# Patient Record
Sex: Female | Born: 1935 | Race: White | Hispanic: No | Marital: Single | Smoking: Former smoker
Health system: Southern US, Community
[De-identification: ages and names within clinical notes are randomized; demographics above are authoritative.]

## PROBLEM LIST (undated history)

## (undated) DIAGNOSIS — D649 Anemia, unspecified: Secondary | ICD-10-CM

## (undated) DIAGNOSIS — IMO0001 Reserved for inherently not codable concepts without codable children: Secondary | ICD-10-CM

## (undated) DIAGNOSIS — E785 Hyperlipidemia, unspecified: Secondary | ICD-10-CM

## (undated) DIAGNOSIS — K922 Gastrointestinal hemorrhage, unspecified: Secondary | ICD-10-CM

## (undated) DIAGNOSIS — E7800 Pure hypercholesterolemia, unspecified: Secondary | ICD-10-CM

## (undated) DIAGNOSIS — I714 Abdominal aortic aneurysm, without rupture, unspecified: Secondary | ICD-10-CM

## (undated) DIAGNOSIS — H409 Unspecified glaucoma: Secondary | ICD-10-CM

## (undated) DIAGNOSIS — R238 Other skin changes: Secondary | ICD-10-CM

## (undated) DIAGNOSIS — F32A Depression, unspecified: Secondary | ICD-10-CM

## (undated) DIAGNOSIS — M1990 Unspecified osteoarthritis, unspecified site: Secondary | ICD-10-CM

## (undated) DIAGNOSIS — K2970 Gastritis, unspecified, without bleeding: Secondary | ICD-10-CM

## (undated) DIAGNOSIS — G629 Polyneuropathy, unspecified: Secondary | ICD-10-CM

## (undated) DIAGNOSIS — I10 Essential (primary) hypertension: Secondary | ICD-10-CM

## (undated) DIAGNOSIS — J84112 Idiopathic pulmonary fibrosis: Secondary | ICD-10-CM

## (undated) DIAGNOSIS — R4702 Dysphasia: Secondary | ICD-10-CM

## (undated) DIAGNOSIS — R51 Headache: Secondary | ICD-10-CM

## (undated) DIAGNOSIS — H269 Unspecified cataract: Secondary | ICD-10-CM

## (undated) DIAGNOSIS — K559 Vascular disorder of intestine, unspecified: Secondary | ICD-10-CM

## (undated) DIAGNOSIS — I639 Cerebral infarction, unspecified: Secondary | ICD-10-CM

## (undated) DIAGNOSIS — J449 Chronic obstructive pulmonary disease, unspecified: Secondary | ICD-10-CM

## (undated) DIAGNOSIS — N189 Chronic kidney disease, unspecified: Secondary | ICD-10-CM

## (undated) DIAGNOSIS — R233 Spontaneous ecchymoses: Secondary | ICD-10-CM

## (undated) DIAGNOSIS — K869 Disease of pancreas, unspecified: Secondary | ICD-10-CM

## (undated) DIAGNOSIS — F329 Major depressive disorder, single episode, unspecified: Secondary | ICD-10-CM

## (undated) DIAGNOSIS — I2510 Atherosclerotic heart disease of native coronary artery without angina pectoris: Secondary | ICD-10-CM

## (undated) DIAGNOSIS — K220 Achalasia of cardia: Secondary | ICD-10-CM

## (undated) DIAGNOSIS — M792 Neuralgia and neuritis, unspecified: Secondary | ICD-10-CM

## (undated) DIAGNOSIS — J189 Pneumonia, unspecified organism: Secondary | ICD-10-CM

## (undated) DIAGNOSIS — G43909 Migraine, unspecified, not intractable, without status migrainosus: Secondary | ICD-10-CM

## (undated) DIAGNOSIS — F419 Anxiety disorder, unspecified: Secondary | ICD-10-CM

## (undated) DIAGNOSIS — G459 Transient cerebral ischemic attack, unspecified: Secondary | ICD-10-CM

## (undated) DIAGNOSIS — H9190 Unspecified hearing loss, unspecified ear: Secondary | ICD-10-CM

## (undated) HISTORY — PX: TONSILLECTOMY: SUR1361

## (undated) HISTORY — DX: Hyperlipidemia, unspecified: E78.5

## (undated) HISTORY — DX: Transient cerebral ischemic attack, unspecified: G45.9

## (undated) HISTORY — DX: Essential (primary) hypertension: I10

## (undated) HISTORY — DX: Abdominal aortic aneurysm, without rupture: I71.4

## (undated) HISTORY — DX: Neuralgia and neuritis, unspecified: M79.2

## (undated) HISTORY — DX: Gastritis, unspecified, without bleeding: K29.70

## (undated) HISTORY — PX: DILATION AND CURETTAGE OF UTERUS: SHX78

## (undated) HISTORY — PX: CAROTID ENDARTERECTOMY: SUR193

## (undated) HISTORY — DX: Abdominal aortic aneurysm, without rupture, unspecified: I71.40

## (undated) HISTORY — DX: Anemia, unspecified: D64.9

## (undated) HISTORY — DX: Gastrointestinal hemorrhage, unspecified: K92.2

## (undated) HISTORY — DX: Vascular disorder of intestine, unspecified: K55.9

## (undated) HISTORY — DX: Unspecified osteoarthritis, unspecified site: M19.90

---
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
## 1973-06-16 HISTORY — PX: TUBAL LIGATION: SHX77

## 1982-06-16 HISTORY — PX: ABDOMINAL HYSTERECTOMY: SHX81

## 1998-03-06 ENCOUNTER — Other Ambulatory Visit: Admission: RE | Admit: 1998-03-06 | Discharge: 1998-03-06 | Payer: Self-pay | Admitting: Gynecology

## 1998-06-26 ENCOUNTER — Inpatient Hospital Stay (HOSPITAL_COMMUNITY): Admission: EM | Admit: 1998-06-26 | Discharge: 1998-07-02 | Payer: Self-pay | Admitting: Emergency Medicine

## 1998-08-27 ENCOUNTER — Encounter: Payer: Self-pay | Admitting: Vascular Surgery

## 1998-08-27 ENCOUNTER — Ambulatory Visit (HOSPITAL_COMMUNITY): Admission: RE | Admit: 1998-08-27 | Discharge: 1998-08-27 | Payer: Self-pay | Admitting: Vascular Surgery

## 1998-08-30 ENCOUNTER — Inpatient Hospital Stay: Admission: RE | Admit: 1998-08-30 | Discharge: 1998-08-31 | Payer: Self-pay | Admitting: Vascular Surgery

## 1999-04-15 ENCOUNTER — Encounter: Admission: RE | Admit: 1999-04-15 | Discharge: 1999-04-15 | Payer: Self-pay | Admitting: Neurosurgery

## 1999-04-16 ENCOUNTER — Encounter: Payer: Self-pay | Admitting: Vascular Surgery

## 1999-04-17 ENCOUNTER — Encounter (INDEPENDENT_AMBULATORY_CARE_PROVIDER_SITE_OTHER): Payer: Self-pay | Admitting: Specialist

## 1999-04-17 ENCOUNTER — Inpatient Hospital Stay: Admission: RE | Admit: 1999-04-17 | Discharge: 1999-04-19 | Payer: Self-pay | Admitting: Vascular Surgery

## 1999-09-06 ENCOUNTER — Other Ambulatory Visit: Admission: RE | Admit: 1999-09-06 | Discharge: 1999-09-06 | Payer: Self-pay | Admitting: Gynecology

## 1999-10-03 ENCOUNTER — Encounter: Payer: Self-pay | Admitting: Neurology

## 1999-10-03 ENCOUNTER — Ambulatory Visit (HOSPITAL_COMMUNITY): Admission: RE | Admit: 1999-10-03 | Discharge: 1999-10-03 | Payer: Self-pay | Admitting: Neurology

## 2000-06-16 HISTORY — PX: COLON SURGERY: SHX602

## 2000-07-05 ENCOUNTER — Encounter: Payer: Self-pay | Admitting: Family Medicine

## 2000-07-05 ENCOUNTER — Encounter (INDEPENDENT_AMBULATORY_CARE_PROVIDER_SITE_OTHER): Payer: Self-pay | Admitting: Specialist

## 2000-07-05 ENCOUNTER — Inpatient Hospital Stay (HOSPITAL_COMMUNITY): Admission: EM | Admit: 2000-07-05 | Discharge: 2000-07-23 | Payer: Self-pay | Admitting: Emergency Medicine

## 2000-07-15 ENCOUNTER — Encounter: Payer: Self-pay | Admitting: General Surgery

## 2000-07-18 ENCOUNTER — Encounter: Payer: Self-pay | Admitting: Internal Medicine

## 2000-07-23 ENCOUNTER — Inpatient Hospital Stay: Admission: RE | Admit: 2000-07-23 | Discharge: 2000-07-30 | Payer: Self-pay | Admitting: Internal Medicine

## 2000-07-27 ENCOUNTER — Ambulatory Visit (HOSPITAL_COMMUNITY): Admission: RE | Admit: 2000-07-27 | Discharge: 2000-07-27 | Payer: Self-pay | Admitting: Internal Medicine

## 2000-07-27 ENCOUNTER — Encounter: Payer: Self-pay | Admitting: Orthopedic Surgery

## 2000-11-17 ENCOUNTER — Other Ambulatory Visit: Admission: RE | Admit: 2000-11-17 | Discharge: 2000-11-17 | Payer: Self-pay | Admitting: Gynecology

## 2002-04-22 ENCOUNTER — Other Ambulatory Visit: Admission: RE | Admit: 2002-04-22 | Discharge: 2002-04-22 | Payer: Self-pay | Admitting: Internal Medicine

## 2003-10-03 ENCOUNTER — Other Ambulatory Visit: Admission: RE | Admit: 2003-10-03 | Discharge: 2003-10-03 | Payer: Self-pay | Admitting: Gynecology

## 2004-01-05 ENCOUNTER — Other Ambulatory Visit: Admission: RE | Admit: 2004-01-05 | Discharge: 2004-01-05 | Payer: Self-pay | Admitting: Gynecology

## 2004-10-03 ENCOUNTER — Encounter: Admission: RE | Admit: 2004-10-03 | Discharge: 2004-10-03 | Payer: Self-pay | Admitting: Family Medicine

## 2004-11-08 ENCOUNTER — Inpatient Hospital Stay (HOSPITAL_COMMUNITY): Admission: EM | Admit: 2004-11-08 | Discharge: 2004-11-22 | Payer: Self-pay | Admitting: Emergency Medicine

## 2005-03-04 ENCOUNTER — Other Ambulatory Visit: Admission: RE | Admit: 2005-03-04 | Discharge: 2005-03-04 | Payer: Self-pay | Admitting: Gynecology

## 2005-10-28 ENCOUNTER — Encounter: Admission: RE | Admit: 2005-10-28 | Discharge: 2005-10-28 | Payer: Self-pay | Admitting: Family Medicine

## 2006-05-26 ENCOUNTER — Other Ambulatory Visit: Admission: RE | Admit: 2006-05-26 | Discharge: 2006-05-26 | Payer: Self-pay | Admitting: Gynecology

## 2006-11-27 ENCOUNTER — Encounter: Admission: RE | Admit: 2006-11-27 | Discharge: 2006-11-27 | Payer: Self-pay | Admitting: Family Medicine

## 2007-12-16 ENCOUNTER — Inpatient Hospital Stay (HOSPITAL_COMMUNITY): Admission: EM | Admit: 2007-12-16 | Discharge: 2007-12-21 | Payer: Self-pay | Admitting: Emergency Medicine

## 2007-12-17 ENCOUNTER — Ambulatory Visit: Payer: Self-pay | Admitting: Physical Medicine & Rehabilitation

## 2008-02-15 HISTORY — PX: FRACTURE SURGERY: SHX138

## 2008-02-28 ENCOUNTER — Inpatient Hospital Stay (HOSPITAL_COMMUNITY): Admission: RE | Admit: 2008-02-28 | Discharge: 2008-03-02 | Payer: Self-pay | Admitting: Orthopedic Surgery

## 2008-03-16 HISTORY — PX: FRACTURE SURGERY: SHX138

## 2008-04-04 ENCOUNTER — Inpatient Hospital Stay (HOSPITAL_COMMUNITY): Admission: EM | Admit: 2008-04-04 | Discharge: 2008-04-12 | Payer: Self-pay | Admitting: Emergency Medicine

## 2008-04-11 ENCOUNTER — Ambulatory Visit: Payer: Self-pay | Admitting: Physical Medicine & Rehabilitation

## 2008-04-12 ENCOUNTER — Ambulatory Visit: Payer: Self-pay | Admitting: Physical Medicine & Rehabilitation

## 2008-04-12 ENCOUNTER — Inpatient Hospital Stay (HOSPITAL_COMMUNITY)
Admission: RE | Admit: 2008-04-12 | Discharge: 2008-04-25 | Payer: Self-pay | Admitting: Physical Medicine & Rehabilitation

## 2008-04-16 HISTORY — PX: FRACTURE SURGERY: SHX138

## 2008-08-18 ENCOUNTER — Inpatient Hospital Stay (HOSPITAL_COMMUNITY): Admission: EM | Admit: 2008-08-18 | Discharge: 2008-08-21 | Payer: Self-pay | Admitting: Emergency Medicine

## 2009-04-09 ENCOUNTER — Encounter: Admission: RE | Admit: 2009-04-09 | Discharge: 2009-04-09 | Payer: Self-pay | Admitting: Family Medicine

## 2010-04-19 ENCOUNTER — Encounter: Admission: RE | Admit: 2010-04-19 | Discharge: 2010-04-19 | Payer: Self-pay | Admitting: Family Medicine

## 2010-05-03 ENCOUNTER — Ambulatory Visit: Payer: Self-pay | Admitting: Vascular Surgery

## 2010-07-18 ENCOUNTER — Emergency Department (HOSPITAL_COMMUNITY): Payer: Medicare Other

## 2010-07-18 ENCOUNTER — Emergency Department (HOSPITAL_COMMUNITY)
Admission: EM | Admit: 2010-07-18 | Discharge: 2010-07-18 | Disposition: A | Discharge status: Home / Self Care | Payer: Medicare Other | Attending: Emergency Medicine | Admitting: Emergency Medicine

## 2010-07-18 ENCOUNTER — Encounter (HOSPITAL_COMMUNITY): Payer: Self-pay | Admitting: Radiology

## 2010-07-18 DIAGNOSIS — I714 Abdominal aortic aneurysm, without rupture, unspecified: Secondary | ICD-10-CM | POA: Insufficient documentation

## 2010-07-18 DIAGNOSIS — H538 Other visual disturbances: Secondary | ICD-10-CM | POA: Insufficient documentation

## 2010-07-18 DIAGNOSIS — I10 Essential (primary) hypertension: Secondary | ICD-10-CM | POA: Insufficient documentation

## 2010-07-18 DIAGNOSIS — Z8679 Personal history of other diseases of the circulatory system: Secondary | ICD-10-CM | POA: Insufficient documentation

## 2010-07-18 DIAGNOSIS — N289 Disorder of kidney and ureter, unspecified: Secondary | ICD-10-CM | POA: Insufficient documentation

## 2010-07-18 DIAGNOSIS — R109 Unspecified abdominal pain: Secondary | ICD-10-CM | POA: Insufficient documentation

## 2010-07-18 DIAGNOSIS — R51 Headache: Secondary | ICD-10-CM | POA: Insufficient documentation

## 2010-07-18 DIAGNOSIS — R1013 Epigastric pain: Secondary | ICD-10-CM | POA: Insufficient documentation

## 2010-07-18 LAB — POCT I-STAT, CHEM 8
Calcium, Ion: 0.96 mmol/L — ABNORMAL LOW (ref 1.12–1.32)
Creatinine, Ser: 1.1 mg/dL (ref 0.4–1.2)
Hemoglobin: 13.6 g/dL (ref 12.0–15.0)
Sodium: 132 mEq/L — ABNORMAL LOW (ref 135–145)
TCO2: 24 mmol/L (ref 0–100)

## 2010-07-18 LAB — DIFFERENTIAL
Basophils Absolute: 0.1 10*3/uL (ref 0.0–0.1)
Eosinophils Absolute: 0.1 10*3/uL (ref 0.0–0.7)
Lymphocytes Relative: 40 % (ref 12–46)
Lymphs Abs: 2.5 10*3/uL (ref 0.7–4.0)
Monocytes Absolute: 0.7 10*3/uL (ref 0.1–1.0)
Neutro Abs: 2.8 10*3/uL (ref 1.7–7.7)

## 2010-07-18 LAB — COMPREHENSIVE METABOLIC PANEL
ALT: 22 U/L (ref 0–35)
AST: 34 U/L (ref 0–37)
Alkaline Phosphatase: 86 U/L (ref 39–117)
CO2: 23 mEq/L (ref 19–32)
GFR calc Af Amer: 28 mL/min — ABNORMAL LOW (ref >60)
GFR calc non Af Amer: 23 mL/min — ABNORMAL LOW (ref >60)
Glucose, Bld: 92 mg/dL (ref 70–99)
Potassium: 4.9 mEq/L (ref 3.5–5.1)
Sodium: 135 mEq/L (ref 135–145)

## 2010-07-18 LAB — URINALYSIS, ROUTINE W REFLEX MICROSCOPIC
Bilirubin Urine: NEGATIVE
Ketones, ur: 15 mg/dL — AB
Nitrite: NEGATIVE
Protein, ur: 30 mg/dL — AB
Urine Glucose, Fasting: NEGATIVE mg/dL

## 2010-07-18 LAB — CBC
MCH: 29.4 pg (ref 26.0–34.0)
MCHC: 33.1 g/dL (ref 30.0–36.0)
Platelets: 150 10*3/uL (ref 150–400)
RDW: 15.7 % — ABNORMAL HIGH (ref 11.5–15.5)

## 2010-07-18 LAB — URINE MICROSCOPIC-ADD ON

## 2010-07-18 LAB — POCT CARDIAC MARKERS

## 2010-07-18 MED ORDER — IOHEXOL 300 MG/ML  SOLN: 100.0000 mL | Freq: Once | INTRAMUSCULAR | Status: DC | PRN

## 2010-08-15 ENCOUNTER — Emergency Department (HOSPITAL_COMMUNITY)
Admission: EM | Admit: 2010-08-15 | Discharge: 2010-08-15 | Disposition: A | Discharge status: Home / Self Care | Payer: Medicare Other | Attending: Emergency Medicine | Admitting: Emergency Medicine

## 2010-08-15 DIAGNOSIS — I10 Essential (primary) hypertension: Secondary | ICD-10-CM | POA: Insufficient documentation

## 2010-08-15 DIAGNOSIS — R197 Diarrhea, unspecified: Secondary | ICD-10-CM | POA: Insufficient documentation

## 2010-08-15 DIAGNOSIS — Z8673 Personal history of transient ischemic attack (TIA), and cerebral infarction without residual deficits: Secondary | ICD-10-CM | POA: Insufficient documentation

## 2010-08-15 DIAGNOSIS — R209 Unspecified disturbances of skin sensation: Secondary | ICD-10-CM | POA: Insufficient documentation

## 2010-08-15 DIAGNOSIS — R29898 Other symptoms and signs involving the musculoskeletal system: Secondary | ICD-10-CM | POA: Insufficient documentation

## 2010-08-15 LAB — DIFFERENTIAL
Basophils Relative: 0 % (ref 0–1)
Monocytes Relative: 8 % (ref 3–12)
Neutro Abs: 2.9 10*3/uL (ref 1.7–7.7)
Neutrophils Relative %: 38 % — ABNORMAL LOW (ref 43–77)

## 2010-08-15 LAB — COMPREHENSIVE METABOLIC PANEL
ALT: 28 U/L (ref 0–35)
AST: 28 U/L (ref 0–37)
Alkaline Phosphatase: 103 U/L (ref 39–117)
CO2: 28 mEq/L (ref 19–32)
Chloride: 104 mEq/L (ref 96–112)
Creatinine, Ser: 1.49 mg/dL — ABNORMAL HIGH (ref 0.4–1.2)
GFR calc Af Amer: 41 mL/min — ABNORMAL LOW (ref >60)
GFR calc non Af Amer: 34 mL/min — ABNORMAL LOW (ref >60)
Potassium: 4.4 mEq/L (ref 3.5–5.1)
Sodium: 140 mEq/L (ref 135–145)
Total Bilirubin: 0.5 mg/dL (ref 0.3–1.2)

## 2010-08-15 LAB — CBC
Hemoglobin: 15 g/dL (ref 12.0–15.0)
MCH: 29.7 pg (ref 26.0–34.0)
RBC: 5.05 MIL/uL (ref 3.87–5.11)
WBC: 7.6 10*3/uL (ref 4.0–10.5)

## 2010-09-03 ENCOUNTER — Emergency Department (HOSPITAL_COMMUNITY)
Admission: EM | Admit: 2010-09-03 | Discharge: 2010-09-03 | Disposition: A | Discharge status: Home / Self Care | Payer: Medicare Other | Attending: Emergency Medicine | Admitting: Emergency Medicine

## 2010-09-03 DIAGNOSIS — Z8673 Personal history of transient ischemic attack (TIA), and cerebral infarction without residual deficits: Secondary | ICD-10-CM | POA: Insufficient documentation

## 2010-09-03 DIAGNOSIS — Z79899 Other long term (current) drug therapy: Secondary | ICD-10-CM | POA: Insufficient documentation

## 2010-09-03 DIAGNOSIS — R55 Syncope and collapse: Secondary | ICD-10-CM | POA: Insufficient documentation

## 2010-09-03 DIAGNOSIS — I10 Essential (primary) hypertension: Secondary | ICD-10-CM | POA: Insufficient documentation

## 2010-09-03 DIAGNOSIS — R42 Dizziness and giddiness: Secondary | ICD-10-CM | POA: Insufficient documentation

## 2010-09-03 LAB — POCT I-STAT, CHEM 8
Calcium, Ion: 1.1 mmol/L — ABNORMAL LOW (ref 1.12–1.32)
Glucose, Bld: 110 mg/dL — ABNORMAL HIGH (ref 70–99)
HCT: 45 % (ref 36.0–46.0)
Hemoglobin: 15.3 g/dL — ABNORMAL HIGH (ref 12.0–15.0)
TCO2: 26 mmol/L (ref 0–100)

## 2010-09-03 LAB — POCT CARDIAC MARKERS

## 2010-09-17 ENCOUNTER — Other Ambulatory Visit: Payer: Self-pay | Admitting: Gastroenterology

## 2010-09-26 ENCOUNTER — Encounter: Payer: Self-pay | Admitting: Vascular Surgery

## 2010-09-26 LAB — PROTIME-INR
INR: 1 (ref 0.00–1.49)
Prothrombin Time: 12.9 seconds (ref 11.6–15.2)

## 2010-09-26 LAB — BASIC METABOLIC PANEL
BUN: 11 mg/dL (ref 6–23)
BUN: 14 mg/dL (ref 6–23)
BUN: 16 mg/dL (ref 6–23)
BUN: 18 mg/dL (ref 6–23)
CO2: 24 mEq/L (ref 19–32)
Calcium: 8.5 mg/dL (ref 8.4–10.5)
Chloride: 101 mEq/L (ref 96–112)
Creatinine, Ser: 1.09 mg/dL (ref 0.4–1.2)
Creatinine, Ser: 1.23 mg/dL — ABNORMAL HIGH (ref 0.4–1.2)
GFR calc Af Amer: 52 mL/min — ABNORMAL LOW (ref >60)
GFR calc non Af Amer: 43 mL/min — ABNORMAL LOW (ref >60)
GFR calc non Af Amer: 49 mL/min — ABNORMAL LOW (ref >60)
Glucose, Bld: 115 mg/dL — ABNORMAL HIGH (ref 70–99)
Glucose, Bld: 71 mg/dL (ref 70–99)
Glucose, Bld: 80 mg/dL (ref 70–99)
Potassium: 4.1 mEq/L (ref 3.5–5.1)
Potassium: 4.5 mEq/L (ref 3.5–5.1)
Potassium: 4.6 mEq/L (ref 3.5–5.1)
Sodium: 135 mEq/L (ref 135–145)

## 2010-09-26 LAB — URINALYSIS, ROUTINE W REFLEX MICROSCOPIC
Glucose, UA: NEGATIVE mg/dL
Hgb urine dipstick: NEGATIVE
Leukocytes, UA: NEGATIVE
Protein, ur: 30 mg/dL — AB
Specific Gravity, Urine: 1.018 (ref 1.005–1.030)
pH: 8 (ref 5.0–8.0)

## 2010-09-26 LAB — POCT I-STAT, CHEM 8
Calcium, Ion: 1.22 mmol/L (ref 1.12–1.32)
Creatinine, Ser: 1.2 mg/dL (ref 0.4–1.2)
Glucose, Bld: 123 mg/dL — ABNORMAL HIGH (ref 70–99)
HCT: 49 % — ABNORMAL HIGH (ref 36.0–46.0)
Hemoglobin: 16.7 g/dL — ABNORMAL HIGH (ref 12.0–15.0)
Potassium: 5.4 mEq/L — ABNORMAL HIGH (ref 3.5–5.1)
TCO2: 32 mmol/L (ref 0–100)

## 2010-09-26 LAB — URINE MICROSCOPIC-ADD ON

## 2010-09-26 LAB — DIFFERENTIAL
Basophils Absolute: 0 10*3/uL (ref 0.0–0.1)
Basophils Absolute: 0 10*3/uL (ref 0.0–0.1)
Basophils Relative: 0 % (ref 0–1)
Eosinophils Absolute: 0.1 10*3/uL (ref 0.0–0.7)
Eosinophils Relative: 1 % (ref 0–5)
Lymphocytes Relative: 16 % (ref 12–46)
Lymphocytes Relative: 17 % (ref 12–46)
Monocytes Absolute: 0.8 10*3/uL (ref 0.1–1.0)
Neutro Abs: 10.7 10*3/uL — ABNORMAL HIGH (ref 1.7–7.7)
Neutrophils Relative %: 76 % (ref 43–77)
Neutrophils Relative %: 77 % (ref 43–77)

## 2010-09-26 LAB — CBC
HCT: 43.8 % (ref 36.0–46.0)
HCT: 45.8 % (ref 36.0–46.0)
Hemoglobin: 15.4 g/dL — ABNORMAL HIGH (ref 12.0–15.0)
MCHC: 33.9 g/dL (ref 30.0–36.0)
MCV: 86.8 fL (ref 78.0–100.0)
Platelets: 296 10*3/uL (ref 150–400)
Platelets: 314 10*3/uL (ref 150–400)
RDW: 16.1 % — ABNORMAL HIGH (ref 11.5–15.5)
RDW: 16.3 % — ABNORMAL HIGH (ref 11.5–15.5)
WBC: 11.9 10*3/uL — ABNORMAL HIGH (ref 4.0–10.5)
WBC: 11.9 10*3/uL — ABNORMAL HIGH (ref 4.0–10.5)

## 2010-09-26 LAB — COMPREHENSIVE METABOLIC PANEL
Albumin: 3.3 g/dL — ABNORMAL LOW (ref 3.5–5.2)
BUN: 12 mg/dL (ref 6–23)
Chloride: 100 mEq/L (ref 96–112)
Creatinine, Ser: 1.18 mg/dL (ref 0.4–1.2)
Glucose, Bld: 133 mg/dL — ABNORMAL HIGH (ref 70–99)
Total Bilirubin: 0.6 mg/dL (ref 0.3–1.2)
Total Protein: 6.2 g/dL (ref 6.0–8.3)

## 2010-09-26 LAB — URINE CULTURE: Colony Count: NO GROWTH

## 2010-09-26 LAB — LACTIC ACID, PLASMA: Lactic Acid, Venous: 1.6 mmol/L (ref 0.5–2.2)

## 2010-09-26 LAB — APTT: aPTT: 28 seconds (ref 24–37)

## 2010-10-25 ENCOUNTER — Ambulatory Visit: Payer: Self-pay | Admitting: Vascular Surgery

## 2010-10-25 ENCOUNTER — Other Ambulatory Visit: Payer: Self-pay

## 2010-10-29 NOTE — Discharge Summary (Signed)
[name], [name]                 ACCOUNT NO.:  [id number]   MEDICAL RECORD NO.:  [id number]          [name] TYPE:  INP   LOCATION:  5003                         FACILITY:  [name]   PHYSICIAN:  [name] [name], M.D.   DATE OF BIRTH:  Dec 04, 1933   DATE OF ADMISSION:  02/28/2008  DATE OF DISCHARGE:  03/02/2008                               DISCHARGE SUMMARY   CHIEF COMPLAINT:  Left hip pain.   HISTORY OF PRESENT ILLNESS:  This is a 77 year old lady who fell in July  of this year and sustained a left hip fracture.  She underwent an open  reduction and internal fixation of [name] fracture, but has had nonunion  and complains of persistent left hip pain.  She desires a surgical  intervention at this time, all risks and benefits of surgery was  discussed with [name] [name].   PAST MEDICAL HISTORY:  Significant for hypertension and a subdural  hematoma in 2000.   PAST SURGICAL HISTORY:  Significant for no ORIF of [name] left hip fracture  in July 2009.  She had a partial colectomy in 2002 and a left  nephrectomy hysterectomy at age 39.   SOCIAL HISTORY:  She does not drink alcohol.  She smokes one cigarettes  a day.   FAMILY HISTORY:  Noncontributory.   ALLERGIES:  PENICILLIN, MACROLIDE, CODEINE.   CURRENT MEDICATIONS:  1. Aspirin 325 mg one p.o. daily.  2. Zyrtec 10 mg one p.o. daily Tri-Chlor 160 mg one p.o. daily Lyrica      75 mg one p.o. b.i.d. Lopressor 50 mg one p.o. b.i.d. Toprol,      Lipitor 20 mg daily, Robaxin 500 mg one p.o. q.6 h p.r.n. spasm.   PHYSICAL EXAMINATION:  Examination of [name] left hip demonstrates [name]  [name] to have tenderness with range of motion and positive foot tap.  She is neurovascularly intact.  X-rays demonstrate a migration of [name]  nail through [name] subcondylar bone and a nonunion of [name] hip fracture.   PREOPERATIVE LABORATORIES:  White blood cells 9.3, red blood cells 4.64,  hemoglobin 13.7, hematocrit 41.5, platelets 357.  PT 12.8, INR 1.0, PTT  26.   Sodium 139, potassium 4.4, chloride 103, glucose 56, BUN 23,  creatinine 1.81.  Urinalysis demonstrated moderate leukocytes, but is  otherwise within normal limits.   HOSPITAL COURSE:  [name] [name] was admitted February 28, 2008, to [name], and she underwent left total hip arthroplasty after removal of  her previously placed hardware.  Perioperative Foley catheter was placed  and she was transferred to [name].  She was maintained on  her home aspirin for DVT prophylaxis.  Later that day, her hemoglobin  was found to be at 8.3, so she was transfused with 2 units of packed red  blood cells.  On [name] first postoperative day, [name] [name] was awake and  alert.  Her hemoglobin had increased to 10.8 after [name] transfusion.  Her  potassium was 5.7.  Her IV fluids were continued, and her potassium was  rechecked 8 hours later and found to be 4.6.  [name] [name] was evaluated  by physical therapy.  On [name] second postoperative day, [name] [name] was  awake and alert and denied any nausea or vomiting.  She was complaining  of muscle spasms in her left side that were relieved with Robaxin.  Hemoglobin was 9.6, her potassium was 4.6.  [name] [name] worked on bed to  chair transfers with physical therapy.  On postoperative day #3, [name]  [name] was awake and alert and tolerating p.o. intake well.  She was  working with physical therapy.  Her hemoglobin was 9.9.  She was  discharged to [name] for skilled nursing care.   DISPOSITION:  [name] [name] will be discharged to [name]  on March 02, 2008.  Skilled nursing will manage her wound and her  physical therapy.  She will continue taking her home aspirin for DVT  prophylaxis.   DISCHARGE MEDICINES:  As per [name] __________with [name] addition of Percocet  5 mg tablets 1-2 tablets p.o. q.4 h p.r.n. pain.  She will be  weightbearing as tolerated with a walker, will return to [name] clinic to  see Dr. Turner [name] in one  week for reevaluation.   FINAL DIAGNOSIS:  A failed left hip open reduction and internal  fixation.   SECONDARY DIAGNOSES:  Acute blood loss anemia and hyperkalemia.      [name] Harris, PA      [name] [name], M.D.  Electronically Signed    JW/MEDQ  D:  03/02/2008  T:  03/02/2008  Job:  [id number]

## 2010-10-29 NOTE — Discharge Summary (Signed)
[name]                 ACCOUNT NO.:  [id number]   MEDICAL RECORD NO.:  [id number]          PATIENT TYPE:  INP   LOCATION:  1608                         FACILITY:  [facility]   PHYSICIAN:  Feliberto Gottron. Turner Daniels, M.D.   DATE OF BIRTH:  18-Jun-1933   DATE OF ADMISSION:  12/16/2007  DATE OF DISCHARGE:  12/21/2007                               DISCHARGE SUMMARY   CHIEF COMPLAINT:  Left hip pain.   HISTORY OF PRESENT ILLNESS:  This is a 77 year old lady who sustained a  left hip fracture after falling in her home on the afternoon of July 2.  The fall was likely secondary to chronic neuropathy in her lower  extremities.  She was evaluated at the [facility] emergency room by Dr.  Turner Daniels and determined to need surgery, so she was admitted for this.   PAST MEDICAL HISTORY:  1. Significant for hypertension.  2. Restless legs syndrome  3. She had a subdural hematoma in 2000.   FAMILY HISTORY:  Noncontributory.   SOCIAL HISTORY:  She does not drink alcohol.  She is a nonsmoker.  She  lives with a friend.   CURRENT MEDICATIONS:  1. Lipitor 20 mg 1 p.o. daily.  2. Imipramine 25 mg 1 p.o. nightly.  3. Lyrica 75 mg 1 p.o. b.i.d.  4. Metoprolol 50 mg 1 p.o. b.i.d.  5. Zyrtec 1 p.o. daily.  6. Aspirin 1 p.o. daily.   DRUG ALLERGIES:  PENICILLIN, MACROLIDES, CODEINE and MYCINS.   PHYSICAL EXAM:  Guarded examination of the left lower extremity  demonstrated this extremity to be shorter and externally rotated with  decreased sensation to light touch.   X-rays taken of the left hip demonstrate an angulated fracture that is  intertrochanteric, subtrochanteric.   LABORATORY DATA:  Sodium 142, potassium 4.8, chloride 103, carbon  dioxide 32, glucose 110, BUN 22, creatinine 1.29.  White blood cells  9.1, red blood cells 5.08, hemoglobin 15.6, hematocrit 47, platelets  223.   HOSPITAL COURSE:  [name] was admitted through the [facility] ER on the  evening of December 16, 2007, after being evaluated  by Dr. Gean Birchwood and  determined to be a candidate for surgery.  That evening she underwent an  open reduction and internal fixation of a left subtrochanteric and  intertrochanteric fracture using a DePuy trochanteric nail.  She  tolerated this procedure well and was admitted to the hospital.  Throughout her hospital stay [name] was managed with aspirin to prevent  blood clots due to her history of a subdural hematoma.  On the first  postoperative day the patient denied any chest pain or shortness of  breath.  She stated that she was taking p.o. fluids well and reported  moderate hip pain.  She moved from the bed to the recliner with physical  therapy.  On the second postoperative day the patient continued to  tolerate p.o. intake well and worked on transferring from the walker to  the bed and the bed to the chair with physical therapy.  On the third  postoperative day the patient continued  to work well with physical  therapy.  She was eating well and continued to deny any chest pain or  shortness of breath.  On the fourth postoperative day the patient  reported minimal hip pain at rest but stated that it became more  significant with ambulation.  She again denied shortness of breath or  chest pain.  No swelling was noted in the lower extremity.  Her Foley  catheter was removed.  On the fifth postoperative day the patient was  doing very well.  She was tolerating p.o. intake well and worked well  with physical therapy, so she was discharged to a skilled nursing  facility for rehab.   DISPOSITION:  The patient will be discharged to a skilled nursing  facility on December 21, 2007.  The facility will manage her wound and her  physical therapy.  She will be partial weightbearing approximately 30  pounds with a walker.  Her discharge medicines will be as per the HMR.  She will continue using her daily aspirin for blood clot prevention.  She will return to the clinic to see Dr. Turner Daniels in 1  week.   FINAL DIAGNOSIS:  Left hip fracture.      Shirl Harris, PA      Feliberto Gottron. Turner Daniels, M.D.  Electronically Signed    JW/MEDQ  D:  12/21/2007  T:  12/21/2007  Job:  [id number]

## 2010-10-29 NOTE — Consult Note (Signed)
[name], [name]                 ACCOUNT NO.:  [id number]   MEDICAL RECORD NO.:  [id number]          PATIENT TYPE:  INP   LOCATION:  5015                         FACILITY:  [facility]   PHYSICIAN:  Corinna L. Lendell Caprice, MDDATE OF BIRTH:  06-23-1933   DATE OF CONSULTATION:  04/06/2008  DATE OF DISCHARGE:                                 CONSULTATION   REQUESTING PHYSICIAN:  Harvie Junior, MD   REASON FOR CONSULTATION:  Cardiac and medical issues   IMPRESSION/RECOMMENDATIONS:  1. Left bundle-branch block, old:  No further workup needed at this      time.  The patient does not have a history of coronary artery      disease and has no chest pain currently.  2. Hypertension:  Resume metoprolol.  There is some confusion about      her dose and the patient's son will bring in her home medications      for clarification.  According to discharge summary on March 02, 2008, she was taking metoprolol 50 mg twice a day.  3. Hyperlipidemia:  Resume Lipitor.  4. Peripheral neuropathy:  Resume Lyrica.  5. Restless leg syndrome.  6. History of subdural hematoma and subarachnoid hemorrhage in 2000.  7. Left distal femur fracture status post open reduction and internal      fixation:  Agree with DVT prophylaxis.   HISTORY OF PRESENT ILLNESS:  [name] is a pleasant 77 year old white  female patient of Dr. Arvilla Market who fell and broke her leg.  She had  surgery on April 05, 2008, and we were consulted postoperatively to  assist with medical issues.  She was noted to have a left bundle-branch  block on her EKG.  Currently, she has no nausea, her pain is well  controlled, she has no shortness of breath or chest pain.   PAST MEDICAL HISTORY:  As above.   MEDICATIONS:  1. Tylenol 650 mg p.o. q.i.d.  2. Colace 100 mg p.o. b.i.d.  3. Iron b.i.d.  4. Warfarin per pharmacy protocol.  5. Metoprolol 25 mg a day.  6. Claritin 10 mg a day.  7. Vancomycin for 24 hours.  8. Morphine PCA.   ALLERGIES:  Reportedly CODEINE, PENICILLIN and MACROLIDES cause rash.   SOCIAL HISTORY:  The patient does not smoke.  No history of heavy  drinking.   Family history is noncontributory.   REVIEW OF SYSTEMS:  As above, otherwise, negative.   PHYSICAL EXAMINATION:  VITAL SIGNS:  Her temperature is 98.1, pulse 99,  respiratory rate 18, blood pressure 145/73, oxygen saturation 93% on 3 L  nasal cannula.  GENERAL:  The patient is a sleepy white female in no acute distress.  HEENT:  Normocephalic, atraumatic.  Pupils equal, round, and reactive to  light.  Dry mucous membranes.  NECK:  Supple.  LUNGS:  Clear to auscultation bilaterally without wheezes, rhonchi, or  rales.  CARDIOVASCULAR:  Regular rate and rhythm without murmurs, gallops, or  rubs.  ABDOMEN:  Normal bowel sounds, soft, nontender, nondistended.  GU AND RECTAL:  Deferred.  EXTREMITIES:  She has a brace on her left leg.  Pulses are intact.  No  edema.  No calf tenderness on the right.  NEUROLOGIC:  Sleepy but oriented.  Sensory motor exam is grossly intact.  SKIN:  No rash.   LABORATORY DATA:  CBC is significant for hemoglobin of 10.1, hematocrit  30.  Basic metabolic panel unremarkable.  EKG shows left bundle-branch  block, which is unchanged from previous.  Left knee x-ray shows  displaced distal femoral shaft fracture.  X-ray of the left tib-fib  shows nothing acute.  Left femur x-ray shows distal left femoral shaft  fracture with significant displacement and overlapping of fracture  fragments.  Chest x-ray shows pulmonary hyperaeration, nothing acute.   Thank you Dr. Luiz Blare for this consult.  We will continue to follow.      Corinna L. Lendell Caprice, MD  Electronically Signed     CLS/MEDQ  D:  04/07/2008  T:  04/07/2008  Job:  [id number]   cc:   Donia Guiles, M.D.

## 2010-10-29 NOTE — H&P (Signed)
[name], [name] NO.:  [id number]   MEDICAL RECORD NO.:  [id number]          PATIENT TYPE:  IPS   LOCATION:  4011                         FACILITY:  [facility]   PHYSICIAN:  Ranelle Oyster, M.D.DATE OF BIRTH:  1933/08/26   DATE OF ADMISSION:  04/12/2008  DATE OF DISCHARGE:                              HISTORY & PHYSICAL   CHIEF COMPLAINT:  Left hip pain.   HISTORY OF PRESENT ILLNESS:  This is a 77 year old white female with  history of iatrogenic peripheral neuropathy who had previous a left  intertrochanteric hip fracture in July and had a subsequent ORIF.  She  went to a skilled nursing facility.  The patient failed her left hip and  in September 2009 was converted to left hip arthroplasty and discharged  to [facility].  She returned home on April 02, 2008, and  had a fall on April 04, 2008, sustaining a left periprosthetic femur  fracture.  She underwent an ORIF of the left femur fracture on April 05, 2008, by Dr. Delorise Shiner.  She is toe down weightbearing with posterior  hip precautions and is in the knee immobilizer for stability.  She  continues to have issues related to the hip pain as well as her  neuropathy.  She is evaluated by rehab who felt that she could benefit  from an inpatient course and the patient subsequently was admitted  today.   REVIEW OF SYSTEMS:  Notable for falls as well as restless leg and  neuropathic pain.  She has constipation at home requiring stool  softeners and laxatives.  Full review is in the written H&P.   PAST MEDICAL HISTORY:  1. Positive hypertension.  2. Left bundle-branch block.  3. Hypokalemia.  4. Restless leg syndrome subdural in 2000 after a fall.  5. Peripheral neuropathy.  6. Left intertrochanteric hip fracture in July 2009.  7. 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   She does not drink or smoke.   FAMILY HISTORY:  Noncontributory.   SOCIAL HISTORY:  She lives with a female  companion x25 years.  A son is in  the area, but works.  Roommate can assist.  She has one-level house with  ramp.   FUNCTIONAL HISTORY:  The patient is independent with cane, walker, and  motorized scooter prior to arrival.  At the rehab consultation, the  patient was mod assist for transferring, basic mobility, and self-care.  The patient essentially was at the same level upon transfer today.   ALLERGIES:  PENICILLIN, CODEINE, MACROLIDES.   HOME MEDICATIONS:  Aspirin, Lipitor, Lopressor, Zyrtec.   LABORATORY DATA:  Hemoglobin 9.9, white count 7, platelets 417.  Sodium  139, potassium 4.3, BUN and creatinine 12 and 0.9.   PHYSICAL EXAMINATION:  VITAL SIGNS:  Blood pressure is 127/62, pulse 87,  respiratory rate is 20, temperature 97.0.  GENERAL:  The patient is pleasant, alert and oriented x3.  HEENT:  Pupils equal, round, and reactive to light.  Oral mucosa is pink  and moist and teeth are in fair condition.  NECK:  Supple without  JVD or lymphadenopathy.  CHEST:  Clear to auscultation bilaterally without wheezes, rales, or  rhonchi.  HEART:  Regular rate and rhythm without murmur, rubs, or gallops.  ABDOMEN:  Soft, nontender.  Bowel sounds are positive.  SKIN:  Dry in both lower extremities below the knees.  She has bruises  over the forearms near previous IV sites.  NEUROLOGICAL:  Cranial nerves II through XII are grossly intact.  Reflexes are 1+.  Sensation is decreased to pinprick and light touch  discrimination distally below the knees and in the hands.  She has  dysesthesias in both legs today.  Judgment, orientation, memory, mood  were all within functional limits.  Strength is 5/5 in both upper  extremities.  Right lower extremities 3/5 to 4/5 distally.  Left lower  extremity, she has 1/5 proximally to 3/5 distally.  She is in knee  immobilizer and is limited due to pain.  Left hip wound was covered with  Mepilex dressing and after exposing wound was clean, dry, and  intact.   ASSESSMENT/PLAN:  1. Functional deficits secondary to left femur fracture status post      open reduction and internal fixation April 05, 2008, due to      periprosthetic fracture.  The patient also with severe peripheral      neuropathy and gait disorder secondary to this.  The patient is      admitted to the inpatient rehab unit today to receive collaborative      interdisciplinary care between the physiatrist, rehab nursing      staff, and therapy team.  The patient's level of medical complexity      and substantial therapy needs in context of that medical necessity      cannot be provided at a lesser intensity of care.  Physiatrist will      provide 24-hour management of medical needs as well as oversight of      therapy plan/treatment and provide guidance as appropriate      regarding interaction of the two.  24-hour rehab nursing will      assist in the management of the patient's bowel and bladder needs      and appropriate pain management.  She may benefit from scheduled      pain medications prior to therapies.  Observe closely for sleep      patterns and appropriate nutrition.  They also will help integrate      therapy concept, techniques, and education.  PT will assess him,      treat for balance, proprioceptive needs, the appropriate adaptive      equipment, modifications, and family education.  The patient has      had a high fall risk at baseline with gait.  OT will assess and      treat for upper extremity use, appropriate safety measures, family      education, and equipment as appropriate.  Case management/social      worker will assess for psychosocial issues, discharge planning.      Team conferences will be held weekly to establish goals, assess      progress, and to determine barriers to discharge.  The patient will      receive at least 3 hours of therapy per day at least 5 days a week.      Rehab goals are supervision for basic mobility and self-care.   She      may need occasional min assist with lower extremity self-care.  Estimated length of stay is 10 days.  Prognosis fair to good.  2. Postoperative anemia:  Check admission CBC.  3. Pain control.  Observe with Lyrica and Percocet.  Resume nighttime      imipramine for restless leg symptoms.  Observe tolerance with      therapy.  4. Hyperlipidemia:  Zocor.  5. Hypertension:  Maintain Lopressor for now.  Observe blood pressure      and pulse rate.  6. Deep venous thrombosis prophylaxis, subcu Lovenox.  7. Wound care:  Continue Mepilex dressing for protection.  Wound is      dry and clean.  We will maintain knee immobilizer for      fractures/operative site stability.  Posterior hip precautions to      be implemented.      Ranelle Oyster, M.D.  Electronically Signed     ZTS/MEDQ  D:  04/12/2008  T:  04/13/2008  Job:  [id number]

## 2010-10-29 NOTE — H&P (Signed)
[name], [name] NO.:  [id number]   MEDICAL RECORD NO.:  [id number]          PATIENT TYPE:  INP   LOCATION:  3003                         FACILITY:  [facility]   PHYSICIAN:  Ramiro Harvest, MD    DATE OF BIRTH:  1934-03-30   DATE OF ADMISSION:  08/17/2008  DATE OF DISCHARGE:                              HISTORY & PHYSICAL   PRIMARY CARE PHYSICIAN:  Dr. Donia Guiles of [facility].   HISTORY OF PRESENT ILLNESS:  [name] is a 77 year old white female  with history of left distal femur fracture status post ORIF in October  2009, history of left total hip replacement September 2009, history of  bilateral carotid endarterectomies, history of left hemicolectomy  secondary to ischemic colitis in 2002, history of partial small-bowel  obstruction, history of depression, COPD, who presented to the ED with a  2-day history of right upper quadrant epigastric pain which started  shortly after eating dinner.  The patient describes the pain as sharp  and intermittent usually lasting about 5 minutes, some associated  nausea, weakness, and constipation.  The patient denies fevers, no  chills, no chest pain, no shortness of breath, no diarrhea, no melena,  no hematemesis, no depression, COPD who presented to the ED with a 2-day  history of right upper quadrant epigastric pain which started shortly  after dinner.  The patient describes the pain as sharp, intermittent,  lasting 5 minutes with some associated nausea, weakness, and  constipation.  The patient denied any fevers, no chills, no chest pain,  no shortness of breath, no diarrhea, no melena, no hematemesis, no  hematochezia, no focal neurological symptoms, no cough.  The patient  tried some milk of magnesia with no improvement.  The patient states she  called 911, and was brought to the ED.  In the ED CBC obtained did show  an elevated white count at 14.0, hemoglobin of 15.4, hematocrit of 45.8,  platelet count  of 296, and ANC of 10.7.  Coags were within normal  limits.  Comprehensive metabolic profile with a potassium of 5.5,  otherwise, and an albumin of 3.3; otherwise, was within normal limits.  Lipase was 21.  Urinalysis was bland. Magnesium level of 2.5.  Abdominal  ultrasound which showed a decompressed gallbladder.  Acute abdominal  series did show gas-filled loops of small and large bowel consistent  with adynamic ileus.  We were called to admit the patient for further  evaluation and management.   ALLERGIES:  1. CODEINE.  2. PENICILLIN.  3. ERYTHROMYCIN.   PAST MEDICAL HISTORY:  1. Hypertension.  2. Hyperlipidemia.  3. Peripheral neuropathy.  4. Restless leg syndrome.  5. History of subdural hematoma and subarachnoid hemorrhage in 2000.  6. Left distal femur fracture status post ORIF October 2009 per Dr.      Luiz Blare.  7. Status post left total hip replacement for nonunion left hip      fracture September 2009.  8. COPD.  9. Status post left nephrectomy at age 78.  10.Seasonal allergies.  11.Depression.  12.History of falls.  13.History of  ischemic colitis status post left hemicolectomy in 2002.  14.Status post hysterectomy in 1995.  15.History of partial small-bowel obstruction in 2006.  16.History of diverticulitis.  17.Migraine headaches.  18.Gait instability.  19.Bilateral carotid endarterectomy.  The right one was done March      2000 and the left one November 2000.  20.Bilateral cataract surgery.  21.Peripheral vascular disease.  22.History of multiple small strokes.   HOME MEDICATIONS:  1. Aspirin 325 mg p.o. daily.  2. Lipitor 20 mg p.o. daily.  3. Zyrtec 10 mg p.o. daily.  4. Lyrica 50 mg p.o. b.i.d.  5. Metoprolol 25 mg p.o. b.i.d.  6. Percocet 5/325 p.r.n.  7. Imipramine, dose unknown.   SOCIAL HISTORY:  The patient is divorced.  Lives in [place] with her  son.  Positive tobacco use, no alcohol use, no IV drug use.   FAMILY HISTORY:   Noncontributory.   REVIEW OF SYSTEMS:  As per HPI; otherwise, negative.   PHYSICAL EXAMINATION:  Temperature 98.7, blood pressure 140/77, pulse of  112, respiratory rate 20, saturating 95% on room air.  GENERAL:  Patient lying in bed in no apparent distress.  HEENT:  Normocephalic, atraumatic.  Pupils equal, round, and reactive to  light and accommodation.  Extraocular movements intact.  Oropharynx is  clear.  No lesions, no exudates.  NECK:  Supple.  No lymphadenopathy.  Dry mucous membranes.  RESPIRATORY:  Lungs are clear to auscultation bilaterally.  No wheezes,  no crackles, no rhonchi.  CARDIOVASCULAR:  Regular rate and rhythm.  No  murmurs, rubs or gallops.  ABDOMEN:  Diffuse, abdominal tenderness, soft, slight distention,  decreased bowel sounds.  No rebound, no guarding.  EXTREMITIES:  No clubbing, cyanosis or edema.  NEUROLOGICAL:  The patient is alert and oriented x3.  Cranial nerves II-  XII are grossly intact.  No focal deficits.   ADMISSION LABORATORIES:  Magnesium of 2.5, lipase level of 21.  UA was  yellow, clear, specific gravity 1.018, pH of 8, glucose negative,  bilirubin negative, ketones negative, blood negative, protein 30,  urobilinogen 0.2, nitrite negative, leukocytes negative.  Urine  microscopy:  wbc's 0-2, rbc's 3-6.  Comprehensive metabolic profile:  Sodium of 140, potassium 5.5, chloride 100, bicarb 32, glucose 133, BUN  12, creatinine 1.18.  Bilirubin of 0.6, alk phosphatase 115.  AST 26,  ALT 16.  Protein 6.2.  Albumin 3.3.  Calcium of 10.1.  PTT 28, PT 12.9,  INR 1.0.  CBC:  White count 14.0, hemoglobin 15.4, hematocrit 45.8,  platelets of 296, ANC of 10.7.  Abdominal ultrasound showed limited  study demonstrating no acute finding.  Gallbladder is decompressed, a  right renal cyst, small abdominal aortic aneurysm.  Acute abdominal  series done shows gas-filled loops of small and large bowel findings.  May represent an adynamic ileus, no acute chest  findings.   ASSESSMENT AND PLAN:  [name] is a 77 year old female with a  history of femur fracture status post ORIF in October 2009, history of  left total hip replacement September 2009, on Percocet as needed for  pain, history of a partial small-bowel obstruction in the past, history  of ischemic colitis status post left hemicolectomy 2002, who has a  history of chronic obstructive pulmonary disease presenting to the  emergency department with 2-day history of epigastric/right upper  quadrant abdominal pain, some nausea, and generalized weakness.  1. Probable adynamic ileus likely secondary to opiate medications post      surgery.  Admit the  patient to a regular floor.  Check a BMET.      Check a TSH.  Check a lactic acid level.  Keep magnesium level      greater than 2.  Make n.p.o., intravenous fluids, supportive care,      minimize opioids, serial x-rays.  If no improvement, consider a      gastroesophageal versus a general surgery consult for further      evaluation and recommendations.  Will follow for now.  2. Dehydration.  Continue intravenous fluids.  3. Hyperkalemia.  Recheck a basic metabolic profile.  Check a      magnesium level.  If it is still elevated, will give some insulin      and Lasix and follow.  4. Leukocytosis likely a reactive leukocytosis secondary to problem      #1.  Urinalysis is negative.  A chest x-ray is negative.  Will      check a lactic acid level.  No need for antibiotics at this time.  5. Hyperlipidemia.  Lipitor.  6. Hypertension.  We will hold blood pressure medications for now.  7. Restless leg syndrome.  8. History of subdural hematoma/subarachnoid hemorrhage in 2000.  9. Status post left femur fracture, stable.  10.Headache.  11.Prophylaxis Protonix for gastrointestinal prophylaxis.  Sequential      compression devices for deep venous thrombosis prophylaxis.   It has been a pleasure taking care of [name].      Ramiro Harvest,  MD  Electronically Signed     DT/MEDQ  D:  08/18/2008  T:  08/18/2008  Job:  [id number]   cc:   Donia Guiles, M.D.

## 2010-10-29 NOTE — Op Note (Signed)
NAMEARHIANNA, EBEY NO.:  [id number]   MEDICAL RECORD NO.:  [id number]          PATIENT TYPE:  INP   LOCATION:  0098                         FACILITY:  [facility]   PHYSICIAN:  Feliberto Gottron. Turner Daniels, M.D.   DATE OF BIRTH:  Jan 17, 1934   DATE OF PROCEDURE:  DATE OF DISCHARGE:                               OPERATIVE REPORT   PREOPERATIVE DIAGNOSIS:  Reverse obliquity four-part left hip  intertrochanteric subtrochanteric fracture.   POSTOPERATIVE DIAGNOSIS:  Reverse obliquity four-part left hip  intertrochanteric subtrochanteric fracture.   PROCEDURE:  Open reduction and internal fixation using a DePuy ATN  trochanteric nail 36 cm in length and 11 mm in diameter.  A single  proximal locking bolt up into the femoral head and a single distal  locking bolt on the proximal left screw hole.   SURGEON:  Feliberto Gottron. Turner Daniels, MD   FIRST ASSISTANT:  Shirl Harris, PA.   ANESTHETIC:  General endotracheal.   ESTIMATED BLOOD LOSS:  100 mL.   FLUID REPLACEMENT:  800 mL crystalloid.   DRAINS PLACED:  Foley catheter.   URINE OUTPUT:  300 mL.   INDICATIONS FOR PROCEDURE:  A 77 year old woman with severe left lower  extremity peripheral neuropathy who fell off her walker today and  sustained a comminuted four-part reverse obliquity left hip  intertrochanteric fracture.  She was transferred to [facility]  Emergency Room, orthopedic consultation was obtained, and she was  prepared for surgical stabilization of this relatively four-part  intertrochanteric fracture with a reverse obliquity as well.  Risks and  benefits of surgery were discussed, questions answered.   DESCRIPTION OF PROCEDURE:  The patient identified by armband and taken  into the operating room at [facility] after  receiving a gram of vancomycin in the preop area.  She is allergic to  PENICILLIN.  Appropriate anesthetic monitors were attached, and then  general endotracheal anesthesia was induced  with the patient in supine  position.  A Foley catheter was inserted.  She was then placed on a  radiolucent table and rolled into the right lateral decubitus position  and held there with a Hulbert Mark II pelvic clamp.  At this point, the  left lower extremity was prepped and draped in the sterile fashion from  the ankle of the hemipelvis, and under C-arm image control an adequate  reduction was obtained.  Satisfied with the reduction using the C-arm,  we found lines that were 90 degrees apart.  They were coaxial with the  femoral shaft.  I made an incision on the proximal posterior buttock  region in line with the femoral shaft and under C-arm control placed a  guidewire into the greater trochanteric region of the left hip.  A  guidewire was then driven into the proximal femur and over reamed with  the ANT trochanteric nail proximal reamer.  The ball-tipped guidewire  was then placed on the femur.  We sequentially reamed up to a 13-mm  reamer to full depth of 14 mm proximal one-half of the femur.  We then  measured for a 36-cm  x 11-mm ATN nail which was loaded on the inserter  guided through the fracture site over the driving guidewire which was  exchanged out for the ball-tipped guidewire with the snorkel.  Once the  nail was seated in the appropriate position under C-arm control using  the proximal bolt guide, a guide pin was placed at the dead center on  the femoral head on the AP view, on the lateral view slightly posterior  and measured for a 90 mm bolt.  This was then over reamed with the bolt  reamer followed by a tap and followed by the 90-mm bolt was seated  nicely deep into the femoral head, obtaining good purchase on the last 2  turns and also slightly reducing the greater trochanteric fracture.  AP  and lateral C-arm images were then taken confirming good reduction, and  at this point we directed our attention to the distal aspect of the  nail, and using the perfect circle  technique made a small stab wound in  the lateral skin and drilled for a locking screw in the proximal hole of  the distal femoral nail.  This measured out at 40 mm, and a 40-mm of  locking screw was then placed across the femur.  We then took AP and  lateral C-arm images proximally and distally including the fracture  site, took the hip through range of motion to make sure there was no  impingement and the wounds were then irrigated out with normal saline  solution.  The proximal wound where the nail was driven was actually  about 8 or 9 cm in length secondary to an extension of the incision  required for the patient's obesity to get the nail platform seated.  The  proximal wound was then closed in layers with running 0 Vicryl suture in  two layers and then 2-0 Vicryl suture and one layer running interlocking  3-0 nylon suture and a single layer in the skin.  The bolt wound which  was about 2 cm in length was closed with 2-0 Vicryl suture in the  subcutaneous tissue and running interlocking 3-0 nylon suture in the  skin and the distal locking screw was closed with a single 3-0 nylon  horizontal mattress loop.  At this point, the dressing of Mepilex on the  proximal wound was applied and Xeroform, 4 x 4's, and Hypafix on the two  distal wounds.  The patient was then undraped, rolled supine, awakened,  and taken to the recovery room without difficulty.      Feliberto Gottron. Turner Daniels, M.D.  Electronically Signed     FJR/MEDQ  D:  12/16/2007  T:  12/17/2007  Job:  [id number]

## 2010-10-29 NOTE — Discharge Summary (Signed)
[name], BARNHARDT NO.:  [id number]   MEDICAL RECORD NO.:  [id number]          PATIENT TYPE:  IPS   LOCATION:  4011                         FACILITY:  [facility]   PHYSICIAN:  Ranelle Oyster, M.D.DATE OF BIRTH:  18-Aug-1933   DATE OF ADMISSION:  04/12/2008  DATE OF DISCHARGE:  04/25/2008                               DISCHARGE SUMMARY   DISCHARGE DIAGNOSES:  1. Left femur fracture, status post open reduction internal fixation,      April 05, 2008.  2. History of left hip hemiarthroplasty, September 2009.  3. Anemia.  4. Pain control.  5. Hyperlipidemia.  6. Hypertension.  7. Escherichia coil urinary tract infection.  8. History of multiple falls.  9. Subcutaneous Lovenox for deep vein thrombosis prophylaxis.   This is a 77 year old white female with history of a left  intertrochanteric hip fracture in July 2009, received open reduction  internal fixation, discharge to skilled nursing facility.  Ultimately  failed left hip on September 2009 and converted to hip arthroplasty and  again discharged to [facility] before  returning home on April 02, 2008, with a fall and on April 04, 2008,  sustained a left femur fracture with multiple falls.  Underwent open  reduction internal fixation left femur April 05, 2008, per Dr. Luiz Blare,  touchdown weightbearing.  Subcutaneous Lovenox for deep vein thrombosis  prophylaxis.  Posterior hip precautions with limited mobility secondary  to multiple falls, pain management ongoing.  She was admitted for  comprehensive rehab program.   PAST MEDICAL HISTORY:  See discharge diagnoses.   No alcohol or tobacco.   Allergies to PENICILLIN, CODEINE, and MACROLIDES.   SOCIAL HISTORY:  She lives with her female companion of more than 25  years.  She has a son in the area that works.  She has assistance from  her roommate as needed.  They live in a one-level home with a ramp.   FUNCTIONAL HISTORY:   Prior to admission, she used a cane walker and a  motorized scooter.   FUNCTIONAL STATUS:  Upon admission to rehab, she was moderate assist for  standing and transfers, moderate assist bed mobility.   MEDICATIONS PRIOR TO ADMISSION:  1. Aspirin daily.  2. Lipitor 20 mg daily.  3. Lopressor 25 mg twice daily.  4. Zyrtec as needed.   PHYSICAL EXAMINATION:  VITAL SIGNS:  Blood pressure 127, diastolic 62,  pulse 87, temperature 97, and respirations 20.  GENERAL:  This is an alert female in no acute distress, oriented x3.  EXTREMITIES:  Deep tendon reflexes were 2+.  Staples in place to left  hip with drainage.  She did have some skin tears to the right forearm.  LUNGS:  Clear to auscultation.  CARDIAC:  Regular rate and rhythm.  ABDOMEN:  Soft and nontender.  Good bowel sounds.   REHABILITATION HOSPITAL COURSE:  The patient was admitted to inpatient  rehab services with therapies initiated on a 3-hour daily basis  consisting of physical therapy, occupational therapy, and rehabilitation  nursing.  The following issues were addressed during the patient's  rehabilitation stay.  Pertaining to [name]'s left femur fracture, she  had undergone open reduction internal fixation on April 05, 2008.  Surgical site healing nicely.  Staples have been removed.  No signs of  infection.  She was touchdown weightbearing with posterior hip  precautions.  Pain management ongoing with the use of Percocet.  Her  Lyrica was held due to some lower extremity edema and monitored.  Postoperative anemia continued to improve with latest hemoglobin 11.4,  hematocrit 35.2.  She remained on her Lipitor for hyperlipidemia as well  as Lopressor for blood pressure control with no orthostatic changes  noted.  She was on subcutaneous Lovenox for deep vein thrombosis  prophylaxis throughout her rehab course.  She resume her aspirin therapy  after discharge.  Noted the patient with history of falls, so mobility   remained limited.  She was using a cane, walker, and motorized  wheelchair prior to admission.  Overall, she was minimum to moderate  assist with a rolling walker.  Supervision upper body bathing and  dressing, moderate assist lower body.  Full family teaching was  completed with her female companion with good results.  During her  rehabilitation course, she did complete a 7-day course of Cipro for an E-  coli urinary tract infection.   Weekly collaborative interdisciplinary team conferences were held to  discuss the patient's estimated length of stays.  Family teaching being  completed and any barriers to discharge.  She was discharged to home on  April 25, 2008, with home health therapies as advised.  Discharge  medications at time of dictation included Lipitor 20 mg daily, Zyrtec as  needed, Lopressor 25 mg twice daily, Tofranil 25 mg at bedtime, Percocet  5/325 mg one to two tablets every 4 hours as needed for pain, dispense  of 60 tablets.  Diet was regular.  She was touchdown weightbearing left  leg, posterior hip precautions, knee immobilizer when asleep.  She would  follow up Dr. Faith Rogue at the outpatient rehab service office as  needed; Dr. Arvilla Market, medical management; Dr. Jodi Geralds, orthopedic  services, call for appointment.      Mariam Dollar, P.A.      Ranelle Oyster, M.D.  Electronically Signed    DA/MEDQ  D:  04/25/2008  T:  04/25/2008  Job:  [id number]   cc:   Donia Guiles, M.D.  Harvie Junior, M.D.

## 2010-10-29 NOTE — Op Note (Signed)
[name], [name]                 ACCOUNT NO.:  [id number]   MEDICAL RECORD NO.:  [id number]          PATIENT TYPE:  INP   LOCATION:  5015                         FACILITY:  [facility]   PHYSICIAN:  Harvie Junior, M.D.   DATE OF BIRTH:  1934/03/10   DATE OF PROCEDURE:  04/04/2008  DATE OF DISCHARGE:                               OPERATIVE REPORT   PREOPERATIVE DIAGNOSIS:  Spiral distal femur fracture below a total hip  replacement.   POSTOPERATIVE DIAGNOSIS:  Spiral distal femur fracture below a total hip  replacement.   PRINCIPAL PROCEDURE:  Open reduction and internal fixation of spiral  femur fracture with a DePuy polyax plate with locking plate fixation  from the lateral condyle all the way up to the subtrochanteric region  with Dall-Miles cabling of the portion of the plate which is half the  femoral stem.   SURGEON:  Harvie Junior, MD   ASSISTANT:  Marshia Ly, PA   ANESTHESIA:  General.   BRIEF HISTORY:  [name] is a 77 year old female with a long history of  having a complicated history of having had an intramedullary fixation of  a hip fracture.  Ultimately, she cut out the head and had to have a  total hip replacement.  This was able to be done with S-ROM system and  once this was completed, she was doing well up until the 6-week mark  when she had a fall and suffered a very unusual spiral femur fracture  which was started about an inch below the stem and curved down close to  the knee joint itself.  Once this was evaluated, she was noted to have  this complex femur fracture.  A prolonged preoperative planning  technique was undertaken.  Consultation with multiple physicians as well  as consultation with reps to bring instrumentation into the hospital  which was necessary to fix this complex fracture.  It was clear that we  were not going to be able to get enough fixation with an intramedullary  device.  We were not going to be able to get enough fixation with any  plate device and ultimately, we were going to have to use Dall-Miles  cables up above the fracture where the femoral stem was.  This took an  extreme amount of preoperative planning and ultimately, she was brought  to the operating room for fixation of this procedure.   PROCEDURE:  The patient was brought to the operating room and adequate  anesthesia was obtained with general anesthetic.  The patient was placed  in the right lateral decubitus position.  All bony prominences were well  padded.  Attention was then turned to the left hip which was prepped and  draped in usual sterile fashion.  Following this, preoperative planning  assured [place] that we would be able to use a sterile tourniquet, but that  we will not be able to do the upper part of the fixation with it at all.  We felt it still would be appropriate to try to minimize blood loss, so  a sterile tourniquet was applied  and leg was exsanguinated with  tourniquet inflated to 300 mmHg.  Following this, an incision was made  for the distal end of the femoral fracture and the fracture was cleared  of all healing elements and held in an anatomically reduced position  with a DePuy polyax plate put in place going about 2 cm just proximal to  the lateral femoral condyle.  Once the plate was in place and was held  with a turkey-claw clamp and the femur was anatomically reduced,  reduction was undertaken with a combination of interlocking as well as  compression screws.  We got 2 nice interfragmentary screws across the  femur fracture through the plate and also we put a Dall-Miles cable  across this area of the femur for additional fixation.  The polyax  screws were used distally which gave multiple points of fixation and  locking fixation and attention was then turned proximally.  At this  point, we used fluoro.  We had gotten all the screws up to about the  eighth screw put in place and had 1 just distal to the tip of the stem  and at that  point, we were not able to put further screws in and  ultimately went to Dall-Miles cables.  At this point, we let the  tourniquet down and we were able to extend the incision up and place 3  Dall-Miles cables at the upper screw holes and this gave excellent  fixation along the proximal portion of the femur where the total hip  was.  Once this fixation had been achieved, the fluoro was again used  for final imaging which showed that we had excellent fixation anatomic  with excellent screw placement.  At this point, the wound was copiously  and thoroughly irrigated and suctioned dry.  The vastus lateralis muscle  was closed with 0 Vicryl running.  The tensor fascia was closed with 1  Vicryl running.  Skin with 0 and 2-0 Vicryl and skin staples.  A sterile  compressive dressing was applied, and the patient was taken to the  recovery room and she was noted to be in satisfactory condition.  Estimated blood loss for the procedure was about 500 mL.      Harvie Junior, M.D.  Electronically Signed     JLG/MEDQ  D:  04/05/2008  T:  04/06/2008  Job:  [id number]

## 2010-10-29 NOTE — Consult Note (Signed)
NEW PATIENT CONSULTATION   [name], [name]  DOB:  03-08-1934                                       05/03/2010  CHART#:[id number]   REASON FOR CONSULTATION:  Enlarging abdominal aortic aneurysm.   HISTORY OF PRESENT ILLNESS:  This is a 77 year old female who presents  with chief complaint of enlarging abdominal aortic aneurysm.  She denies  any back pain or abdominal pain.  She does have a family history of  abdominal aortic aneurysm.  Father apparently had a AAA that required  repair.  He eventually died due to some failure to thrive after repair  for that.  Otherwise she has no known history of connective tissue  disorders.  Other risk factors, she does have hypertension, continues to  smoke, has a greater than 60-pack-year history.  She denies any COPD.  Her age is a risk factor at 77 years old.  Apparently her companion died  from a ruptured AAA, and this is what led to her getting a screening  exam which then led to that diagnosis of her abdominal aortic aneurysm.  Otherwise she denies any symptomatology from it.   PAST MEDICAL HISTORY:  Abdominal aortic aneurysm, cholelithiasis,  hypertension, hyperlipidemia, history of a transient ischemic attack,  neuropathy, osteoporosis, osteoarthritis.   PAST SURGICAL HISTORY:  Bilateral carotid endarterectomies that were  done by Dr. Edilia Bo, though I do not see it in her chart currently.  She  also had a left nephrectomy done, the exact reason the patient is not  aware of, and some type of colectomy which sounds like it was done for  some type of dysmotility but she is not exactly certain.   SOCIAL HISTORY:  She is an active smoker, about half a pack a day.  She  has a greater than 60-pack-year history of smoking.  Denies any alcohol  or illicit drug use.   FAMILY HISTORY:  Father had abdominal aortic aneurysm.  The mother had  diabetes, congestive heart failure, hypertension, hyperlipidemia and  obesity.  She had  one brother that also had diabetes.   MEDICATIONS:  Aspirin, Zyrtec, fish oil, vitamin D, Lyrica, Lipitor,  Zetia.   ALLERGIES:  Penicillin and codeine.   REVIEW OF SYSTEMS:  She noted dizziness, pain in legs with walking and  pain in legs with lying flat and constipation at times.  Otherwise the  rest of her 12-point review of systems was noted to be negative.   PHYSICAL EXAMINATION:  Vital signs:  Blood pressure 91/59, heart rate of  82, respirations were 12.  General:  She appears stated age, well-developed, well-nourished.  Head: Normocephalic, atraumatic.  Mild temporalis wasting.  ENT:  Hearing was grossly intact.  The oropharynx had no erythema or  exudate.  Nares had erythema without any drainage.  Neck:  Supple without any nuchal rigidity or JVD.  Eyes:  Pupils were equal, round, reactive to light.  Extraocular  movements were intact.  She had arcus senilis in both pupils.  Pulmonary:  Symmetric expansion.  Good air movement.  No rhonchi, rales  or wheezing.  Cardiac:  Regular rate and rhythm.  Normal S1 and S2.  No murmurs, rubs,  thrills or gallops.  Vascular:  She had palpable radial, brachial pulses and carotids.  There  were no bruits in the carotids.  I could not appreciate her abdominal  aorta due to some mild obesity.  Femorals were palpable with some  difficulty.  Did not appreciate popliteal or pedal pulses on either  side.  GI:  Mildly obese, nontender, nondistended guarding or rebound.  No  splenomegaly.  No obvious masses.  Musculoskeletal:  She had 5/5 strength in all extremities.  She had no  obvious gangrene or ulcers in any extremity.  Neurologic:  Cranial nerves II-XII were intact.  Motor exam was as  listed above.  Sensation was grossly intact in all extremities.  Psych:  Her judgment appeared to be intact.  Her mood and affect were  appropriate for clinical situation.  Skin:  On her extremities were as listed above.  She had no obvious  rashes  elsewhere, however.  Lymphatic:  She had no cervical, axillary or inguinal lymphadenopathy  appreciated.   I reviewed then about 15 pages worth of documents from her outside  sources which included 2 sets of abdominal ultrasounds.  The most recent  one from the 21st notes that there was a fusiform abdominal aortic  aneurysm at the mid aorta measuring 4.3 cm, but on the mid abdominal  measurements in the transverse it notes it to be only 3.9 cm, so I am  not exactly certain how big this aneurysm is.  Previously it was listed  in the mid at 4.0 cm at the maximal diameter.  The two sets of studies  do not correlate.  There was, however, a CTA that was completed which I  reviewed and it demonstrates an image that was 4.1 cm x 3.8 cm; however,  you can tell from the way this picture is done that this is not an  orthogonal measurement, so I suspect even the 4.1 cm here is actually an  over measurement of the actual size of this patient's aorta at this  level.   MEDICAL DECISION MAKING:  This is a 77 year old female with asymptomatic  abdominal aortic aneurysm.  It would be considered a small aneurysm that  does not require immediate intervention.  I reiterated to the patient my  concerns that she needs to quit smoking, as this is been recognized in  multiple studies as the #1 risk factor for progression of her aneurysmal  disease.  She agrees that she is going to on her own try to stop smoking  and if she has difficulty she will come back for some additional help  with smoking cessation. At her size, I would recommend annual  surveillance for her aorta.  If it enlarges to the range of 5 to 5.5 cm  at that point we would consider proceeding with repair.  As the 2  ultrasound reports do not correlate in any fashion whatsoever, I do not  know what to make of the fact that there is a significant change between  the 2, as clearly on the most recent report the distal is listed as 2.1  cm and  previously it was listed at 4.0 cm, so I doubt that the aneurysm  actually got smaller at that aspect and probably this is a transcription  error, so in fact it was actually 4.0 cm and it was 3.9 cm on the most  recent study, suggesting no significant change in aneurysm.  The CAT  scan also correlates more with this interpretation of the values, so in  fact there was no significant change in this aneurysm size and no need  to intervene.  As I have told her before, growth  of 0.5 cm over 6 months  is also an indication for proceeding or any symptomatic disease.  She  knows what to look for in terms of the symptomatic development in terms  of her aneurysmal disease, and she is going to follow up with [place] if she  develops any issues.  I have appreciate being given the opportunity to  participate in this patient's care.     Leonides Sake, MD  Electronically Signed   BC/MEDQ  D:  05/03/2010  T:  05/06/2010  Job:  [id number]   cc:   Lupita Raider, M.D.

## 2010-10-29 NOTE — Op Note (Signed)
NAMESHAKOYA, GILMORE                 ACCOUNT NO.:  [id number]   MEDICAL RECORD NO.:  [id number]          PATIENT TYPE:  INP   LOCATION:  5003                         FACILITY:  [facility]   PHYSICIAN:  Feliberto Gottron. Turner Daniels, M.D.   DATE OF BIRTH:  [birth year]   DATE OF PROCEDURE:  02/28/2008  DATE OF DISCHARGE:                               OPERATIVE REPORT   PREOPERATIVE DIAGNOSIS:  Failed trochanteric nailing of left hip  intertrochanteric/subtrochanteric fracture.   POSTOPERATIVE DIAGNOSIS:  Failed trochanteric nailing of left hip  intertrochanteric/subtrochanteric fracture.   PROCEDURE:  Removal of trochanteric nail hardware proximally and  distally followed by complex total hip arthroplasty using a 52-mm DePuy  ASR cup NK+0 46-mm ultimate head, 20 x 15 x 160 x 42 stem, 20 D small  cone.   SURGEON:  Feliberto Gottron. Turner Daniels, MD   FIRST ASSISTANT:  Shirl Harris, PA.   ANESTHETIC:  General endotracheal.   ESTIMATED BLOOD LOSS:  800 mL.   FLUID REPLACEMENT:  2400 mL crystalloid.   DRAINS PLACED:  Foley catheter.   URINE OUTPUT:  400 mL.   INDICATIONS FOR PROCEDURE:  The patient is a 77 year old woman who  sustained a closed comminuted left hip subtroch/intertroch fracture  about 2 months ago.  On the initial postop films, the lag bolt was dead  center on the AP and lateral views.  Unfortunately, the ball cut out  into the subchondral bone and most recently on x-ray, into the  acetabulum.  Because of loss of fixation and increasing pain, she is set  up for removal of the hardware and conversion to a total hip  arthroplasty utilizing either a fully coated 6-inch AML stem or if there  is a bony union of the greater trochanter, she would be a candidate for  an SROM prosthesis.  Risks and benefits of surgery have been discussed  at length with Ms. Chay, her husband, and her family and she desires  removal of the hardware and conversion to total hip arthroplasty to  decrease pain and increase  function, as she has not been able to put on  any weight on it since the injury 2 months ago.   DESCRIPTION OF PROCEDURE:  The patient was identified by armband,  received 1 g of Ancef preoperatively, and was taken to the operating  room 1 at [facility] where the appropriate anesthetic monitors  were attached and general endotracheal anesthesia was induced.  A Foley  catheter was then inserted and she was rolled into the right lateral  decubitus position and fixed there with a Stulberg Mark II pelvic clamp.  At this point, the left lower extremity was prepped and draped in usual  sterile fashion from the ankle to the hemi pelvis and the skin along the  lateral hip and thigh was infiltrated with 20 mL of 0.5% Marcaine and  epinephrine solution.  A lateral incision was made along the hip  utilizing the insertion point of the nail and going down distally to the  incision used for placement of the lag bolt.  Distally, a 1-cm incision  was made over the distal locking screw and this was removed relatively  easily with a standard locking screwdriver.  Getting back to the  proximal wound, small bleeders in the skin and subcutaneous tissue  identified and cauterized.  The IT band was cut allowing the skin  incision exposing the greater trochanter, which to clinical palpation  felt like it was united.  We then made a longitudinal incision in the  vastus lateralis over the lag bolt and this was removed with a  screwdriver without too much difficulty and once again the greater  trochanter did appear to have a union at this point.  We then placed  cobra retractors between the gluteus minimus and the superior hip joint  capsule and between the quadratus femoris and the inferior hip joint  capsule.  This exposed the short external rotators and piriformis, which  were then tagged with a #2 Ethilon suture and cut off their insertion  exposing the hip joint capsule, which was developed into an  acetabular  based flap and incised going posterior-superior of the acetabulum out  over the insertion on the femoral neck and then back posterior, inferior  to the acetabulum.  That flap was also tagged with a #2 Ethibond sutures  exposing the femoral head.  In this portion the fracture presently was  not united and there was obvious wiggle of the femoral head on the  fracture, and one across the base of the femoral neck.  Using a power  saw, we then resected the femoral head going down to the level of the  lesser trochanter to get good bone.  We then evaluated the bone near the  lesser trochanter going out to the greater trochanter and one-piece  __________ to an SROM prosthesis.  The posterior-inferior wing retractor  was then placed.  A spike cover was placed in the cotyloid notch and  superiorly we placed a Homan retractor off the superior anterior column,  allowing [place] to translate the femur anteriorly.  This exposed the labrum,  which was then excised and allowed [place] to ream up to a 51-mm basket  reamer obtaining good coverage in all quadrants and we just reamed the  edge with a 52 basket reamer followed by insertion of the 52 ASR cup in  45 degrees of abduction and about 20 degrees of anteversion with good  fit and fill and no supplemental fixation was needed.  Hip was then  flexed, internally rotated exposing the proximal femur, which was  distorted, but at this time, it was relatively easy to expose and remove  the trochanteric nail, which gave [place] cleaned shot down the shaft of the  femur.  We then sequentially reamed up to a 14-mm cylindrical reamer  where we obtained chatter in 0.5-mm increments.  We went up to 15.5 and  then the proximal femur was reamed with a 16-mm reamer.  We then  conically reamed up to a 20 D cone and once again, the bone quality  across the greater trochanteric region was good enough to hold the cone  and there was no evidence of nonunion there.  At this  point, the calcar  milling device was brought out and we milled into the calcar, which  because of the nature of the fracture was almost in 80 degrees of  anteversion.  We reamed to a small calcar.  We were satisfied with the  reaming, a 20 D small cone was brought up onto the field trial and  inserted followed by trial stem with 42 base neck and setting the  anteversion in about 20 degrees.  Using an NK+0 46 trial ball, the hip  was reduced and stability was noted to be excellent as well as the leg  lengths.  We then took the hip through range of motion.  In full  extension it could not be dislocated in external rotation, in flexion of  90, you could internally rotate to almost 70 degrees before instability  was noted and at this point we went ahead and got an x-ray of the femur  in the AP plane to confirm the position of the trial stem on the greater  trochanter and make sure there were no occult cracks.  At this point,  the trial components were removed and a 20 D small ZTT I cone was  hammered into place followed by a 20 x 15 x 42 x 160 stem that was  retroverted about 30 degrees in relation to the calcar setting, the  actual anteversion at about 20 degrees on the stem.  This seated nicely.  We then hammered into place an NK+0 46-mm ultimate ball and once again  reduced the hip and check stability and was found to be excellent as was  the leg length.  At this point, the wound was irrigated out with normal  saline solution.  No significant bleeding was noted.  The capsular flap  and short external rotator was repaired back to the intertrochanteric  crest through drill holes with a #2 Ethibond suture.  The IT band was  closed with running #1 Vicryl suture.  The subcutaneous tissue with 0  and 2-0 undyed Vicryl suture, and the skin with skin staples.  We also  used staples in the stab wound used to remove the locking screw.  A  dressing of Mepilex was then applied.  The patient was  undraped, rolled  supine, awakened, and taken to the recovery room without difficulty.     Feliberto Gottron. Turner Daniels, M.D.  Electronically Signed    FJR/MEDQ  D:  02/28/2008  T:  02/29/2008  Job:  [id number]

## 2010-10-29 NOTE — Discharge Summary (Signed)
[name], [name]                 ACCOUNT NO.:  [id number]   MEDICAL RECORD NO.:  [id number]          PATIENT TYPE:  INP   LOCATION:  3003                         FACILITY:  [facility]   PHYSICIAN:  Hollice Espy, M.D.DATE OF BIRTH:  August 25, 1933   DATE OF ADMISSION:  08/17/2008  DATE OF DISCHARGE:  08/21/2008                               DISCHARGE SUMMARY   PRIMARY CARE PHYSICIAN:  Donia Guiles, M.D.   DISCHARGE DIAGNOSES:  1. Colonic ileus, now resolved.  2. Severe constipation felt to be secondary to narcotic medications,      now resolved.  3. Nausea with vomiting secondary to #1, now resolved.  4. Hypertension.  5. History of peripheral neuropathy.   DISCHARGE MEDICATIONS:  1. Aspirin 325.  2. Lipitor 20.  3. Zyrtec 10,  4. Lyrica 50 b.i.d.  5. Metoprolol 25 b.i.d.  6. Percocet 5/325 q.6 h p.r.n.  7. Imipramine p.o. daily.   DISCHARGE DIET:  Heart-healthy diet.   ACTIVITY:  Slowly increase.   DISPOSITION:  Improved.   The patient will also be recommended on the following over-the-counter  medicines, stool softener daily and if she does not have a bowel  movement every 24 hours, to take an over-the-counter laxative such as  MiraLax.  She  understands.   FOLLOW UP:  Followup with primary care physician, Dr. Izora Ribas in 1 month  time.   HOSPITAL COURSE:  The patient is 77 year old white female with past  medical history of chronic pain, hyperlipidemia, hypertension  presented  with complaints of severe abdominal pain, nausea and vomiting.  She was  found by x-ray to have a large amount of stool in her colon as well as  an ileus.  The patient was made n.p.o., started on IV Reglan and given a  Fleet's enema.  She did not have any improvement with this.  Her Reglan  doses were increased.  She was also put on Colace 200 b.i.d. and given a  bottle of mag citrate. After several hours she had several large bowel  movements.  Follow-up x-rays in the next several days  showed still some  persistent stool.  A second bottle of mag citrate was given on August 21, 2008.  She had four more large bowel movements. An x-ray done showed  resolution of ileus and stool and the patient was felt to be resolved.  She was feeling much better by the afternoon of August 21, 2008 and she  was discharged to home.   She understands that the cause of this was likely from narcotics and she  will continue to have a goal of  daily bowel movement with daily stool  softener and if no bowel movement, she will take an over-the-counter  laxative.   The patient being discharged to home.      Hollice Espy, M.D.  Electronically Signed     SKK/MEDQ  D:  08/21/2008  T:  08/21/2008  Job:  [id number]   cc:   Donia Guiles, M.D.

## 2010-11-01 NOTE — Discharge Summary (Signed)
[facility]  Patient:    [name], [name]                        MRN: [id number] Adm. Date:  [id number] Disc. Date: [id number] Attending:  Jetty Duhamel T CC:         Desma Maxim, M.D.  Chevis Pretty, M.D.  Charolett Bumpers III, M.D.  Reynolds Bowl, M.D.  Cristi Loron, M.D.   Discharge Summary  DATE OF BIRTH:  Jun 22, 1933  CONSULTATIONS:  Dr. Criss Alvine.  DISCHARGE DIAGNOSES:  1. Generalized weakness and deconditioning secondary to prolonged hospital     stay.  2. Status post left colectomy secondary to ischemic bowel July 15, 2000.  3. Orthostatic hypotension.  4. History of hypertension.  5. Transient acute renal failure secondary to hypotension.  6. Left dorsal hand hematoma secondary to minor trauma, resolving.  7. History of chronic headaches.  8. Heavy tobacco abuse.  9. Peripheral vascular disease status post bilateral carotid endarterectomy     November 2000. 10. Recent right forearm fracture May 11, 2000. 11. History of left subdural hematoma status post drainage January 2000. 12. History of PENICILLIN allergy. 13. Postoperative normochronic anemia; hemoglobin 10.1  DISCHARGE MEDICATIONS:  1. Altace 10 mg p.o. q.d.  2. Premarin 1.25 mg p.o. q.d.  3. Coated aspirin 325 mg p.o. q.d.  4. Claritin one p.o. q.d.  5. Nicotine patches as directed.  DISCHARGE INSTRUCTIONS:  Patient must discontinue smoking.  She must drink two to three quarts of fluids a day until no longer dehydrated.  She is to take her temperature every day and anytime she thinks she may have a fever and to notify Dr. Arvilla Market if it is 101 or higher.  She should record her blood pressure with the assistance of her RN friend Friday, Monday, Wednesday to show Dr. Arvilla Market at followup appointment.  FOLLOWUP:  Patient is scheduled with Dr. Arvilla Market on August 05, 2000 at 11:30 a.m.; Dr. Carolynne Edouard, she is await contact by his office to schedule  a followup.  HISTORY OF PRESENT ILLNESS:  Patient is a 77 year old woman who had a prolonged hospital stay at [facility] where she presented with ischemic bowel requiring hemicolectomy, generalized weakness and poor social support, requiring further reconditioning in the subacute care unit.  HOSPITAL COURSE:  [name] had a fairly unremarkable stay while in the subacute care unit and was discharged in stable condition.  Her diet was somewhat marginal and her fluid intake poor which probably contributed to her mild dehydration and orthostatic changes leading to some dizziness, but of a level that she could accommodate with caution and deliberation and position change.  She had episodic low-grade fever which was nonfocal, similar to her course at [facility].  Her right forearm splint was removed and she will not require further followup from an orthopedic standpoint.  Just prior to discharge she developed a left dorsal hand hematoma secondary to minor injury which is already resolving at the time of discharge and she has required nothing more than ice packs for continued resolution.  She gives a history of chronic dizziness.  She has seen Dr. Marcelino Freestone and was entering the idea of a self-referral to [facility] as recently as last fall.  This was never arranged as she became ill for other reasons.  She wishes to reconsider this following further recuperation from this hospitalization.  The day prior to  discharge [name] developed a temperature of 100.6 which was asymptomatic.  This resolved without intervention.  Urinalysis was obtained and was negative.  She will continue to record her temperatures and will notify Dr. Fonnie Birkenhead office should it reach 101 or higher at which point reconsideration to hospital-acquired infections would need to be made.  [name] has mild clinical dehydration at the time of discharge.  She is able to eat and  drink, and insists on discharge stating that she will be able to drink two to three quarts of liquid a day as opposed to staying in the hospital another night for IV fluids.  Her common law husband is aware of these instructions and will assist her, and home therapies will be observing for symptoms as well.  Early followup is arranged for [name]. DD:  08/11/00 TD:  08/12/00 Job: [id number]

## 2010-11-01 NOTE — Op Note (Signed)
[facility]  Patient:    [name], [name]                        MRN: [id number] Proc. Date: 07/15/00 Adm. Date:  [id number] Attending:  Jetty Duhamel T                           Operative Report  PREOPERATIVE DIAGNOSIS:  Ischemic colitis.  POSTOPERATIVE DIAGNOSIS:  Ischemia colitis.  OPERATION:  Descending colectomy with primary re-anastomosis including mobilization of the splenic flexure of the colon.  SURGEON:  Chevis Pretty, M.D.  ASSISTANT:  Zigmund Daniel, M.D.  ANESTHESIA:  General endotracheal anesthesia.  DESCRIPTION OF PROCEDURE:  After informed consent was obtained, the patient was brought to the operating room and placed in the supine position on the operating table.  After adequate induction of general anesthesia, the patients abdomen was prepped with Betadine and draped in the usual sterile manner.  An incision was made from just above the umbilicus to the pubic symphysis with a #10 blade knife.  This incision was carried down through the skin and subcutaneous tissue using Bovie electrocautery until the linea alba was identified.  The linea alba was incised with the Bovie electrocautery, and the preperitoneal space was probed with hemostats until the peritoneum was identified.  The peritoneum was then grasped between the hemostats, and care was taken to make sure no viscera was between the hemostats.  The peritoneum was then opened with the Bovie electrocautery.  Once inside the abdomen, fingers were inserted into the abdomen, and the rest of the incision was opened under direct vision.  A Balfour retractor with an extender was then applied to the abdominal wall.  The small bowel was then packed into the right upper quadrant with moist laps, and a malleable retractor on the extender arm of the Balfour retractor was used to hold this in place out of the way.  The colon was identified.  The sigmoid colon and rectum all appeared to  be normal.  A short segment of the descending colon was identified that appeared thickened and erythematous, and the proximal descending at the splenic flexure appeared to be normal. The rest of the colon was also inspected and appeared to be normal, and the small-bowel contents were run in its entirety and also appeared to be normal.  The descending and sigmoid colon were then mobilized by incising its lateral attachments to the retroperitoneum along the white line of Toldt using the Bovie electrocautery.  Next, using a combination of blunt and finger dissection as well as sharper dissection with the Bovie electrocautery, the descending and splenic flexure of the colon as well as the sigmoid colon were all mobilized up into the wound.  A site was chosen both proximal and distal to the diseased segment of descending colon where the bowel appeared to be more normal, and the mesentery adjacent to the bowel wall was incised with the Bovie electrocautery at each of these point, and blunt dissection was also used until this portion of the colon, both proximally and distally, were surrounded circumferentially and clear of any serosal debris.  Each of these portions of the colon were then divided with a single firing of the GIA75 stapler.  The mesentery of the involved descending colon was then taken down by serially clamping with Kelly clamps, dividing with Metzenbaum scissors, and ligating with 2-0 silk ties high on the mesentery  and closer to the bowel. Once this was complete, the involved segment of descending colon was removed from the abdomen and sent to the pathologist for further evaluation.  The proximal and distal segment of colon then were examined and easily came together.  A 2-0 silk anchoring stitch was placed near the staple line on the antimesenteric border of each of these pieces of bowel.  Bovie electrocautery was used to make a small enterotomy along the tinea coli on each of  these segments of bowel, and each of these segments of bowel were lined up in a side-to-side fashion.  Each arm of the Endo GIA was then placed down each side of the closely approximated colon, and the antimesenteric borders were closely reapproximated, taking care to keep any fatty epiploic appendages out of the clamp site of the GIA stapler.  The GIA stapler was then clamped and fired and then removed.  The lumen of the two pieces of bowel was examined, and the anastomosis appeared to be widely patent and hemostatic.  Allis clamps were then used to reapproximate the edges of the enterostomy that was left, and a single firing of the TA-60 stapler was placed across this opening to seal it shut.  The staple lines were then reinforced with several 3-0 silk Lembert stitches.  The bowel was then re-examined and appeared to be completely viable, and the anastomosis was palpated and was widely patent.  The mesenteric defect was then closed with a running 3-0 PDS suture.  The abdomen was then irrigated with copious amounts of saline.  The abdominal wall fascial layer was closed with two running #1 PDS sutures.  The skin and subcutaneous tissue were irrigated again with saline, and the skin was reapproximated with staples.  A sterile dressing was applied.  The patient tolerated the procedure well.  At the end of the case, all sponge, needle, and instrument counts were correct.  Of note, when mobilizing the descending and sigmoid colon, the ureter was identified and kept well deep to any of the dissection.  The patient was awakened and taken to the recovery room on stable condition. DD:  07/16/00 TD:  07/18/00 Job: [id number]

## 2010-11-01 NOTE — H&P (Signed)
[facility]  Patient:    [name], [name]                        MRN: [id number] Adm. Date:  [id number] Attending:  Redmond Baseman CC:         Desma Maxim, M.D.   History and Physical  DATE OF BIRTH:  27-Aug-1933.  PATIENT PROFILE:  She is a 77 year old woman complaining of abdominal pain and diarrhea since yesterday and rectal bleeding since late last night.  Her primary care physician is Dr. Desma Maxim at [facility].  HISTORY OF PRESENT ILLNESS:  [age]-year-old white female complaining of generalized abdominal pain, worse on the left than the right, since yesterday with diarrhea with nausea, no vomiting, no fever, but she did have chills. Bleeding started last night, bright red blood when she looked at her stool, and when she stands, bright red blood pours out of the rectum.  She is dizzy when she stands up.  Patient was evaluated at the walk-in clinic at [facility] and found to have rectal bleeding and hypotensive and transferred by EMS for further treatment and admission at [facility].  I met her on arrival and admitted her directly.  PAST MEDICAL HISTORY 1. Hypertension. 2. Menopause. 3. Allergies.  CURRENT MEDICATIONS 1. Altace 5 mg q.d. 2. Premarin 1.25 mg q.d. 3. Regular Bayer aspirin. 4. Allegra one q.d. 5. Pepto-Bismol this a.m.; this morning, she started that because of the    diarrhea.  PAST SURGICAL HISTORY:  In January of 2000, had a blood clot removed from the left side of the brain.  In March of 2000, a right carotid endarterectomy.  In November of 2000, a left carotid endarterectomy.  In March of 2001, had a right cataract surgery; in May of 2001, left cataract surgery. May 11, 2000, fracture of her right forearm in two places.  ALLERGIES:  All CILLINS "even produce a rash."  SURGEONS AND SPECIALISTS 1. Dr. Cristi Loron is her neurosurgeon and removed a  clot off her    brain. 2. [facility], Dr. Di Kindle. Edilia Bo, Physiological scientist. 3. Dr. Remi Deter L. Chales Abrahams is her ophthalmologist. 4. Dr. Reynolds Bowl is her orthopedist.  SOCIAL HISTORY:  Smokes three-quarters pack per day for 50 years.  No alcohol. No drugs.  Occupation:  Retired; she used to be ______ .  She is single, divorced, living with a man for 19 years.  FAMILY HISTORY:  She has two children, one boy and one girl, alive and well. Mom died of diabetic ketoacidosis; dad died of old age.  No sisters.  One brother who died of an MI at 21, last year.  REVIEW OF SYSTEMS:  Besides feeling dizzy, there is no chest pain, palpitations or pedal edema.  She does occasionally have a little cough.  No shortness of breath or chest tightness.  ABDOMEN:  Generalized abdominal discomfort; history as above.  GU:  Negative.  GYN:  Negative.  CNS: Dizziness.  SCREENING:  She had a flexible sigmoidoscopy two to three years ago.  PHYSICAL EXAMINATION  GENERAL:  White female, awake, alert, cooperative.  VITAL SIGNS:  Blood pressure 130/80 and heart rate of 100 on lying down; sitting her up with her legs hanging over the edge of the gurney, her blood pressure was 115/70, heart rate 115.  Respiratory rate 20.  Temperature 100.1.  HEENT:  Upper dentures.  Mucous membranes  pink and moist.  Ears clear.  Nose clear.  Throat clear.  NECK:  Supple.  JVD down.  Carotids 1+ bilaterally.  No bruits.  LUNGS:  Clear.  CARDIOVASCULAR:  S1 and S2.  Tachycardic, heart rate 100 to 105.  No murmurs. No S3 or S4.  ABDOMEN:  Soft.  Very tender along the left side of the abdomen and left flank.  She has mild voluntary guarding.  No rebound.  No masses.  No bruits. No liver or kidney or spleen felt.  Bowel sounds were increased.  RECTAL:  No masses felt.  There was frank bright red blood on the glove coming from the rectum.  She had a pad on with blood staining.  PELVIC:  Deferred.  NEUROLOGIC:   Nonfocal.  EXTREMITIES:  Warm.  Tibial pulses 1+ in the lower extremities and 1+ radial pulses.  SKIN:  Dry.  IMPRESSION 1. Abdominal pain and acute gastrointestinal bleed, unclear etiology,    possibility of diverticulitis with lower gastrointestinal bleed, ischemic    bowel, arteriovenous malformation. 2. History of peripheral vascular disease. 3. Tobacco smoker. 4. Postmenopausal. 5. History of allergies. 6. History of hypertension. 7. Presently tilt-positive orthostatic hypotension.  PLAN 1. Admit to step-down unit. 2. STAT GI consult -- Dr. Charolett Bumpers III will see now. 3. STAT surgical consult -- surgeon will see; he is presently in the OR. 4. IV fluids of normal saline 1 L STAT, then 125 cc/hr. 5. STAT labs:  CBC, PT, PTT, CMET, amylase, lipase.  EKG STAT.  Abdominal    series STAT.  Abdominal and pelvic CT without contrast to expedite CT.    Type and cross two units on hold.  Hemoglobin and hematocrit every four    hours. 6. Start IV Cipro and Flagyl. 7. Adjustment of treatment will be made as necessary. DD:  07/06/99 TD:  07/06/00 Job: [id number]

## 2010-11-01 NOTE — Consult Note (Signed)
[facility]  Patient:    [name], [name]                        MRN: [id number] Proc. Date: 07/05/00 Adm. Date:  [id number] Attending:  Redmond Baseman                          Consultation Report  HISTORY OF PRESENT ILLNESS:  Ms. Stinnette is a 77 year old white female who has had several days of intermittent crampy, sharp abdominal pain.  These pains have been somewhat generalized over her entire abdomen.  Yesterday, she developed watery diarrhea throughout the day.  Last night, she initially noted some spotting when she wiped after these bouts of diarrhea and this morning, began passing bright red blood per rectum.  She has never had any problems with this in the past.  She has had a poor appetite for the last several weeks.  This has not been worsened by eating.  She denies any food fear.  She has not lost any weight recently.  She denies any fevers, chills, chest pain or shortness of breath.  She does not walk very much because of dizzy feelings related to previous blood clot in her brain.  She denies any claudication-type symptoms, although she does not walk very far because of her dizziness.  Her other review of systems is unremarkable.  PAST MEDICAL HISTORY:  Her past medical history is significant for hypertension, carotid artery disease, menopause, history of multiple small strokes, blood clot in the brain, cataracts and a fractured right forearm.  PAST SURGICAL HISTORY:  Surgery to remove the blood clot from her brain back in January of 2000, a right carotid endarterectomy in March of 2000, left carotid endarterectomy in November of 2000, right cataract surgery in March of 2001, left cataract surgery in May of 2002 and repair of a fractured right forearm in November of 2001 as well.  She has had a laparoscopic tubal ligation.  MEDICATIONS 1. Altace 5 mg q.d. 2. Premarin 1.25 mg q.d. 3. Aspirin one a day. 4. Allegra one pill a day. 5.  Pepto-Bismol p.r.n.  ALLERGIES:  ALL PENICILLINS and CODEINE, both of which cause a rash.  SOCIAL HISTORY:  She smokes about a half to three-quarters of a pack of cigarettes a day.  Denies any alcohol use.  FAMILY HISTORY:  Her family history is remarkable only for diabetes in her mother.  PHYSICAL EXAMINATION  GENERAL:  She is a well-developed, well-nourished, elderly white female lying in bed.  SKIN:  Her skin is warm and dry with no jaundice.  HEENT:  Her extraocular muscles are intact.  She has had cataract surgery bilaterally.  NECK:  No bruits, with bilateral carotid endarterectomy scars.  LUNGS:  Clear to auscultation bilaterally.  HEART:  Regular rate and rhythm with distant heart sounds.  ABDOMEN:  Soft, diffusely tender to palpation and somewhat out of proportion to her physical findings.  She has normal bowel sounds.  EXTREMITIES:  She has no clubbing, cyanosis, or edema.  She has 1+ palpable posterior tibial pulses bilaterally and equal, 2+ femoral pulses bilaterally and equal.  NEUROLOGIC:  She is alert and oriented x 4.  ______  LABORATORY FINDINGS:  Her lab work is significant for a white count of 12,200 with 77% segs, 17 lymphs, 4% mono.  She has a hemoglobin of 13.9 and hematocrit of 42.6 and a platelet count of 258,000.  Her PTT is 28, her PT is 12.8 and her INR is 1.0.  Her EKG shows sinus tachycardia.  ASSESSMENT AND PLAN:  This is a 77 year old white female with new onset of abdominal pain and rectal bleeding.  It is unclear as to the etiology of these two.  She certainly is a risk for mesenteric ischemia, given her other vascular disease, although food intake does not affect this and she has not lost any weight.  Certainly, an ischemic bowel from an embolic source is also possible, although she has not had any history of atrial fibrillation.  I agree with obtaining a CT scan of her abdomen and pelvis with oral contrast to try to figure out what  the cause of her abdominal pain may be. Gastroenterology has also been consulted for possible colonoscopy if she has any further blood per rectum.  We will follow along while she is in the hospital.  Thank you for allowing [place] to participate in the care of this patient. DD:  07/05/00 TD:  07/06/00 Job: [id number]

## 2010-11-01 NOTE — H&P (Signed)
NAMEOLIVIA, [name]                 ACCOUNT NO.:  [id number]   MEDICAL RECORD NO.:  [id number]          PATIENT TYPE:  INP   LOCATION:  1826                         FACILITY:  [facility]   PHYSICIAN:  Melissa L. Ladona Ridgel, MD  DATE OF BIRTH:  [birth year]   DATE OF ADMISSION:  11/07/2004  DATE OF DISCHARGE:                                HISTORY & PHYSICAL   CHIEF COMPLAINT:  I had a bad stomach ache.   PRIMARY CARE PHYSICIAN:  Donia Guiles, M.D.   HISTORY OF PRESENT ILLNESS:  The patient is a 77 year old white female who  developed abdominal pain after having breakfast this morning. She described  it as 10 out of 10 located in the central abdomen radiating to her flanks  bilaterally. She states she has nausea but did not vomit. She states that  she felt like moving her bowels this morning but sat on the toilet and could  not go. She states she took two Pepto-Bismol but this did not help. She then  called her primary care physician who recommended that she come in to the  hospital or go to the Urgent Care Center.   The patient's states she has never had this problem before.   REVIEW OF SYMPTOMS:  No fever and no chills. Positive nausea and no  vomiting. Positive constipation. Positive tenesmus. No melena, no  hematochezia, no hematemesis. No changes in vision. All other review of  systems are negative.   PAST MEDICAL HISTORY:  1.  Hypertension.  2.  Hypercholesterolemia.  3.  She has balance issues.  4.  Neuropathy.  5.  Diverticulitis.  6.  Migraines.  7.  Restless leg syndrome.   PAST SURGICAL HISTORY:  1.  She had a left kidney removed.  2.  She had a hysterectomy.  3.  She had subdural hematoma versus subarachnoid hemorrhage in 2000      requiring a cranial surgery.  4.  She had eight inches of her colon removed secondary to bleeding, likely      secondary to diverticulitis.  5.  She had a right carotid endarterectomy.   SOCIAL HISTORY:  She smokes a pack a day. She denies  any ethanol use and she  used to work as a Catering manager.   FAMILY HISTORY:  Mom is deceased with diabetes, hypertension, and  hypercholesterolemia. Dad is deceased secondary to elderly age with no  medical conditions according to her. She has three children.   ALLERGIES:  CODEINE and PENICILLIN.   MEDICATIONS:  1.  Aspirin 325 mg daily.  2.  Metoprolol, she takes a half a tablet b.i.d. but does not want the      initial milligrams are.  3.  She takes Sular but does not remember the dosage.  4.  She takes Premarin 1.25 mg daily.  5.  She takes 2 tablets of fish oil daily.  6.  She takes Zyrtec 10 mg daily.  7.  Zetia 10 mg daily.  8.  Lipitor 10 mg daily.  9.  She thinks she takes Imipramine 10 mg daily.   LABORATORY DATA:  White count is 15.4, hemoglobin is 15.9, hematocrit is  47.7, platelets are 277,000. Her sodium is 137, potassium 4.6, chloride 102,  CO2 23, BUN is 13, creatinine is 1.4. Glucose is 102, LFTs are within normal  limits. Her lipase is 29. Her UA has trace leukocyte esterase, 3 to 6 wbc's.   CT of the abdomen shows constipation. There is an area of transition from  dilated proximal colon to normal caliber distal bowel loops compatible with  small bowel obstruction. There is a low attenuation mass that has increased  in attenuation since previous studies likely consistent with a cyst but may  represent other things and should have an ultrasound completed during the  course of this hospital stay. She also has a ventral hernia that contains  fat and no incarceration.   PHYSICAL EXAMINATION:  VITAL SIGNS:  Temperature is 98.4, blood pressure is  113/65, pulse is 83, respirations 16. Saturation is 91%.  GENERAL:  The patient is in minimal distress secondary to abdominal  discomfort.  HEENT:  She is normocephalic and atraumatic. Pupils are equal, round, and  reactive to light. Extraocular muscles are intact. Moist mucus membranes.  NECK:  Supple. There is no JVD. She  has a well-healed right carotid  endarterectomy scar. She has no lymph nodes and no carotid bruits.  CHEST:  Clear to auscultation. There is no rhonchi, rales, or wheezes.  CARDIOVASCULAR:  Regular rate and rhythm. Positive S1 and S2. No S3, S4,  murmurs, rubs, or gallops.  ABDOMEN:  Soft. Positive bowel sounds. She is minimally distended. There are  no indications for high pitched bowel sounds. She is tender across mostly  the entire abdomen but with concentration in the upper abdomen.  EXTREMITIES:  No clubbing, cyanosis, or edema.  NEUROLOGICAL:  She is awake, alert, and oriented. Cranial nerves II through  XII are intact. Strength is 5/5. Deep tendon reflexes are 2+.   ASSESSMENT:  This is a 77 year old white female with previous history of  abdominal surgery who presents with acute onset of abdominal pain after  breakfast this morning. The patient was found to have a small bowel  obstruction on CAT scan. On physical exam, the patient is currently mildly  uncomfortable but stable.   PLAN:  1.  Cardiovascular:  She has a history of hypertension. She is currently      stable. We need to clarify her Lopressor order. At this time, I would      like to hold the Lopressor and her aspirin but we need to speak with our      pharmacy in the morning. We can use p.r.n. Lopressor if needed but her      blood pressure is on the lower side. For now, we will monitor. We will      hold her Zetia and Lipitor while she is NPO. We will treat      hypercholesterolemia.  2.  Pulmonary:  She has tobacco abuse and seasonal allergies. We will      continue her on Zyrtec and obtain a smoking cessation consult with a      Nicoderm patch p.r.n.  3.  GI:  She has a small bowel obstruction. She is to be NPO with bowel      rest. She can have p.r.n. NG tube suctioning based on her symptoms of      nausea, vomiting, and distension. We will use IV Protonix for now and     considering starting a softener  when she  is able to take p.o.  4.  GU:  She has strict I&Os with trace leukocyte esterase. I would like to      start her on antibiotics.  5.  ID:  She has increased white count but no fever. With trace leukocyte      esterase and elevated white count, we are going to start her on Cipro IV      q.12h. which were also cover her abdomen.  6.  Restless leg syndrome:  She can have Imipramine 10 mg p.o. q.h.s. p.r.n.  7.  DVT prophylaxis:  As the patient may need surgical intervention at some      point, I will start her on subcutaneous heparin at 5000 units q.8h.      p.r.n. which can be easily reversed if necessary.       MLT/MEDQ  D:  11/08/2004  T:  11/08/2004  Job:  [id number]   cc:   Donia Guiles, M.D.  [address] [place]  [place] [address]  Fax: [phone]

## 2010-11-01 NOTE — Discharge Summary (Signed)
[name], [name]                 ACCOUNT NO.:  [id number]   MEDICAL RECORD NO.:  [id number]          PATIENT TYPE:  INP   LOCATION:  5015                         FACILITY:  [facility]   PHYSICIAN:  Harvie Junior, M.D.   DATE OF BIRTH:  Nov 25, 1933   DATE OF ADMISSION:  04/04/2008  DATE OF DISCHARGE:  04/12/2008                               DISCHARGE SUMMARY   ADMITTING DIAGNOSES:  1. Left femur fracture, periprosthetic fracture.  2. 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  3. Hypertension.  4. Chronic obstructive pulmonary disease.  5. History of subdural hematoma.  6. Neuropathy.  7. Seasonal allergies.  8. Depression.   DISCHARGE DIAGNOSES:  1. Left femur fracture, periprosthetic fracture.  2. 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  3. Hypertension.  4. Chronic obstructive pulmonary disease.  5. History of subdural hematoma.  6. Neuropathy.  7. Seasonal allergies.  8. Depression.  9. Acute blood loss anemia.   CONSULTATIONS:  [facility] hospitalist, Corinna L. Lendell Caprice, MD for medical  management.   PROCEDURES:  Open reduction and internal fixation of left femur, Jodi Geralds M.D. on April 05, 2008.   BRIEF HISTORY:  Ms. Forrester is a 77 year old hypertensive female who has a  history of subdural hematoma.  She fell injuring her left femur on the  day of admission.  She complained of left leg pain, inability to weight  bear on it.  She has a history of a left hip fracture in July with a  recent total hip replacement for a nonunion of a left hip fracture.  She  fell and presented to the emergency room complaining of left leg pain.  X-ray showed a displaced left distal femur fracture with a left hip  replacement in good position and alignment.  She was admitted for  treatment of this significant left femur fracture.   PERTINENT LABORATORY STUDIES:  EKG on April 04, 2008, showed normal  sinus rhythm  with sinus arrhythmia and left bundle-branch block.  Chest  x-ray showed no acute findings with COPD on April 06, 2008.  X-ray of  the left hip on April 05, 2008, showed status post ORIF of distal left  femoral diaphyseal fracture with lateral plate and multiple cerclage  wires.  X-ray of the left femur showed status post ORIF.  The patient's  hemoglobin on the day of admission was 11.4 and hematocrit 35.4.  On the  day of her surgery, hemoglobin was 9.9 and on postop day #1 was 10.1, it  should be followed on a daily basis through her hospitalization and she  ranged from 9.5-9.9.  Indices within normal limits.  Protime on  admission was 14.1 seconds with an INR of 1.1 and PTT of 26.  On April 07, 2008, her protime was 17.0 seconds with an INR of 1.3.  CMET on  admission showed slightly elevated BUN of 26, but no other significant  abnormalities.  BMET was followed on a daily basis  and her potassium  remained in the acceptable range and creatinine stayed acceptable within  1 day on April 05, 2008, of 1.37.  Two units of packed RBCs were  transfused on April 05, 2008.   HOSPITAL COURSE:  The patient was admitted to the hospital on April 04, 2008, and was brought to the operating room on April 05, 2008, at  8:30 p.m.  She underwent ORIF of her left femur without complications.  Preop, she was given IV antibiotics as well as postoperatively.  A  medicine consult was obtained with the [facility] hospitalist for management  of her cardiac issues, medical issues, history of left bundle-branch  block which was stable, hypertension, peripheral myelopathy, restless  leg syndrome, and perioperative medical care.  On postop day #1, she  overall was doing okay with complaints of leg pain.  She had no chest  pain or shortness breath.  She was taking fluids without difficulty.  Vital signs were stable.  O2 sats were 93% on 3 L of oxygen.  Physical  therapy was ordered for walker, ambulation,  and touchdown weightbearing  only on the left.  On postop day #2, she refused Coumadin which was  ordered for DVT prophylaxis.  She stated I will not take that rat  poison.  She does have a history of subdural hematoma and was told not  to ever take Coumadin by her physicians at that time.  Her PCA morphine  pump was discontinued.  She was started on enteric-coated aspirin twice  a day with TEDs and PAS for DVT prophylaxis.  She was continued on  additional IV fluids.  Dr. Lendell Caprice managed her medical issues without  any significant complications.  On postop day #3, she had some  complaints of nausea, no vomiting.  She was taking fluids okay.  She had  left leg pain rated at 6/10.  She was making slow progress with physical  therapy.  No bed chair transfers.  Her hemoglobin was 9.3.  Her INR was  1.3.  We discussed the importance of DVT prophylaxis.  She understands  this.  She was then started on Lovenox for DVT prophylaxis and aspirin  was discontinued.  She was not walking well, making much progress at all  on physical therapy, but she was taking fluids and voiding without  difficulty.  She was afebrile.  Her vital signs were stable.  She had  some social issues and it was not felt that she was safe to be at home  and felt she was at high risk for fall.  On April 10, 2008, she was  awaiting results from rehab consult.  We were concerned about her going  home with risk of fall there versus skilled nursing facility.  There  were some issues regarding skilled nursing facility, and after the  inpatient rehab consult was done, it was felt that she was a candidate  for this.  She is making slow progress with physical therapy and had  questionable safety with her ambulation and transfers.  At the time of  discharge, she was afebrile and her vital signs were stable.  Her left  thigh dressing was clean and dry.  The patient agreed with the  comprehensive inpatient rehab transfer.  Activity  status to be touchdown  weightbearing on the left.  Continue on all medications per the  medication reconciliation form and subcu Lovenox for DVT prophylaxis.  We will see her while in inpatient unit and we will see  her in the  office for followup when she is 2-3 weeks postop.      Marshia Ly, P.A.      Harvie Junior, M.D.  Electronically Signed    JB/MEDQ  D:  06/21/2008  T:  06/22/2008  Job:  [id number]   cc:   Donia Guiles, M.D.

## 2010-11-01 NOTE — Procedures (Signed)
[facility]  Patient:    [name], [name]                       MRN: [id number] Proc. Date: 07/10/00 Attending:  Verlin Grills, M.D. CC:         Desma Maxim, M.D.   Procedure Report  PROCEDURE:  Flexible proctosigmoidoscopy with biopsy.  INDICATION:  [name], (date of birth August 18, 1933) is a 77 year old female admitted with probable ischemic colitis involving her left colon on July 05, 2000.  Her admission abdominal pelvic CT scan was consistent with left-sided colitis.  She was placed on intravenous Cipro and Flagyl.  She has had persistent left lower quadrant abdominal pain despite antibiotic therapy.  MEDICATION ALLERGIES:  CODEINE.  CHRONIC MEDICATIONS:  Altace, Premarin, aspirin, Allegra, Pepto Bismol.  PAST MEDICAL HISTORY:  Chronic cigarette smoking, hypertension, bilateral carotid endarterectomies, bilateral cataract surgeries, fracture of the right forearm.  ENDOSCOPIST:  Verlin Grills, M.D.  PREMEDICATION:  Demerol 50 mg, Versed 7 mg.  ENDOSCOPE:  Olympus pediatric colonoscope.  DESCRIPTION OF PROCEDURE:  After obtaining informed consent, the patient was placed in the left lateral decubitus position.  I administered intravenous Demerol and intravenous Versed to achieve conscious sedation for the procedure.  The patients blood pressure, oxygen saturation, and cardiac rhythm were monitored throughout the procedure and documented in the medical record.  Anal inspection was normal.  Digital rectal exam was normal.  The Olympus pediatric colonoscope was introduced into the rectum and advanced to approximately the splenic flexure.  The patient was not given a prep for the examination today.  Rectum:  Endoscopically, the rectum appears normal.  There is a large amount of solid and liquid stool in the rectum, but the mucosa appears normal.  Sigmoid Colon:  There is confluent ischemic colitis from 30 cm  to 40 cm from the anal verge.  There are large necrotic-appearing nodular blebs with pustular exudate.  Biopsies were taken.  The mucosa from 40 cm to 50 cm from the anal verge demonstrates mild ischemic colitis characterized by patchy erythema and pseudomembrane formation with otherwise normal intervening mucosa.  Descending Colon:  The mucosa involving the descending colon to the splenic flexure appeared normal.  ASSESSMENT: Ischemic sigmoiditis.  Severe, necrotic-appearing mucosa from 30 cm to 40 cm from the anal verge.  Mild ischemic colitis from 40 cm to 50 cm from the anal verge.  The descending colon to the splenic flexure appears normal.  The rectum appears normal.  RECOMMENDATIONS:  I cannot rule out gangrenous bowel involving the distal sigmoid colon from 30 cm to 40 cm from the anal verge.  [name] may require a segmental resection of this area. DD:  07/10/00 TD:  07/10/00 Job: [id number]

## 2010-11-01 NOTE — Consult Note (Signed)
NAMELEWANDA, PEREA NO.:  [id number]   MEDICAL RECORD NO.:  [id number]          PATIENT TYPE:  INP   LOCATION:  6713                         FACILITY:  [facility]   PHYSICIAN:  Adolph Pollack, M.D.DATE OF BIRTH:  [birth year]   DATE OF CONSULTATION:  DATE OF DISCHARGE:                                   CONSULTATION   REASON FOR CONSULTATION:  Small-bowel obstruction.   HISTORY OF PRESENT ILLNESS:  Ms. Longhi is a 77 year old female who was in her  normal state of health until approximately Thursday when she developed some  cramping and twisting abdominal pain that radiated across her entire abdomen  and was associated with distention.  She was able still to pass some gas but  was not able to have a bowel movement.  She subsequently presented to the  emergency department where she was evaluated.  Part of her evaluation  included a CT of the abdomen with contrast.  This demonstrated what appeared  to be a small-bowel obstruction with a transition zone in the mid-small  bowel.  She was admitted by the medical service.  NG tube was placed.  After  approximately 36 hours of suction, this NG tube was clamped, and she was  given a liquid diet, but she began feeling poorly again.  She has continued  to pass some gas but has still not had a bowel movement.  I was subsequently  asked to see her because of that.  Currently, she says that she has some  intermittent pain but nothing severe.  She passed a little gas this morning.   PAST MEDICAL HISTORY:  1.  Ischemic colitis.  2.  Hypertension.  3.  Hypercholesterolemia.  4.  Neuropathy.  5.  Migraine headaches.  6.  Subdural hematoma.  7.  Subarachnoid hemorrhage.  8.  Peripheral vascular disease.   PREVIOUS OPERATIONS:  1.  Left nephrectomy.  2.  Hysterectomy.  3.  Craniotomy and drainage of hematoma.  4.  Partial colectomy secondary to ischemic colitis by Dr. Chevis Pretty.  5.  Right carotid endarterectomy.   ALLERGIES:  PENICILLIN and CODEINE.   HOME MEDICATIONS:  Aspirin, metoprolol, Sular, Premarin, Zyrtec, Zetia,  Lipitor, and imipramine.   SOCIAL HISTORY:  Smokes two packs a cigarettes a day and has been doing so  for 55 years.   REVIEW OF SYSTEMS:  CARDIOVASCULAR:  Hypertension but no noted heart  disease.  PULMONARY:  No known asthma or pneumonia.  NEUROLOGIC:  As per  HPI.  GI:  Did not vomit but had nausea.  Has been constipated but has been  passing some gas.   PHYSICAL EXAMINATION:  GENERAL:  An elderly female in no acute distress.  She is pleasant and cooperative.  VITAL SIGNS:  Temperature is 98.3, blood pressure is 128/59, and pulse 88.  EYES:  Extraocular motions intact.  No icterus.  NECK:  Supple without masses or obvious thyroid enlargement.  RESPIRATORY:  Breath sounds equal and clear.  Respirations unlabored.  CARDIOVASCULAR:  Regular rate and rhythm.  No JVD.  ABDOMEN:  Soft and nondistended.  There is a lower midline scar with a  reducible hernia.  Intermittent bowel sounds are heard.  NG tube is draining  some bilious material, although it is not quantitated.   LABORATORY DATA:  Remarkable for an E. coli urinary tract infection.  White  cell count today scores 6000.  Potassium today 3.4.  Hemoglobin today is  12.4.   CT scan is reviewed and demonstrates certainly some dilated small-bowel  loops with what appears to be a normal-caliber small bowel distal to an  area.  However, she had x-rays in May 27 to May 29, which demonstrate more  of a gaseous colonic distention versus dilated small-bowel loop and contrast  through to the colon.   IMPRESSION:  1.  Partial small-bowel obstruction as contrast has traversed the colon.      Was given a short trial of nasogastric tube before feeding but may      deserve a little bit longer trial to see if this will resolve.  Contrast      has traversed the small bowel and is in the colon.  No peritonitis.  2.  E. coli UTI.  3.   Hypokalemia.   PLAN:  Continue NG output and record every 8 hours.  Repeat x-rays tomorrow.  Correct the potassium.  I told her if she is not clinically improved in 1 to  2 days, she may need an operation.  We went over exploratory laparotomy, and  I explained the procedure and the risks.  The risks include but not limited  to bleeding, infection, damage to her abdominal organs, risks of anesthesia,  as well as wound healing problems.  She seems to understand this.  She said  she would like to wait as long as she could to see if the process will work  itself out prior to having surgery.      TJR/MEDQ  D:  11/11/2004  T:  11/11/2004  Job:  [id number]   cc:   Donia Guiles, M.D.  [address] [place]  [place] [address]  Fax: [phone]

## 2010-11-01 NOTE — Discharge Summary (Signed)
[facility]  Patient:    [name], [name]                        MRN: [id number] Adm. Date:  [id number] Disc. Date: 07/23/00 Attending:  Rosanne Sack CC:         Desma Maxim, M.D. [facility]  Dr. Adrian Blackwater, M.D.  Charolett Bumpers III, M.D.   Discharge Summary  DATE OF BIRTH:  01-Aug-1933  DISCHARGE DIAGNOSES:  1. Ischemic bowel requiring left hemicolectomy (July 15, 2000).  2. Acute renal insufficiency secondary to prerenal azotemia, resolved.  3. Hypokalemia, improved.  4. Hypertension.  5. Chronic migraine headaches.  6. Chronic smoking 3 packs per day x more than 50 years.  7. Peripheral arteriosclerotic vascular disease.     a. Status post bilateral endarterectomy in November 2000.  8. Recent right forearm fracture (May 11, 2000).     a. Followup right arm cast by Dr. Criss Alvine.  9. History of left subdural hematoma requiring drainage in January 2000 by     Dr. Lovell Sheehan. 10. Postoperative normocytic anemia, hemoglobin 10.1, MCV 91. 11. Menopause, on hormone replacement therapy. 12. Allergy to penicillin.  DISCHARGE MEDICATIONS: 1. Zocor 20 mg p.o. q.d. 2. Enteric-coated aspirin 325 mg p.o. q.d. 3. Premarin 1.25 mg p.o. q.d. 4. Tylenol 500 mg one or two tablets q.6h. p.r.n. 5. Protonix 40 mg b.i.d. 6. Altace 5 mg p.o. q.d. 7. Klonodine 0.1 mg b.i.d. 8. Imitrex 25 mg p.o. t.i.d. p.r.n. 9. Nicotine patch 14 mg patch q.d.  CONSULTATIONS: 1. Dr. Reece Agar, GI. 2. Dr. Carolynne Edouard (general surgery).  PROCEDURES: 1. Abdominal and pelvic CT scan consistent with colonic wall thickening,    compatible with colitis versus ischemia versus inflammatory bowel disease. 2. Flexible sigmoidoscopy (July 10, 2000), consistent with severe necrotic    sigmoid ischemic bowel extending about 10 cm in length. 3. Status post left hemicolectomy on July 13, 2000, by Dr. Carolynne Edouard.  DISCHARGE LABORATORY DATA:  Sodium  140, potassium 3.6, chloride 110, CO2 28, BUN 6, creatinine 1.1, glucose 108.  Pathology result from the surgical samples consistent with necrosis with ulceration of focal granulation tissue, consistent with ischemic colitis.  Lipase 52.  Guaiac negative stools. Hemoglobin 10.1, MCV 91, white blood cells 5.2, platelets 458.  LFTs within normal limits.  Negative stool for C. diff toxin.  Stool culture consistent with normal flora.  HOSPITAL COURSE:  [name] is a very pleasant 77 year old female admitted to [facility] on July 05, 2000, with recurrent intractable abdominal pain.  Please see admission H&P by Dr. ______ for further details regarding the history of presentation, physical exam, and laboratory data.  #1 - ISCHEMIC COLITIS REQUIRING LEFT HEMICOLECTOMY:  The patient presented with left-sided abdominal pain associated with a leukocytosis of 12,000 and a left shift.  Dr. Gaylan Gerold obtained a GI and surgical consult on day #2.  Both Dr. Carolynne Edouard and Dr. Reece Agar agreed with the broad spectum empiric antibiotic therapy and bowel rest.  The patient became afebrile, though despite two day of bowel rest, her abdominal pain returned once she was placed on clear liquids.  A CT scan of the abdomen was consistent with a distending colonic wall thickening worrisome for colitis versus ischemic bowel.  Due to the patients fever, leukocytosis, and diarrhea, stool studies were obtained to rule out infectious colitis.  C. diff and stool cultures were negative.  Dr. Reece Agar  performed a flexible sigmoidoscopy on July 10, 2000, which revealed ischemic colitis.  Despite a conservative approach with bowel rest and empiric broad spectrum antibiotic therapy, the patient had persistent left-sided abdominal pain despite her diarrhea subsiding.  For this reason, Dr. Carolynne Edouard performed a left hemicolectomy on July 15, 2000.  The pathology report was consistent with ischemic colitis without  evidence of malignancy. The postoperative period was somewhat prolonged due to the patients complaint of nausea and epigastric pain.  The liver function tests were stable.  The lipase was slightly increased, which was suspected after extensive abdominal manipulation with surgery.  ______, the patient started to tolerate clear liquids without complications.  Due to the overall deconditioning and generalized weakness with a lack of family or social support, arrangements were made to transfer this patient to the subacute unit on July 23, 2000. The staples were removed on the day of transfer.  Dr. Carolynne Edouard will continue following this patient, along with our service during this acute stay.  #2 - TRANSIENT ACUTE RENAL INSUFFICIENCY SECONDARY TO PRERENAL AZOTEMIA:  The patient had an elevation of the creatinine up to 1.4 due to dehydration and hypotension.  With supportive care and intravenous antibiotic therapy, the patients blood pressure improved, and then her creatinine came back to normal limits.  IV fluids and potassium supplementation also were given to correct the patients dehydration and hypokalemia.  DISCHARGE FOLLOWUP AND DISPOSITION:  [name] is being transferred to the subacute unit in [facility] to receive physical therapy and occupational therapy.  Ongoing surgical and medical care will be provided regarding her hypertension and postoperative wound care.  Upon discharge, Dr. Arvilla Market, [facility], will follow this patient from the general medical standpoint.  CONDITION ON DISCHARGE:  Improved.DD:  07/23/00 TD:  07/24/00 Job: [id number]

## 2010-11-01 NOTE — H&P (Signed)
[place]  Patient:    [name], [name]                        MRN: [id number] Adm. Date:  [id number] Attending:  Jetty Duhamel T CC:         Desma Maxim, M.D.  Chevis Pretty, M.D.  [place] Bowl, M.D.   History and Physical  DATE OF BIRTH:  04/05/1934  PROBLEMS:  1. Generalized weakness/deconditioning.  2. Status post left hemicolectomy, secondary to ischemic bowel.  3. Hypertension.  4. Transient acute renal insufficiency, secondary to hypertension     and dehydration.  5. Hypokalemia.  6. Chronic headaches, ? migraine-type.  7. Smoking three packs per day x more than 50 years:     a. Currently on nicotine patches.  8. Peripheral arteriosclerotic vascular disease:     a. Status post bilateral carotid endarterectomy in November 2000.  9. Recent right forearm fracture on May 11, 2000, (followed by     Dr. [place] Bowl). 10. History of left subdural hematoma requiring drainage in January 2000,     by Dr. Cristi Loron. 11. Postoperative normocytic anemia:     a. Hemoglobin 10.1, MCV 91. 12. Menopause, on hormone replacement therapy. 13. Allergy to PENICILLIN.  CHIEF COMPLAINT:  Generalized weakness.  HISTORY OF PRESENT ILLNESS:  [name] is a very pleasant 77 year old female, being transferred to the subacute unit after going through a left hemicolectomy due to ischemic bowel.  The patient is currently complaining of generalized weakness after spending about three weeks in the hospital.  Due to her poor social support, the patient is being transferred to the subacute unit until she becomes completely independent.  The patient describes mild epigastric discomfort that is significantly improved since two days ago.  She denies nausea, vomiting, diarrhea, or constipation.  Her chronic headache seems to be stable with Tylenol as needed.  No blurred vision, no focal weakness, no swallowing problems, no lower extremity  swelling.  No melena, no tarry stools, no bright red blood per rectum.  No visual changes.  No chest pain, no orthopnea, no PND, no weight loss, no anginal symptoms.  PAST MEDICAL HISTORY:  As in the problem list.  ALLERGIES:  PENICILLIN.  CURRENT MEDICATIONS: 1. Zocor 20 mg p.o. q.d. 2. Enteric-coated aspirin 325 mg p.o. q.d. 3. Premarin 1.25 mg p.o. q.d. 4. Tylenol 500 mg one to two tablets q.6h. p.r.n. 5. Protonix 40 mg p.o. b.i.d. 6. Altace 5 mg p.o. q.d. 7. Clonidine 0.1 mg b.i.d. 8. Imitrex 25 mg p.o. t.i.d. p.r.n. 9. Nicotine patch 14 mg patch q.d.  SOCIAL HISTORY:  The patient is divorced.  She has been living with a man for 19 years.  The patient is retired.  She smokes as described in the problem list.  She denies alcohol use.  The patient has two grown children.  FAMILY HISTORY:  The patients mother died from diabetic ketoacidosis.  Her father died of old age due to an unknown reason.  Her brother died due to an acute myocardial infarction at the age of 75 years old.  The patient denies malignancy, hypertension, or strokes in the family.  REVIEW OF SYSTEMS:  As in the HPI.  PHYSICAL EXAMINATION:  HEENT:  Normocephalic, atraumatic.  Anicteric sclerae.  Conjunctivae within normal limits.  PERRLA.  EOMI.  Funduscopic examination negative for papilledema or hemorrhages.  Tympanic membranes within normal limits.  Moist  mucous membranes. Oropharynx clear.  VITAL SIGNS:  Temperature 100.8 degrees, heart rate 69, respirations 20, blood pressure 132/62.  Oxygen saturation 95% on room air.  NECK:  Supple, no jugular venous distention.  There are some bilateral scars from old endarterectomies.  There are no definitive bruits.  No thyromegaly. No adenopathy.  LUNGS:  Clear to auscultation bilaterally without crackles or wheezes.  Clear air movement bilaterally.  CARDIAC:  A regular rate and rhythm with a 1/6 systolic ejection murmur of the left lower sternal border and  apex.  Questionable S4.  No S3, no rub.  ABDOMEN:  Soft, nontender, nondistended.  Bowel sounds present.  No hepatosplenomegaly.  A mid-abdominal surgical wound looks clean, and without evidence of infection or drainage.  No rebound, no guarding.  No bruits, no masses.  GENITOURINARY:  Within normal limits.  RECTAL:  Not done.  Guaiac-negative stool last evening.  BREASTS:  Within normal limits.  EXTREMITIES:  No cyanosis, clubbing, or edema.  Pulses 1+ bilaterally.  NEUROLOGIC:  Alert and oriented x 3.  Strength 5/5 in all extremities.  Deep tendon reflexes 2-3/5 in all extremities.  Cranial nerves II-XII intact. Sensory intact.  Plantar reflexes downgoing bilaterally.  LABORATORY DATA:  Sodium 140, potassium 3.6, chloride 110, CO2 of 28, BUN 6, creatinine 1.1, glucose 108.  Hemoglobin 10.1, MCV 91, wbcs 5.2, platelets 458.  Guaiac-negative stools.  ASSESSMENT/PLAN: 1. Generalized weakness and deconditioning.  PLAN:  The patient is being transferred to the subacute unit to receive physical and occupational therapy.  Upon discharge from this unit, Dr. Desma Maxim and Dr. Chevis Pretty will be following the patient from the general medicine and surgical standpoints.  2. Status post left hemicolectomy secondary to ischemic bowel.  PLAN:  The patient has a low-grade fever without evidence of an acute infectious process.  Ms. Katayama received intravenous Cipro and Flagyl for at least two weeks during her acute hospitalization at [facility].  The staples are to be removed today.  The abdominal examination is improved.  Dr. Carolynne Edouard will continue following the patient from the surgical standpoint.  3. Hypertension.  PLAN:  The patients blood pressure remains stable.  The goal will be to  discontinue the clonidine tablets throughout the SACU stay, and keep her on Altace, as she was taking prior to this admission.  4. Recent dehydration, prerenal azotemia and  hypokalemia.  PLAN:  Currently there is no evidence of dehydration after IV fluids have been given to the patient in the last two weeks.  A new potassium level will be obtained tomorrow morning.  The last BMET showed a creatinine that is back to a normal range.  5. Right forearm fracture.  PLAN:  Dr. [place] Bowl, orthopedic surgeon is to see the patient on July 28, 2000, at his office.  If the SACU stay were to extend beyond this date, will ask Dr. Criss Alvine to see this patient at [facility], for further input regarding the treatment of the right forearm fracture.  At this point the patient denies any pain or swelling.  She does have a cast on the right forearm that seems to be intact.  6. Chronic migraines.  PLAN:  Stable with Tylenol. DD:  07/23/00 TD:  07/23/00 Job: [id number]

## 2010-11-01 NOTE — Discharge Summary (Signed)
[place]. [facility]  Patient:    [name], [name]                        MRN: [id number] Adm. Date:  [id number] Disc. Date: [id number] Attending:  Jetty Duhamel T                           Discharge Summary  DATE OF BIRTH:  1933/07/28  NO DICTATION. DD:  08/11/00 TD:  08/12/00 Job: [id number]

## 2010-11-01 NOTE — Discharge Summary (Signed)
[name], [name] NO.:  [id number]   MEDICAL RECORD NO.:  [id number]          PATIENT TYPE:  INP   LOCATION:  6713                         FACILITY:  [facility]   PHYSICIAN:  Deirdre Peer. Polite, M.D. DATE OF BIRTH:  January 17, 1934   DATE OF ADMISSION:  11/07/2004  DATE OF DISCHARGE:  11/22/2004                                 DISCHARGE SUMMARY   DISCHARGE DIAGNOSES:  1.  Partial small bowel obstruction, resolved.  2.  Colonic stricture by barium enema with negative stricture or obstruction      by proctocolonoscopy on June 2.  3.  Escherichia coli urinary tract infection, treated.  4.  Mild azotemia secondary to a bowel obstruction, resolved. Creatinine at      discharge 1.3.  5.  Hypertension.  6.  Hypercholesterolemia.  7.  Neuropathy.  8.  History of diverticulitis.  9.  History of migraines.  10. History of restless leg syndrome.  11. History of balance problems.   DISCHARGE MEDICATIONS:  The patient will resume home medications which  includes:  Aspirin 325 mg once daily, metoprolol, Sular, Zetia, Lipitor,  Imipramine at home dosages.   DISPOSITION:  The patient was discharged to home in stable condition. The  patient at this time states that she does not want rehab or home services.  The patient has been seen by PT/OT during this hospitalization and home  health versus SACU has been recommended.   CONSULTATIONS:  Dr. Laural Benes, GI., Dr. Abbey Chatters, M.D. general surgery.   PROCEDURES:  The patient underwent a proctocolonoscopy to 80 cm which showed  normal mucosa, no colitis, neoplasm, or stricture. No colonic obstruction.  Abdominal series on November 19, 2004 showed nonobstructive bowel gas pattern.  Barium enema on May 31:  Narrowing of the descending colon without obvious  mass or evidence of inflammatory process. There may be an ischemic  stricture.  The colon above the narrowing is dilated and stool filled  suggestive of mechanical partial obstruction.  Colonoscopy recommended. CT of  the abdomen and pelvis:  Tiny ventral hernia, small bowel obstruction. Urine  culture:  E. coli. CBC at discharge:  White count 9.6, hemoglobin 10.8, MCV  92, platelets 292. BMET within normal limits with creatinine of 1.3.   HISTORY OF PRESENT ILLNESS:  A 77 year old female presented to the ED with  complaints of abdominal pain. In the ED, the patient was evaluated and had  imaging which suggested small bowel obstruction. Admission was deemed  necessary for further evaluation and treatment. Please see dictated H&P for  further details.   PAST MEDICAL HISTORY:  Per admission H&P includes hypertension,  hypercholesterolemia, balance issues, neuropathy, diverticulitis, migraines,  restless leg syndrome.   PAST SURGICAL HISTORY:  1.  Per admission H&P.  2.  The patient had left kidney removed.  3.  History of hysterectomy.  4.  History of subdural hematoma versus subarachnoid hemorrhage in 2000      requiring cranial surgery.  5.  History of 8 inches of colon removed secondary to bleeding.  6.  History of right carotid endarterectomy.   SOCIAL  HISTORY:  Per admission H&P.   ADMISSION MEDICATIONS:  Per admission H&P.   HOSPITAL COURSE:  Problem 1. The patient was admitted to the [facility] for evaluation and treatment of small bowel obstruction. The  patient had NG tube placed, was started on IV fluids. The patient had other  lab tests which ultimately revealed a urinary tract infection which was  treated appropriately with IV antibiotics. As for the patient's small bowel  obstruction, the patient was given a trial of conservative treatment. She  had her NG tube clamped and had clear liquids instituted. The patient again  complained of abdominal fullness, nausea, and vomiting. Therefore, NG tube  was placed to suction and a surgical consultation was obtained. The patient  had a barium enema which suggested possible strictures and  colonoscopy  recommended. The patient underwent proctocolonoscopy to 80 cm, results as  stated above. Normal mucosa, no colitis, neoplasm, or stricture, no colonic  obstruction. The patient was given another trial of p.o. with NG tube being  clamped. At this time, the patient was able to continue. The patient had a  few set backs; however, after discontinuing all narcotic medications and  increasing mobility, the patient has been completely tolerant of p.o.  without nausea, vomiting, abdominal pain, or emesis. The patient is having  several bowel movements without any difficulty. Again, the patient has been  seen by PT/OT who recommended SACU versus home health. Currently, the  patient is refusing either. At this time, the patient is ready for discharge  to home. Again, home health has been recommended for the patient.   Problem 2. Urinary tract infection with Escherichia coli. The patient has  been treated appropriately. No current outpatient antibiotics indicated.   The patient has several other chronic medical problems as stated above and  can continue current outpatient medications. The patient is asked to follow  up with primary M.D. in approximately 1 week. At that time, the patient will  have followup labs and abdominal x-ray if warranted.       RDP/MEDQ  D:  11/22/2004  T:  11/22/2004  Job:  [id number]   cc:   Donia Guiles, M.D.  [address] [place]  [place] [address]  Fax: [phone]

## 2010-11-01 NOTE — Op Note (Signed)
NAMEENA, DEMARY NO.:  [id number]   MEDICAL RECORD NO.:  [id number]          PATIENT TYPE:  INP   LOCATION:  6713                         FACILITY:  [facility]   PHYSICIAN:  Danise Edge, M.D.   DATE OF BIRTH:  [birth year]   DATE OF PROCEDURE:  11/15/2004  DATE OF DISCHARGE:                                 OPERATIVE REPORT   HISTORY:  Ms. Bobbiejo Ishikawa is a 77 year old female born 10/11/1933. Ms. Cabanilla  was admitted to the hospital Nov 07, 2004 to evaluate and treat acute severe  cramping abdominal pain with nausea and an abdominal CT scan consistent with  small-bowel obstruction. All Nov 13, 2004, her single contrast barium enema  revealed narrowing of the descending colon without masses or an  inflammatory process by CT criteria.   In January 2002, Ms. Gouveia underwent a descending colectomy for ischemic  colitis. She has documented peripheral vascular disease and has required  bilateral carotid endarterectomies in the past. She has a 150-pack year  history of cigarette smoking.   Flexible proctosigmoidoscopy is requested to evaluate the left colon. Ms.  Muela is on subcutaneous heparin.   MEDICATION ALLERGIES:  MACROLIDE, PENICILLIN, CODEINE.   CURRENT MEDICATIONS:  Subcutaneous heparin, Claritin, Nicoderm patch,  Tofranil, Ativan, Protonix, Dilaudid, Tylenol, labetalol, Zofran.   REVIEW OF SYSTEMS:  Ms. Stander has chronic constipation.   PAST MEDICAL HISTORY:  1.  In January 2002, descending colectomy to treat ischemic colitis.  2.  A 150 pack-year cigarette smoker.  3.  Hypertension.  4.  Bilateral carotid endarterectomies.  5.  Bilateral cataract surgery.  6.  Right forearm fracture.  7.  Left subdural hematoma drainage in 2000.  8.  Migraine headaches.  9.  Left nephrectomy.  10. Hysterectomy.  11. Neuropathy.  12. Restless leg syndrome.  13. Hypercholesterolemia.   HABITS:  Ms. Reinertsen does not consume alcohol. She does have a 150 pack-year  history of  cigarette smoking.   FAMILY HISTORY:  Noncontributory.   LABORATORY DATA:  CBC normal. Complete metabolic profile normal except for a  mildly elevated serum creatinine. Serum lipase normal. Cardiac enzymes  normal. Urinalysis reveals pyuria associated with an E. coli urinary tract  infection.   ENDOSCOPIST:  Danise Edge, M.D.   PREMEDICATION:  Fentanyl 50 mcg, Versed 5 milligrams.   PROCEDURE IN DETAIL:  After obtaining informed consent, Ms. Knabe was placed  in the left lateral decubitus position. I administered intravenous fentanyl  and intravenous Versed to achieve conscious sedation for the procedure. The  patient's blood pressure, oxygen saturation, and cardiac rhythm were  monitored throughout the procedure and documented in the medical record.   Anal inspection and digital rectal exam were normal. The Foley catheter is  in place. A nasogastric tube is in place. The Olympus adjustable pediatric  colonoscope was introduced into the rectum and advanced to approximately 80  cm from the anal verge. There are scattered barium stool balls throughout  the colon.   Endoscopic appearance of the rectal mucosa and left colon mucosa with the  endoscope advanced to 80  cm from the anal verge was completely normal. There  are no signs of mucosal colitis, mucosal neoplasia, or colonic stricture  formation. I see no signs of colonic obstruction.   RECOMMENDATIONS:  1.  Clamp nasogastric tube and begin clear liquid diet. Advance diet as      tolerated.  2.  Start MiraLax 17 grams powder in 8 ounces of water daily to treat      chronic constipation.  3.  If the patient tolerates clear liquid diet, discontinue nasogastric      tube.      MJ/MEDQ  D:  11/15/2004  T:  11/15/2004  Job:  [id number]

## 2010-11-08 ENCOUNTER — Other Ambulatory Visit: Payer: Self-pay

## 2010-11-08 ENCOUNTER — Ambulatory Visit: Payer: Self-pay | Admitting: Vascular Surgery

## 2010-11-22 ENCOUNTER — Ambulatory Visit: Payer: Self-pay | Admitting: Vascular Surgery

## 2010-11-22 ENCOUNTER — Other Ambulatory Visit: Payer: Self-pay

## 2010-12-13 ENCOUNTER — Other Ambulatory Visit: Payer: Self-pay

## 2010-12-13 ENCOUNTER — Ambulatory Visit: Payer: Self-pay | Admitting: Vascular Surgery

## 2011-01-10 ENCOUNTER — Other Ambulatory Visit: Payer: Self-pay

## 2011-01-10 ENCOUNTER — Ambulatory Visit: Payer: Self-pay | Admitting: Vascular Surgery

## 2011-01-15 ENCOUNTER — Emergency Department (HOSPITAL_COMMUNITY): Payer: Medicare Other

## 2011-01-15 ENCOUNTER — Inpatient Hospital Stay (HOSPITAL_COMMUNITY)
Admission: EM | Admit: 2011-01-15 | Discharge: 2011-01-20 | DRG: 065 | Disposition: A | Discharge status: Skilled Nursing Facility | Payer: Medicare Other | Attending: Internal Medicine | Admitting: Internal Medicine

## 2011-01-15 ENCOUNTER — Other Ambulatory Visit (HOSPITAL_COMMUNITY): Payer: Medicare Other

## 2011-01-15 DIAGNOSIS — M629 Disorder of muscle, unspecified: Secondary | ICD-10-CM

## 2011-01-15 DIAGNOSIS — I672 Cerebral atherosclerosis: Secondary | ICD-10-CM | POA: Diagnosis present

## 2011-01-15 DIAGNOSIS — Z993 Dependence on wheelchair: Secondary | ICD-10-CM

## 2011-01-15 DIAGNOSIS — Z66 Do not resuscitate: Secondary | ICD-10-CM | POA: Diagnosis present

## 2011-01-15 DIAGNOSIS — E785 Hyperlipidemia, unspecified: Secondary | ICD-10-CM | POA: Diagnosis present

## 2011-01-15 DIAGNOSIS — B961 Klebsiella pneumoniae [K. pneumoniae] as the cause of diseases classified elsewhere: Secondary | ICD-10-CM | POA: Diagnosis present

## 2011-01-15 DIAGNOSIS — R197 Diarrhea, unspecified: Secondary | ICD-10-CM | POA: Diagnosis present

## 2011-01-15 DIAGNOSIS — I658 Occlusion and stenosis of other precerebral arteries: Secondary | ICD-10-CM | POA: Diagnosis present

## 2011-01-15 DIAGNOSIS — N182 Chronic kidney disease, stage 2 (mild): Secondary | ICD-10-CM | POA: Diagnosis present

## 2011-01-15 DIAGNOSIS — M25579 Pain in unspecified ankle and joints of unspecified foot: Secondary | ICD-10-CM | POA: Diagnosis present

## 2011-01-15 DIAGNOSIS — I70209 Unspecified atherosclerosis of native arteries of extremities, unspecified extremity: Secondary | ICD-10-CM | POA: Diagnosis present

## 2011-01-15 DIAGNOSIS — I714 Abdominal aortic aneurysm, without rupture, unspecified: Secondary | ICD-10-CM | POA: Diagnosis present

## 2011-01-15 DIAGNOSIS — Z9181 History of falling: Secondary | ICD-10-CM

## 2011-01-15 DIAGNOSIS — I129 Hypertensive chronic kidney disease with stage 1 through stage 4 chronic kidney disease, or unspecified chronic kidney disease: Secondary | ICD-10-CM | POA: Diagnosis present

## 2011-01-15 DIAGNOSIS — I6350 Cerebral infarction due to unspecified occlusion or stenosis of unspecified cerebral artery: Principal | ICD-10-CM | POA: Diagnosis present

## 2011-01-15 DIAGNOSIS — I7092 Chronic total occlusion of artery of the extremities: Secondary | ICD-10-CM | POA: Diagnosis present

## 2011-01-15 DIAGNOSIS — I6529 Occlusion and stenosis of unspecified carotid artery: Secondary | ICD-10-CM | POA: Diagnosis present

## 2011-01-15 DIAGNOSIS — W06XXXA Fall from bed, initial encounter: Secondary | ICD-10-CM | POA: Diagnosis present

## 2011-01-15 DIAGNOSIS — F17200 Nicotine dependence, unspecified, uncomplicated: Secondary | ICD-10-CM | POA: Diagnosis present

## 2011-01-15 DIAGNOSIS — Y998 Other external cause status: Secondary | ICD-10-CM

## 2011-01-15 DIAGNOSIS — Z9119 Patient's noncompliance with other medical treatment and regimen: Secondary | ICD-10-CM

## 2011-01-15 DIAGNOSIS — Z91199 Patient's noncompliance with other medical treatment and regimen due to unspecified reason: Secondary | ICD-10-CM

## 2011-01-15 DIAGNOSIS — N390 Urinary tract infection, site not specified: Secondary | ICD-10-CM | POA: Diagnosis present

## 2011-01-15 LAB — CBC
MCH: 29 pg (ref 26.0–34.0)
MCHC: 32.5 g/dL (ref 30.0–36.0)
MCV: 89.4 fL (ref 78.0–100.0)
Platelets: 215 10*3/uL (ref 150–400)
RDW: 14.7 % (ref 11.5–15.5)

## 2011-01-15 LAB — URINALYSIS, ROUTINE W REFLEX MICROSCOPIC
Glucose, UA: NEGATIVE mg/dL
Ketones, ur: NEGATIVE mg/dL
Nitrite: POSITIVE — AB
Protein, ur: NEGATIVE mg/dL

## 2011-01-15 LAB — BASIC METABOLIC PANEL
Calcium: 10.1 mg/dL (ref 8.4–10.5)
Creatinine, Ser: 1.94 mg/dL — ABNORMAL HIGH (ref 0.50–1.10)
GFR calc Af Amer: 30 mL/min — ABNORMAL LOW (ref >60)
GFR calc non Af Amer: 25 mL/min — ABNORMAL LOW (ref >60)

## 2011-01-15 LAB — DIFFERENTIAL
Eosinophils Absolute: 0.1 10*3/uL (ref 0.0–0.7)
Eosinophils Relative: 0 % (ref 0–5)
Lymphs Abs: 3.7 10*3/uL (ref 0.7–4.0)
Monocytes Relative: 6 % (ref 3–12)

## 2011-01-15 LAB — URINE MICROSCOPIC-ADD ON

## 2011-01-15 NOTE — Progress Notes (Deleted)
[facility] [place]  Established Carotid Patient  History of Present Illness  [name] is a 77 y.o. female who presents with chief complaint: follow up for AAA and B CEA.  Previous carotid studies demonstrated: RICA ***% stenosis, LICA ***% stenosis.  Patient has *** history of TIA or stroke symptom.  The patient has *** had amaurosis fugax or monocular blindness.  The patient has *** had facial drooping or hemiplegia.  The patient has *** had receptive or expressive aphasia.    Additionally, patient has *** abdominal or back pain.  Her previous risks for AAA expansion included smoking and hypertension.  She continues to smoke *** packs/day.  Past Medical History, Past Surgical History, Social History, Family History, Medications, Allergies, and Review of Systems are unchanged from previous evaluation on 05/03/10.  Physical Examination  There were no vitals filed for this visit.  General: A&O x 3, WDWN***, Cachectic / Ill appear / Somulent  Eyes: PERRLA, EOMI***, Post surg chg to pupils / Unable to coop w/ exam  Pulmonary: Sym exp, good air movt, CTAB, no rales, rhonchi, & wheezing***, + rales / + rhonchi / + wheezing  Cardiac: RRR, Nl S1, S2, no Murmurs, rubs or gallops***, S3/S4, Irregularly, irregular rhythm and rate  Vascular: Vessel Right Left  Radial Palpable Palpable  Ulnary Palpable Palpable  Brachial Palpable Palpable  Carotid Palpable, without bruit Palpable, without bruit  Aorta Non-palpable N/A  Femoral Palpable Palpable  Popliteal Non-palpable Non-palpable  PT Palpable Palpable  DP Palpable Palpable   Gastrointestinal: soft, NTND, -G/R, - HSM, - masses, - CVAT B***, + AAA / Surg. Inc TTP: (RUQ / RLQ / LUQ / LLQ / Epigastric), +G / +R  Musculoskeletal: M/S 5/5 throughout *** except ***, Extremities without ischemic changes *** except  ***  Neurologic: CN 2-12 intact *** except ***, Pain and light touch intact in extremities *** except  ***, Motor exam as listed above  Non-Invasive Vascular Imaging  AAA DUPLEX (Date: ***):   Aorta: ***  CAROTID DUPLEX (Date: ***):   R ICA stenosis: ***%  R VA: *** patent and antegrade  L ICA stenosis: ***%  L VA: *** patent and antegrade  Medical Decision Making  [name] is a 77 y.o. female who presents with: s/p B CEA and AAA.   Based on the patient's vascular studies and examination, I have offered the patient: ***. She will need follow up surveillance studies in *** months.  I discussed in depth with the patient the nature of atherosclerosis, and emphasized the importance of maximal medical management including strict control of blood pressure, blood glucose, and lipid levels, obtaining regular exercise, and cessation of smoking.  The patient is aware that without maximal medical management the underlying atherosclerotic disease process will progress, limiting the benefit of any interventions.  Thank you for allowing [place] to participate in this patient's care.  Leonides Sake, MD [facility] [place] Office: [phone] Pager: [phone]

## 2011-01-16 ENCOUNTER — Inpatient Hospital Stay (HOSPITAL_COMMUNITY): Payer: Medicare Other

## 2011-01-16 DIAGNOSIS — I6529 Occlusion and stenosis of unspecified carotid artery: Secondary | ICD-10-CM

## 2011-01-16 DIAGNOSIS — I739 Peripheral vascular disease, unspecified: Secondary | ICD-10-CM

## 2011-01-16 LAB — COMPREHENSIVE METABOLIC PANEL
ALT: 10 U/L (ref 0–35)
ALT: 9 U/L (ref 0–35)
AST: 16 U/L (ref 0–37)
AST: 17 U/L (ref 0–37)
Albumin: 2.8 g/dL — ABNORMAL LOW (ref 3.5–5.2)
Alkaline Phosphatase: 84 U/L (ref 39–117)
CO2: 26 mEq/L (ref 19–32)
Calcium: 9.3 mg/dL (ref 8.4–10.5)
Chloride: 102 mEq/L (ref 96–112)
GFR calc non Af Amer: 27 mL/min — ABNORMAL LOW (ref >60)
Potassium: 4.1 mEq/L (ref 3.5–5.1)
Sodium: 137 mEq/L (ref 135–145)
Sodium: 138 mEq/L (ref 135–145)
Total Bilirubin: 0.3 mg/dL (ref 0.3–1.2)
Total Protein: 6.5 g/dL (ref 6.0–8.3)

## 2011-01-16 LAB — PROTIME-INR: INR: 1.03 (ref 0.00–1.49)

## 2011-01-16 LAB — LIPID PANEL
HDL: 38 mg/dL — ABNORMAL LOW (ref >39)
LDL Cholesterol: 106 mg/dL — ABNORMAL HIGH (ref 0–99)
Total CHOL/HDL Ratio: 5.2 RATIO

## 2011-01-16 LAB — CARDIAC PANEL(CRET KIN+CKTOT+MB+TROPI)
Relative Index: 2.4 (ref 0.0–2.5)
Troponin I: <0.3 ng/mL (ref <0.30)

## 2011-01-17 ENCOUNTER — Other Ambulatory Visit: Payer: Self-pay

## 2011-01-17 ENCOUNTER — Inpatient Hospital Stay (HOSPITAL_COMMUNITY): Payer: Medicare Other

## 2011-01-17 ENCOUNTER — Encounter: Payer: Self-pay | Admitting: Vascular Surgery

## 2011-01-17 DIAGNOSIS — M629 Disorder of muscle, unspecified: Secondary | ICD-10-CM

## 2011-01-17 LAB — URINE CULTURE

## 2011-01-17 NOTE — H&P (Signed)
[name], [name] NO.:  [id number]  MEDICAL RECORD NO.:  [id number]  LOCATION:  3035                         FACILITY:  [facility]  PHYSICIAN:  Rockey Situ. Flavia Shipper., M.D.DATE OF BIRTH:  [birth year]  DATE OF ADMISSION:  01/15/2011 DATE OF DISCHARGE:                             HISTORY & PHYSICAL   This is one of multiple [facility] admissions for this 77 year old woman admitted for an acute on chronic cerebrovascular accident involving the left frontal area.  History was available from the patient.  I was also able to review available medical records, to review recent labs and to see chest x-rays and CT MRI with radiology assistance.  PamelaJuarez is a 77 year old woman with: 1. Severe diffuse atherosclerotic disease from chronic tobacco use (1     to 1-1/2 packs per day for 60 years or more), hypertension,     hyperlipidemia, leading to acute on chronic left frontal CVA.    She has remote bilateral carotid endarterectomies, with  both     intracranial and extracranial carotid disease.  She has coronary     artery disease as evidenced on CT scan showing a highly calcified     left anterior descending artery. She has an abdominal aortic     aneurysm, and she has an ischemic left foot that appears to be     chronic. 2. She has a remote nephrectomy for congenital anomaly leading to     chronic renal insufficiency.  Her creatinine today is 1.94,     although she has had creatinines higher in the recent past. 3. She has chronic diarrhea post colonic hypomotility from narcotics.     I checked her for impaction and did not feel like there was one.     This diarrhea may be secondary to ischemic bowel disease. 4. Multiple fractures in the past secondary to falls with hips and     femur fractures.  She has had a left total hip arthroplasty and     repeated femoral fractures even subsequent to that. 5. She is allergic to PENICILLIN and CODEINE, which causes  hives. 6. Asymptomatic urinary tract infection.  The plan is to admit her to the hospital.  I have discussed her end-of- life desires and she wishes no cardiac resuscitation or mechanical ventilation in the event that her heart should stop or her breathing should stop.  She will continue on aspirin therapy.  She will get a vascular surgical consult, which I have called to the doctor on-call and we will consult stroke team in addition.  [name] was in her usual chronic ill health until this morning.  She is wheelchair bound and transfers from bed to wheelchair with some difficulty.  She has fallen in the process in the past with fractures. At 6:30 this morning she was transferring from her bed to her wheelchair and she fell.  She laid on the floor until at least 8 a.m. when a repair man came by.  He called the EMTs who put her back in bed. She refused to come to the emergency room at that time.  Then subsequently because of her continued weakness  and difficulty transferring, she decided to come to the emergency room later this evening.  CT scan evidenced a hypodensity in the left frontal region and MRI indicates that there is some acute aspect to it within the last several weeks.  Because the history would indicate that the stroke ocurred at a minimum of early this morning, she is out of the period of acute management.  Regardless with her comorbid conditions, I would be very reluctant to give her any TPA or full anticoagulation.  She is known to have severe atherosclerotic disease.  She has been a heavy smoker from age 61 with 1 to 1-1/2 packs per day of unfiltered cigarettes.  She is known hypertensive up until recently when chronic disease led to hypotension.  She has chronic renal insufficiency and hyperlipidemia.  HOSPITALIZATIONS:  Multiple hospitalizations in recent past for her atherosclerosis and repeated bone fractures.  She had one admission for colonic atony because of  narcotic use.  SURGERIES:  She has had a renal resection for congenital anomaly.  She has had a tubal ligation.  She had a subdural in January 2000 that was evacuated.  She has cracked her pelvis, broken her femur, had hip fractures leading to total hip arthroplasty, and then a femoral repair.  She has had both carotid arteries repaired by Dr. Durwin Nora.  She furthermore has a known abdominal aortic aneurysm under surveillance.    She has had no CVA, MI, rheumatic fever, evidence of tuberculosis, or diabetes mellitus.  She does have hypertension in the past.  Now her blood pressures have been running low.  MEDICATION LIST: 1. Zyrtec 10 mg in the morning. 2. Fish oil 1200 mg in the morning. 3. Vitamin D 2000 units in the morning. 4. Lyrica 75 mg in the morning.  In the evening: 1. Zyrtec 10 mg   At bedtime:  1.Lyrica 75 mg  2. imipramine 25 mg both for her neuropathies and chronic pain.  PERSONAL HISTORY:  She smokes one pack every 3-4 days usually, in the past it was 1 to 1-1/2 packs per day.  She smoked since 77 years of age. ETOH: she has never had trouble and does not drink.  No IV drug use. She has had 3 lifetime sexual partners, all female.  She has been married twice.  Her third partner she lived with for 27 years until he died of a massive MI in 2007-11-06.  She has had 6 children, 2 lived into adulthood and  she has 3 grandchildren.  PHYSICAL EXAMINATION:  VITAL SIGNS:  Per the emergency room record. HEENT:  Normocephalic without trauma.  Hair and scalp within normal limits.  Eyes, lids within normal limits.  CNS clear.  EOMs intact.  She has got arcus senilis and cataracts bilaterally.  Funduscopic exam was attempted, but not successful.  Ears, pinna, EAC, TMs within normal limits.  Nose: septum and mucosa within normal limits.  Mouth edentulous with tongue normal and mucosa normal. NECK:  She has got bilateral carotid endarterectomy scars and bruits bilaterally.  No  lymphadenopathy or thyromegaly.   As she could not sit up, examination of the thorax was anterior.   CARDIAC:  She has got normal precordium without hyperactivity.  PMI is normal.  S1 and S2 were normal.  No S3 or S4.  No murmurs, gallops, arrhythmias, or rubs. ABDOMEN:  Obese, but soft.  No abdominal bruits evident and no abdominal aneurysm palpable. LUNGS:  Clear to auscultation anteriorly.  She wears a diaper. RECTAL:  Indicated no hard or impacted stool. EXTREMITIES:  Her legs are without peripheral pulses.  Her right foot is warm.  Her left foot is cool to the touch with evidence of muscle atrophy.  There is also some ischemic skin changes on the left.  This appears to be chronic.  LABORATORY REVIEW:  Indicates an elevated white count mildly.  Her creatinine as noted was elevated, but this appears to be chronic in nature.  She has got evidence of urinary tract infection, but she has had no symptoms of dysuria, polyuria, or discomfort.  (Asymptomatic urinary tract infections in the elderly should not be treated.  I therefore stopped the Rocephin.  Treatment of urinary tract infections that are asymptomatic in the elderly lead to side effects of the medication and/or antibiotic resistance.)  In summary, this is an elderly woman with severe atherosclerosis in multiple regions. Although she has probably suffered a recent minimal infarction.  Her comorbid conditions and atherosclerosis elsewhere are equally severe and probably irreparable.  I therefore support her decision for "do not resuscitate". We will, however, continue to support antiplatelet therapy with aspirin.  I have continued her current medication.  Her chronic renal insufficiency should resolve with time and oral hydration.  Full anticoagulation is not indicated and probably would be life threatening given her circumstances.  I have spoken with the CVTS surgeon on-call and I have asked that they attend her in the  morning.  My colleagues will involve the stroke team tomorrow.     Rockey Situ. Flavia Shipper., M.D.     ENR/MEDQ  D:  01/15/2011  T:  01/16/2011  Job:  [id number]  Electronically Signed by Lenn Sink M.D. on 01/17/2011 03:07:30 PM

## 2011-01-18 LAB — CBC
HCT: 43.9 % (ref 36.0–46.0)
Hemoglobin: 14.1 g/dL (ref 12.0–15.0)
MCH: 29.3 pg (ref 26.0–34.0)
MCV: 91.1 fL (ref 78.0–100.0)
Platelets: 237 10*3/uL (ref 150–400)
RBC: 4.82 MIL/uL (ref 3.87–5.11)
WBC: 7.2 10*3/uL (ref 4.0–10.5)

## 2011-01-18 LAB — BASIC METABOLIC PANEL
CO2: 30 mEq/L (ref 19–32)
Chloride: 102 mEq/L (ref 96–112)
Creatinine, Ser: 1.2 mg/dL — ABNORMAL HIGH (ref 0.50–1.10)
Glucose, Bld: 94 mg/dL (ref 70–99)

## 2011-01-20 LAB — BASIC METABOLIC PANEL
BUN: 24 mg/dL — ABNORMAL HIGH (ref 6–23)
CO2: 28 mEq/L (ref 19–32)
Calcium: 9.4 mg/dL (ref 8.4–10.5)
Chloride: 105 mEq/L (ref 96–112)
Creatinine, Ser: 1.26 mg/dL — ABNORMAL HIGH (ref 0.50–1.10)
GFR calc Af Amer: 50 mL/min — ABNORMAL LOW (ref >60)

## 2011-01-20 LAB — CBC
HCT: 41.1 % (ref 36.0–46.0)
MCH: 29.4 pg (ref 26.0–34.0)
MCV: 91.5 fL (ref 78.0–100.0)
RDW: 14.2 % (ref 11.5–15.5)
WBC: 8.1 10*3/uL (ref 4.0–10.5)

## 2011-01-20 NOTE — Progress Notes (Signed)
This encounter was created in error - please disregard.

## 2011-01-24 ENCOUNTER — Other Ambulatory Visit: Payer: Self-pay

## 2011-01-24 ENCOUNTER — Ambulatory Visit: Payer: Self-pay | Admitting: Vascular Surgery

## 2011-01-27 NOTE — Consult Note (Signed)
NAMEBERKLEIGH, [name]:  [id number]  MEDICAL RECORD NO.:  [id number]  LOCATION:  3035                         FACILITY:  [facility]  PHYSICIAN:  Thana Farr, MD    DATE OF BIRTH:  Oct 25, 1933  DATE OF CONSULTATION:  01/16/2011 DATE OF DISCHARGE:                                CONSULTATION   REASON FOR CONSULTATION:  Bilateral parietal strokes.  HISTORY OF PRESENT ILLNESS:  This is a pleasant 77 year old Caucasian female who has significant medical history of chronic diseases including hypertension, hypercholesterolemia, CAD, chronic renal failure, carotid artery stenosis status post bilateral CEAs, CVA, abdominal aortic aneurysm currently awaiting ultrasound for clarification, and chronic left foot ischemia.  The patient is wheelchair-bound at home secondary to left foot ischemia and spends the majority of the time alone.  She does live with her son, but she is usually off on dizziness.  At home, her medications include imipramine, Lyrica, Zetia, and Zyrtec.  The patient woke on the date of January 14, 2011, and noted that she felt slightly weaker on her left side.  She tried to transfer to her wheelchair in the morning and fell.  The patient called EMS and she was brought to [facility].  While at the hospital, the patient had a CT of her brain which was read out hypodensities in the cortical and subcortical left frontal lobe, worrisome for acute/subacute infarct that was followed up by an MRI of the brain which read remote anterior left frontal lobe infarct with encephalomalacia and laminar necrosis. Posterosuperior to this level, there is a small acute mid to posterior left frontal lobe nonhemorrhagic infarct.  Additionally, a tiny acute infarct in the right motor strip.  The patient's MRA of the brain showed left internal carotid artery periophthalmic 2.8-mm aneurysm along with intracranial atherosclerotic changes.  The patient was admitted to  [facility] and placed on aspirin and Zocor.  Neurology was asked to see the patient for further evaluation.  In addition, the patient was also found to have what seemed to be a left ischemic foot to which Vascular Surgery was consulted.  The patient was also followed outpatient by Dr. Leonides Sake for her 4.3-cm abdominal aortic aneurysm.  PAST MEDICAL HISTORY:  As noted above.  MEDICATIONS:  At home, she is on imipramine, Lyrica, Zetia, and Zyrtec. While in the hospital, she has been placed on aspirin, Rocephin, Tofranil, Claritin, Lyrica, Zocor, and Zofran p.r.n.  ALLERGIES: 1. PENICILLIN. 2. CODEINE. 3. MACROLIDES.  SOCIAL HISTORY:  The patient smokes 6-8 cigarettes per day per the patient.  Does not drink and does not do illicit drugs and lives with her son who has gone for the majority of the time for his work.  REVIEW OF SYSTEMS:  Positive for left foot pain and left-sided weakness, otherwise negative with the exception above.  PHYSICAL EXAMINATION:  VITAL SIGNS:  Blood pressure is 127/76, pulse 84, respirations 14, and temperature 97.9. NEUROLOGIC:  The patient is alert and oriented x3.  She carries out 2 and 3-step commands without difficulty.  Pupils are equal, round, and reactive to light.  Conjugate gaze.  Extraocular movements are intact. Visual fields are  grossly intact.  Face symmetrical.  Tongue is midline. Uvula is midline.  Facial sensation is full to pinprick.  Shoulder shrug and head turn is within normal limits.  Coordination:  Finger-to-nose is smooth.  Heel-to-shin is smooth.  Fine motor movements within normal limits.  Gait was not tested at this time.  The patient is wheelchair- bound at home.  Motor:  The patient shows right upper and lower extremity of 4+/5, a left upper extremity of 4-/5 with a drift. Bilateral lower extremities were 5/5.  The patient's deep tendon reflexes were 2+ in the upper and upper and lower extremities with the exception  of her ankles which are negligible.  She has what appeared to be an upgoing toe on the left, however, her left foot is extremely painful and it was difficult to elicit this response.  Drift as stated above.  She does have a left upper extremity drift. PULMONARY:  Clear to auscultation. CARDIOVASCULAR:  S1 and S2. NECK:  Negative for bruits.  Sensation is intact to pinprick and light touch throughout.  She is hypostatic on her left foot secondary to neuropathy and secondary to peripheral vascular disease.  LABORATORY DATA:  HbA1c is 6.0.  UA showed positive for nitrites and leukocytes.  Sodium 138, potassium 4.1, chloride 102, CO2 is 26, BUN 40, creatinine 1.67, and glucose is 96.  Her white blood cell count is 13.3, hemoglobin 13.9, hematocrit 42.8, and platelets are 215.  Triglycerides 265, cholesterol is 197, HDL 38, and LDL 106.  MRI of brain, MRA of brain, and CT of brain as mentioned above.  ASSESSMENT:  This is a pleasant 77 year old female with new acute left frontal lobe infarct and small infarct in the right motor strip.  The patient is presenting with left upper extremity weakness.  At present time, the patient has been placed on aspirin and Zocor.  Physical Therapy has seen and evaluated the patient.  We are awaiting carotid Dopplers and 2-D echo to further evaluate the stroke workup.  Our RECOMMENDATIONS would be to: 1. Continue aspirin 81 mg daily. 2. Continue Zocor. 3. Physical therapy and occupational therapy. 4. Stressed smoking cessation.  Stroke Team will follow in the morning and follow carotid Dopplers and 2- D echo.  Dr. Thad Ranger has been seen and evaluated the patient and agrees with the above mentioned.     [name] Morn, PA-C   ______________________________ Thana Farr, MD    DS/MEDQ  D:  01/16/2011  T:  01/16/2011  Job:  [id number]  Electronically Signed by [name] Morn PA-C on 01/20/2011 12:23:13 PM Electronically Signed by Thana Farr MD  on 01/27/2011 12:41:14 AM

## 2011-02-13 NOTE — Consult Note (Signed)
NAMEMAURISHA, [name] NO.:  [id number]  MEDICAL RECORD NO.:  [id number]  LOCATION:  3035                         FACILITY:  [facility]  PHYSICIAN:  Quita Skye. Hart Rochester, M.D.  DATE OF BIRTH:  Dec 22, 1933  DATE OF CONSULTATION: DATE OF DISCHARGE:                                CONSULTATION   REASON FOR CONSULTATION:  Rule out ischemia, left foot.  REFERRED BY:  Burnice Logan, MD  HISTORY OF PRESENT ILLNESS:  This 77 year old female patient well known to the [facility] practice, was admitted through the emergency department last night with a possible left frontal CVA.  She fell at home and was came to the emergency department where a CT and MRI were scheduled, results of which are pending.  It was felt that the patient had a left frontal CVA of unknown chronicity.  The patient was also noted to have some chronic ischemic changes in the left foot.  She denies any recent infection, ,ulceration gangrene or other severe problems in the left foot.  She does have decreased sensation in both feet due to neuropathy. Both feet are painful at times, left worse than right.  She has been nonambulatory for 3 years since fractured femur and lives alone at home.  PAST MEDICAL HISTORY:  Chronic medical problems: 1. Hypertension. 2. Hyperlipidemia. 3. Abdominal aortic aneurysm followed by Dr. Leonides Sake in our office,     due to see him tomorrow, 4.3 cm most recently. 4. Peripheral neuropathy. 5. Osteoarthritis. 6. Negative for coronary artery disease and diabetes.  SOCIAL HISTORY:  The patient smokes 1 to 1-1/2 packs of cigarettes per day for 60 years.  Does not use alcohol.  Lives at home alone.  FAMILY HISTORY:  Father had an abdominal aortic aneurysm.  Mother had diabetes, hypertension, hyperlipidemia and obesity.  Other brother with diabetes.  REVIEW OF SYSTEMS:  Denies any chest pain, dyspnea on exertion, PND, orthopnea.  Does have decreased sensation in feet with chronic  weakness. She states on her left side unknown reason.  All other systems are negative.  PHYSICAL EXAMINATION:  VITAL SIGNS:  Blood pressure is 140/70, heart rate 70, respirations 14. GENERAL:  She is chronically ill-appearing female in no apparent distress, alert and oriented x3. HEENT:  Normal for age.  EOMs intact.  Carotid pulses are 3+ on the right, 3+ on the left. CHEST:  Clear to auscultation.  No rhonchi or wheezing. CARDIOVASCULAR:  Regular rhythm.  No murmurs. ABDOMEN:  Soft with a small pulsatile mass approximating 4.3 cm. EXTREMITIES:  Lower extremity exam reveals 3+ femoral pulses bilaterally with absent popliteal and distal pulses.  There is a small dark area on the anterior aspect of the left fourth toe.  There is no gangrene, ulceration, or infection.  She does have decreased sensation in both feet.  Both feet are adequately perfused.  IMPRESSION: 1. Chronic peripheral vascular disease, probable bilateral superficial     femoral occlusions.  No evidence of limb-threatening ischemia. 2. Abdominal aortic aneurysm, due to see Dr. Imogene Burn tomorrow. 3. History of previous bilateral carotid endarterectomies by Dr.     Edilia Bo, has not been seen in a few years for this.  PLAN: 1. We will check lower extremity arterial Dopplers. 2. Carotid duplex exam. 3. Duplex scan of the abdominal aorta in our office to be followed by     Dr. Imogene Burn in a few weeks.  Do not anticipate any treatment for lower     extremity occlusive disease.     Quita Skye Hart Rochester, M.D.     JDL/MEDQ  D:  01/16/2011  T:  01/16/2011  Job:  [id number]  Electronically Signed by Josephina Gip M.D. on 02/13/2011 09:20:23 AM

## 2011-02-25 ENCOUNTER — Emergency Department (HOSPITAL_COMMUNITY): Payer: Medicare Other

## 2011-02-25 ENCOUNTER — Inpatient Hospital Stay (HOSPITAL_COMMUNITY)
Admission: EM | Admit: 2011-02-25 | Discharge: 2011-02-28 | DRG: 191 | Disposition: A | Discharge status: Home Health | Payer: Medicare Other | Attending: Internal Medicine | Admitting: Internal Medicine

## 2011-02-25 DIAGNOSIS — Z8673 Personal history of transient ischemic attack (TIA), and cerebral infarction without residual deficits: Secondary | ICD-10-CM

## 2011-02-25 DIAGNOSIS — M6282 Rhabdomyolysis: Secondary | ICD-10-CM | POA: Diagnosis present

## 2011-02-25 DIAGNOSIS — R6510 Systemic inflammatory response syndrome (SIRS) of non-infectious origin without acute organ dysfunction: Secondary | ICD-10-CM | POA: Diagnosis present

## 2011-02-25 DIAGNOSIS — G589 Mononeuropathy, unspecified: Secondary | ICD-10-CM | POA: Diagnosis present

## 2011-02-25 DIAGNOSIS — J309 Allergic rhinitis, unspecified: Secondary | ICD-10-CM | POA: Diagnosis present

## 2011-02-25 DIAGNOSIS — I714 Abdominal aortic aneurysm, without rupture, unspecified: Secondary | ICD-10-CM | POA: Diagnosis present

## 2011-02-25 DIAGNOSIS — Z7982 Long term (current) use of aspirin: Secondary | ICD-10-CM

## 2011-02-25 DIAGNOSIS — N182 Chronic kidney disease, stage 2 (mild): Secondary | ICD-10-CM | POA: Diagnosis present

## 2011-02-25 DIAGNOSIS — K5989 Other specified functional intestinal disorders: Secondary | ICD-10-CM | POA: Diagnosis present

## 2011-02-25 DIAGNOSIS — N179 Acute kidney failure, unspecified: Secondary | ICD-10-CM | POA: Diagnosis present

## 2011-02-25 DIAGNOSIS — F17200 Nicotine dependence, unspecified, uncomplicated: Secondary | ICD-10-CM | POA: Diagnosis present

## 2011-02-25 DIAGNOSIS — Z8781 Personal history of (healed) traumatic fracture: Secondary | ICD-10-CM

## 2011-02-25 DIAGNOSIS — E785 Hyperlipidemia, unspecified: Secondary | ICD-10-CM | POA: Diagnosis present

## 2011-02-25 DIAGNOSIS — R82998 Other abnormal findings in urine: Secondary | ICD-10-CM | POA: Diagnosis present

## 2011-02-25 DIAGNOSIS — R197 Diarrhea, unspecified: Secondary | ICD-10-CM | POA: Diagnosis present

## 2011-02-25 DIAGNOSIS — Z7902 Long term (current) use of antithrombotics/antiplatelets: Secondary | ICD-10-CM

## 2011-02-25 DIAGNOSIS — I129 Hypertensive chronic kidney disease with stage 1 through stage 4 chronic kidney disease, or unspecified chronic kidney disease: Secondary | ICD-10-CM | POA: Diagnosis present

## 2011-02-25 DIAGNOSIS — Z9181 History of falling: Secondary | ICD-10-CM

## 2011-02-25 DIAGNOSIS — J479 Bronchiectasis, uncomplicated: Principal | ICD-10-CM | POA: Diagnosis present

## 2011-02-25 LAB — DIFFERENTIAL
Eosinophils Absolute: 0.2 10*3/uL (ref 0.0–0.7)
Eosinophils Relative: 1 % (ref 0–5)
Lymphs Abs: 2.5 10*3/uL (ref 0.7–4.0)
Monocytes Relative: 7 % (ref 3–12)

## 2011-02-25 LAB — COMPREHENSIVE METABOLIC PANEL
Albumin: 3.4 g/dL — ABNORMAL LOW (ref 3.5–5.2)
Alkaline Phosphatase: 100 U/L (ref 39–117)
BUN: 26 mg/dL — ABNORMAL HIGH (ref 6–23)
Chloride: 99 mEq/L (ref 96–112)
Glucose, Bld: 94 mg/dL (ref 70–99)
Potassium: 5.1 mEq/L (ref 3.5–5.1)
Total Bilirubin: 0.2 mg/dL — ABNORMAL LOW (ref 0.3–1.2)

## 2011-02-25 LAB — URINALYSIS, ROUTINE W REFLEX MICROSCOPIC
Bilirubin Urine: NEGATIVE
Ketones, ur: NEGATIVE mg/dL
Nitrite: NEGATIVE
Protein, ur: NEGATIVE mg/dL
Urobilinogen, UA: 0.2 mg/dL (ref 0.0–1.0)

## 2011-02-25 LAB — CBC
MCH: 29.6 pg (ref 26.0–34.0)
MCV: 90.1 fL (ref 78.0–100.0)
Platelets: 267 10*3/uL (ref 150–400)
RDW: 14.2 % (ref 11.5–15.5)

## 2011-02-25 LAB — TROPONIN I: Troponin I: <0.3 ng/mL (ref <0.30)

## 2011-02-25 LAB — LIPASE, BLOOD: Lipase: 19 U/L (ref 11–59)

## 2011-02-26 ENCOUNTER — Inpatient Hospital Stay (HOSPITAL_COMMUNITY): Payer: Medicare Other

## 2011-02-26 DIAGNOSIS — M7989 Other specified soft tissue disorders: Secondary | ICD-10-CM

## 2011-02-26 DIAGNOSIS — M79609 Pain in unspecified limb: Secondary | ICD-10-CM

## 2011-02-26 LAB — DIFFERENTIAL
Eosinophils Absolute: 0.1 10*3/uL (ref 0.0–0.7)
Lymphocytes Relative: 27 % (ref 12–46)
Lymphs Abs: 3.6 10*3/uL (ref 0.7–4.0)
Monocytes Relative: 6 % (ref 3–12)
Neutro Abs: 9 10*3/uL — ABNORMAL HIGH (ref 1.7–7.7)
Neutrophils Relative %: 66 % (ref 43–77)

## 2011-02-26 LAB — COMPREHENSIVE METABOLIC PANEL
ALT: 11 U/L (ref 0–35)
AST: 28 U/L (ref 0–37)
Albumin: 2.9 g/dL — ABNORMAL LOW (ref 3.5–5.2)
CO2: 29 mEq/L (ref 19–32)
Calcium: 8.9 mg/dL (ref 8.4–10.5)
Chloride: 103 mEq/L (ref 96–112)
GFR calc non Af Amer: 36 mL/min — ABNORMAL LOW (ref >60)
Sodium: 138 mEq/L (ref 135–145)
Total Bilirubin: 0.3 mg/dL (ref 0.3–1.2)

## 2011-02-26 LAB — URINE CULTURE
Culture  Setup Time: 201209112205
Culture: NO GROWTH

## 2011-02-26 LAB — RAPID URINE DRUG SCREEN, HOSP PERFORMED
Cocaine: NOT DETECTED
Opiates: NOT DETECTED
Tetrahydrocannabinol: NOT DETECTED

## 2011-02-26 LAB — CARDIAC PANEL(CRET KIN+CKTOT+MB+TROPI)
CK, MB: 13.6 ng/mL (ref 0.3–4.0)
CK, MB: 20.1 ng/mL (ref 0.3–4.0)
Relative Index: 1 (ref 0.0–2.5)
Total CK: 1338 U/L — ABNORMAL HIGH (ref 7–177)
Total CK: 2673 U/L — ABNORMAL HIGH (ref 7–177)

## 2011-02-26 LAB — CBC
Hemoglobin: 11.7 g/dL — ABNORMAL LOW (ref 12.0–15.0)
Platelets: 229 10*3/uL (ref 150–400)
RBC: 4.06 MIL/uL (ref 3.87–5.11)

## 2011-02-26 LAB — PROTIME-INR: INR: 1.11 (ref 0.00–1.49)

## 2011-02-26 LAB — D-DIMER, QUANTITATIVE: D-Dimer, Quant: 1.55 ug/mL-FEU — ABNORMAL HIGH (ref 0.00–0.48)

## 2011-02-26 LAB — LACTATE DEHYDROGENASE: LDH: 249 U/L (ref 94–250)

## 2011-02-26 LAB — TSH: TSH: 0.638 u[IU]/mL (ref 0.350–4.500)

## 2011-02-26 NOTE — Discharge Summary (Signed)
[name], [name]                 ACCOUNT NO.:  [id number]  MEDICAL RECORD NO.:  [id number]  LOCATION:  3035                         FACILITY:  [facility]  PHYSICIAN:  Mariselda Badalamenti I Casper Pagliuca, MD      DATE OF BIRTH:  June 29, 1933  DATE OF ADMISSION:  01/15/2011 DATE OF DISCHARGE:  01/20/2011                              DISCHARGE SUMMARY   DISCHARGE DIAGNOSES: 1. Acute left frontal lobe nonhemorrhagic infarct. 2. Acute infarct, right motor strip. 3. Recurrent bilateral ICA stenosis, and planned elective stent by Dr.     Edilia Bo as an outpatient, and Dr. Imogene Burn, the patient's vascular     surgeon, will provide followup of this to make sure the patient has     a followup within the next month with Dr. Imogene Burn and Dr. Edilia Bo for     evaluation of her bilateral ICA stenosis for elective     revascularization. 4. Klebsiella oxytoca urinary tract infection, completed 5 days of     treatment. 5. Chronic renal failure stage II. 6. Peripheral vascular disease of her lower extremities.  This is     followed up with Dr. Imogene Burn. 7. Abdominal aortic aneurysm.  This is followed up with ultrasound     duplex as an outpatient. 8. Hyperlipidemia. 9. Chronic bilateral lower extremity weakness.  The patient is rated     as high risk for fall. 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  This is     needed to be followed up Dr. Corliss Skains as an outpatient as the     patient currently is asymptomatic.  No intervention done.  Case     discussed with Dr. Corliss Skains. 11.Status post nephrectomy for congenital anomaly. 12.History of chronic diarrhea secondary to colonic dysmotility from     narcotics. 13.Multiple fractures secondary to fall with hip and femur fracture. 14.Ongoing tobacco abuse. 15.Reviewed diffuse atherosclerotic disease. 16.Hypertension.  DISCHARGE MEDICATIONS: 1. Lyrica 75 mg b.i.d. 2. Imipramine 25 mg every evening. 3. Fish oil 1200 mg 2 tabs daily. 4. Zetia 10 mg p.o. every  evening. 5. Zyrtec 1 tab daily. 6. Vitamin D3 one tab daily. 7. Aspirin 81 mg p.o. daily, enteric coated. 8. Plavix 75 mg p.o. daily. 9. Lipitor 20 mg p.o. at bedtime.  CONSULTATIONS:  Neurology consultation, done by Dr. Pearlean Brownie.  Stroke team and vascular surgery done by Dr. Consepcion Hearing and followup with Dr. Imogene Burn.  PROCEDURES: 1. Hip x-ray, status post left hip arthroplasty and also distal left     femur.  No evidence of hardware complication. 2. L-spine, no fracture or dislocation. 3. Chest x-ray, no evidence of acute cardiopulmonary process. 4. CT head without contrast, hypodensities in the cortical and     subcortical left frontal lobe with some acute to subacute infarct. 5. MRI of the brain, remote anterior left frontal lobe infarct with     encephalomalacia and laminar necrosis.  Posterior to this level,     this is a small acute mass that was seen in the frontal lobe,     nonhemorrhagic infarct, tiny acute infarct, right motor strip. 6. MRA left internal carotid artery ophthalmic 2.8 mm aneurysm,     intracranial  atherosclerotic type changes. 7. Ultrasound of the aorta, fusiform infrarenal abdominal aortic     aneurysm, maximal 4.6 x 4.3; on previous study 4.1 x 3.8,     __________ there is some associated thrombus. 8. X-ray of the tibia, no acute abnormality. 9. Echo, wall thickness increase of moderate to severe LVH.  Systolic     function mildly to moderately reduced.  EF 40% to 45%.  Diffuse     hypokinesia and parameter consistent with abnormal left ventricular     relaxation, grade I diastolic dysfunction.  HISTORY OF PRESENT ILLNESS:  [name] is a lady with chronic illness. She is wheelchair bound and transfers from bed to wheelchair with some difficulty.  She has fallen in the process in the past with fracture. As she was transferring from her bed to her wheelchair, she had experienced a fall.  She laid on the floor until at least 8 a.m. when a repairman came by.   He called EMS who put her back in bed.  She refused to come to the emergency room at that time, then subsequently because of her continued weakness and difficulty transferring, she decided to come to the emergency room later this evening.  She describes just a frontal region infarct and MRI indicates a stroke.  She is not a good a TPA candidate or full anticoagulation.  She is known to have severe atherosclerotic disease and she has been a heavy smoker.  For code status, the patient is DNR/DNI.  1. [name] is a 77 year old female with a history of severe     atherosclerotic disease who presented with a fall and MRI suggests     2  bilateral frontal lobe strokes.  The neurology team saw and     evaluated the patient recommending aspirin and Plavix.  Because of     the patient's current presentation, is likely to recurrent RCA     stenosis. 2. The patient also has cardiomegaly with EF 40% to 45% with diffuse     hypokinesia and diastolic dysfunction.  The patient denies any     chest pain or shortness of breath, and on presentation to the     hospital, the patient did have cardiac enzyme drawn which was     negative.  The patient may need to have a cardiac catheterization     to look over her coronary arteries, but at this time as the patient     denies any chest pain or shortness of breath and cardiac enzyme     negative, no further workup done during hospital stay and her     coronary calcification is expected with a history of severe     peripheral artery disease. 3. Bilateral ICA stenosis.  This will need to be followed up by Dr.     Imogene Burn as an outpatient.  To arrange revascularization. 4. Abdominal aortic aneurysm, also need to be followed up by Dr. Imogene Burn     for further recommendations. 5. UTI.  The patient completed course of treatment. 6. Tobacco cessation counseling done during hospital stay and the     patient would like to resume smoking despite the current risk. 7. She has  chronic diarrhea.  The patient will be discharged with     Imodium p.o. daily as needed.     Vaughn Frieze Bosie Helper, MD     HIE/MEDQ  D:  01/20/2011  T:  01/20/2011  Job:  [id number]  cc:   Valeria Batman,  M.D. Di Kindle. Edilia Bo, M.D.  Electronically Signed by Ebony Cargo MD on 02/26/2011 03:35:31 PM

## 2011-02-27 ENCOUNTER — Encounter: Payer: Self-pay | Admitting: Vascular Surgery

## 2011-02-27 ENCOUNTER — Inpatient Hospital Stay (HOSPITAL_COMMUNITY): Payer: Medicare Other

## 2011-02-27 LAB — COMPREHENSIVE METABOLIC PANEL
Albumin: 2.6 g/dL — ABNORMAL LOW (ref 3.5–5.2)
BUN: 20 mg/dL (ref 6–23)
Creatinine, Ser: 1.19 mg/dL — ABNORMAL HIGH (ref 0.50–1.10)
Total Bilirubin: 0.4 mg/dL (ref 0.3–1.2)
Total Protein: 6.1 g/dL (ref 6.0–8.3)

## 2011-02-27 LAB — CARDIAC PANEL(CRET KIN+CKTOT+MB+TROPI)
CK, MB: 5.9 ng/mL — ABNORMAL HIGH (ref 0.3–4.0)
Troponin I: <0.3 ng/mL (ref <0.30)

## 2011-02-28 ENCOUNTER — Other Ambulatory Visit: Payer: Self-pay

## 2011-02-28 ENCOUNTER — Ambulatory Visit: Payer: Self-pay | Admitting: Vascular Surgery

## 2011-02-28 LAB — CBC
HCT: 33.5 % — ABNORMAL LOW (ref 36.0–46.0)
MCV: 90.1 fL (ref 78.0–100.0)
Platelets: 224 10*3/uL (ref 150–400)
RBC: 3.72 MIL/uL — ABNORMAL LOW (ref 3.87–5.11)
WBC: 6.6 10*3/uL (ref 4.0–10.5)

## 2011-03-04 LAB — CULTURE, BLOOD (ROUTINE X 2)
Culture  Setup Time: 201209120849
Culture: NO GROWTH

## 2011-03-12 ENCOUNTER — Emergency Department (HOSPITAL_COMMUNITY): Payer: Medicare Other

## 2011-03-12 ENCOUNTER — Emergency Department (HOSPITAL_COMMUNITY)
Admission: EM | Admit: 2011-03-12 | Discharge: 2011-03-12 | Disposition: A | Discharge status: Home / Self Care | Payer: Medicare Other | Attending: Emergency Medicine | Admitting: Emergency Medicine

## 2011-03-12 DIAGNOSIS — R109 Unspecified abdominal pain: Secondary | ICD-10-CM | POA: Insufficient documentation

## 2011-03-12 DIAGNOSIS — Z79899 Other long term (current) drug therapy: Secondary | ICD-10-CM | POA: Insufficient documentation

## 2011-03-12 DIAGNOSIS — Z9889 Other specified postprocedural states: Secondary | ICD-10-CM | POA: Insufficient documentation

## 2011-03-12 DIAGNOSIS — R5381 Other malaise: Secondary | ICD-10-CM | POA: Insufficient documentation

## 2011-03-12 DIAGNOSIS — I10 Essential (primary) hypertension: Secondary | ICD-10-CM | POA: Insufficient documentation

## 2011-03-12 DIAGNOSIS — R5383 Other fatigue: Secondary | ICD-10-CM | POA: Insufficient documentation

## 2011-03-12 LAB — URINALYSIS, ROUTINE W REFLEX MICROSCOPIC
Bilirubin Urine: NEGATIVE
Nitrite: NEGATIVE
Specific Gravity, Urine: 1.013 (ref 1.005–1.030)
pH: 6 (ref 5.0–8.0)

## 2011-03-12 LAB — POCT I-STAT, CHEM 8
Chloride: 101 mEq/L (ref 96–112)
Glucose, Bld: 91 mg/dL (ref 70–99)
HCT: 42 % (ref 36.0–46.0)
Potassium: 4.4 mEq/L (ref 3.5–5.1)
Sodium: 136 mEq/L (ref 135–145)

## 2011-03-12 LAB — CBC
HCT: 39.5 % (ref 36.0–46.0)
Hemoglobin: 13.1 g/dL (ref 12.0–15.0)
MCV: 88.8 fL (ref 78.0–100.0)
RBC: 4.45 MIL/uL (ref 3.87–5.11)
WBC: 15.3 10*3/uL — ABNORMAL HIGH (ref 4.0–10.5)

## 2011-03-12 LAB — DIFFERENTIAL
Eosinophils Relative: 1 % (ref 0–5)
Lymphocytes Relative: 21 % (ref 12–46)
Lymphs Abs: 3.2 10*3/uL (ref 0.7–4.0)
Neutro Abs: 10.8 10*3/uL — ABNORMAL HIGH (ref 1.7–7.7)
Neutrophils Relative %: 71 % (ref 43–77)

## 2011-03-13 LAB — CBC
HCT: 32.7 — ABNORMAL LOW
HCT: 36.1
Hemoglobin: 11.5 — ABNORMAL LOW
Hemoglobin: 12
Hemoglobin: 12.8
MCHC: 32.9
MCHC: 33.1
MCHC: 33.6
MCHC: 34.2
MCV: 91
MCV: 91.5
MCV: 92.4
MCV: 93
Platelets: 188
RBC: 3.59 — ABNORMAL LOW
RBC: 3.73 — ABNORMAL LOW
RBC: 4.18
RBC: 5.08
RDW: 14.5
RDW: 14.6
WBC: 10.9 — ABNORMAL HIGH
WBC: 9.1

## 2011-03-13 LAB — BASIC METABOLIC PANEL
BUN: 23
CO2: 28
CO2: 32
Calcium: 9.5
Chloride: 103
Chloride: 105
Creatinine, Ser: 1.29 — ABNORMAL HIGH
GFR calc Af Amer: 42 — ABNORMAL LOW
GFR calc Af Amer: 49 — ABNORMAL LOW
Glucose, Bld: 90
Potassium: 4.8
Potassium: 5.4 — ABNORMAL HIGH
Sodium: 138
Sodium: 141

## 2011-03-13 LAB — URINALYSIS, ROUTINE W REFLEX MICROSCOPIC
Bilirubin Urine: NEGATIVE
Ketones, ur: NEGATIVE
Protein, ur: NEGATIVE
Urobilinogen, UA: 0.2

## 2011-03-13 LAB — TYPE AND SCREEN: Antibody Screen: NEGATIVE

## 2011-03-13 LAB — DIFFERENTIAL
Basophils Relative: 0
Monocytes Relative: 7
Neutro Abs: 6
Neutrophils Relative %: 66

## 2011-03-13 LAB — PROTIME-INR: Prothrombin Time: 13.9

## 2011-03-16 NOTE — H&P (Signed)
[name], [name] NO.:  [id number]  MEDICAL RECORD NO.:  [id number]  LOCATION:  [facility]                         FACILITY:  [facility]  PHYSICIAN:  Eduard Clos, MDDATE OF BIRTH:  March 24, 1934  DATE OF ADMISSION:  02/25/2011 DATE OF DISCHARGE:                             HISTORY & PHYSICAL   PRIMARY CARE PHYSICIAN:  Lupita Raider, MD at [place].  CHIEF COMPLAINT:  Rigors.  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  Presently in ER because of sudden onset of rigors since last afternoon.  Upon presenting to ER, the patient was found to be febrile, 100.9 and leukocytosis.  At this time, chest x-ray and UA does not reveal any particular infection. Lactic acid and her calcium levels are within acceptable limits.  The patient has been admitted for further observation.  The patient at this time is tachycardic.  Denies any headache, any cough or phlegm.  Denies any chest pain or shortness of breath.  Denies any nausea, vomiting, abdominal pain, or dysuria, discharge, or diarrhea.  The patient usually has chronic diarrhea, but over the last 1 week, she did not have.  The patient after being discharged was at rehab for the last 3 weeks and had just been discharged home last 2 days.  The patient has not had any dizziness or loss of consciousness.  Denies any new focal deficit.  PAST MEDICAL HISTORY: 1. Recent left frontal lobe nonhemorrhagic infarct and history of     bilateral carotid endarterectomy with extensive history of     abdominal aortic aneurysm, last checked on January 16, 2011, was 4.6     x 4.3 cm. 2.  History of peripheral vascular disease with chronic-looking     gangrenous toes on the left foot, lateral 2 toes. 3. 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 4. Abdominal aortic aneurysm, last measured on January 16, 2011, was 4.6     x 4.3 cm, followed by Vascular Surgery. 5. History of chronic diarrhea, probably related to colonic     dysmotility from narcotics. 6. History of multiple fractures secondary to fall and femur fracture. 7. History of ongoing tobacco abuse. 8. History of hypertension.  MEDICATIONS PRIOR TO ADMISSION: 1. Zyrtec. 2. Zetia. 3. Vitamin D3. 4. Lyrica 75 p.o. 1 capsule twice daily. 5. Lipitor 20 mg daily at bedtime. 6. Imodium p.r.n. 7. Imipramine 25 mg evening. 8. Fish oil. 9. Clopidogrel 75 mg p.o. daily with meal. 10.Aspirin 81 mg daily.  ALLERGIES:  CODEINE, PENICILLIN, MACROLIDES.  FAMILY HISTORY:  Noncontributory.  SOCIAL HISTORY:  The patient lives with her son.  Smokes cigarettes. Denies any alcohol or drug abuse.  REVIEW OF SYSTEMS:  As present in the history of presenting illness, nothing else significant.  PHYSICAL EXAMINATION:  GENERAL:  The patient examined at bedside, not in acute distress. VITAL SIGNS:  Blood pressure 120/50, blood pressure is going  between 120 sometimes and is coming as low as 95 systolic; pulse is 104 per minute, sinus; temperature 100.9; respirations 20 per minute; O2 sat is 100% on room air. HEENT:  Anicteric.  No pallor.  No discharge from ears, eyes, nose, or mouth. CHEST:  Bilateral air entry present.  No rhonchi.  No crepitation. HEART:  S1 and S2 heard. ABDOMEN:  Soft, nontender.  Bowel sounds heard. CNS:  The patient alert, awake, oriented to time, place, and person. Moves upper and lower extremities 5/5. EXTREMITIES:  There are some gangrenous-looking areas in the left foot, last 2 toes.  Has chronic atrophic changes in both lower extremities. There is no edema.  LABORATORY  DATA:  EKG shows sinus tachycardia with heart rate around 105 beats per minute, left bundle branch block.  LBBB was present even in the EKG in August.  Chest x-ray shows cardiomegaly, interstitial crowding likely secondary to hyperaeration, obscured left costophrenic angle is likely technical, however, PA and lateral radiographs recommended when the patient can tolerate to exclude a retrocardiac process.  CBC, WBC is 17.3, hemoglobin is 13.4, hematocrit is 40.8, platelets 267. Complete metabolic panel, sodium 135, potassium 5.1, chloride 99, carbon dioxide 27, glucose 94, BUN 26, creatinine 1.2, total bilirubin is 0.2; alk phos 100, AST 20, ALT 14, total protein 7.5, albumin 3.4, calcium 9.1, procalcitonin less than 0.1, lactic acid 1.3.  CK is 213, MB is 5.3, index is 2.5, troponin less than 0.3.  UA is negative for nitrite, leukocytes trace, wbc's 0.  Cultures are pending.  ASSESSMENT: 1. Systemic inflammatory response syndrome. 2. Recent admission for cerebrovascular accident last month. 3. History of bilateral carotid endarterectomy with stenosis. 4. History of abdominal aortic aneurysm, presently abdominal painfree.  Being followed by Vascular Surgery. 5. Chronic left bundle-branch block. 6. Ongoing tobacco abuse. 7. Chronic renal failure with left-sided nephrectomy.  Creatinine at     baseline. 8. Peripheral vascular disease with chronic-looking gangrenous toes in     the left foot, lateral 2 toes. 9. History of chronic diarrhea. 10.History of hypertension. 11.Recent admission had urinary tract infection.  PLAN: 1. At this time, admit patient to step-down unit. 2. For her rigors, at this time we are not able to see exact source     for her fever.  We are going to repeat blood cultures, urine     cultures.  Repeat chest x-ray again in a.m.  Once the patient is     more stable, we need to get a PA and lateral chest x-rays.  The     patient at this time does not have any  cough or phlegm.  Does not     have any dysuria or diarrhea.  Does not have any headache.  We will     closely observe the patient in step-down. 3. For now, we will continue present home medication. 4. Further recommendation based on the test order and clinical course. 5. The patient is a full code as I have discussed with the patient at     this time.     Eduard Clos, MD     ANK/MEDQ  D:  02/26/2011  T:  02/26/2011  Job:  [id number]  cc:   Lupita Raider, M.D.  Electronically Signed by Midge Minium MD on 03/16/2011 11:57:07 AM

## 2011-03-17 LAB — CBC
HCT: 28.5 — ABNORMAL LOW
HCT: 29.5 — ABNORMAL LOW
HCT: 27.6 — ABNORMAL LOW
HCT: 28.5 — ABNORMAL LOW
HCT: 28.6 — ABNORMAL LOW
HCT: 28.9 — ABNORMAL LOW
HCT: 30.4 — ABNORMAL LOW
HCT: 35.4 — ABNORMAL LOW
Hemoglobin: 9.6 — ABNORMAL LOW
Hemoglobin: 9.9 — ABNORMAL LOW
Hemoglobin: 11.4 — ABNORMAL LOW
Hemoglobin: 9.3 — ABNORMAL LOW
Hemoglobin: 9.5 — ABNORMAL LOW
Hemoglobin: 9.5 — ABNORMAL LOW
Hemoglobin: 9.6 — ABNORMAL LOW
Hemoglobin: 9.9 — ABNORMAL LOW
MCHC: 33.6
MCHC: 32.3
MCHC: 32.4
MCHC: 33
MCHC: 33.3
MCHC: 33.4
MCHC: 33.5
MCV: 88.7
MCV: 86.3
MCV: 87.3
MCV: 87.4
MCV: 87.5
Platelets: 236
Platelets: 239
Platelets: 286
Platelets: 376
Platelets: 382
Platelets: 389
Platelets: 417 — ABNORMAL HIGH
Platelets: 444 — ABNORMAL HIGH
RBC: 3.17 — ABNORMAL LOW
RBC: 3.18 — ABNORMAL LOW
RBC: 3.26 — ABNORMAL LOW
RBC: 3.31 — ABNORMAL LOW
RBC: 3.36 — ABNORMAL LOW
RBC: 3.47 — ABNORMAL LOW
RDW: 15.4
RDW: 15.6 — ABNORMAL HIGH
RDW: 15.6 — ABNORMAL HIGH
RDW: 16 — ABNORMAL HIGH
RDW: 16.3 — ABNORMAL HIGH
RDW: 16.5 — ABNORMAL HIGH
RDW: 16.9 — ABNORMAL HIGH
WBC: 7
WBC: 7.3
WBC: 7
WBC: 8
WBC: 8.2
WBC: 8.5
WBC: 8.5

## 2011-03-17 LAB — BASIC METABOLIC PANEL
BUN: 14
BUN: 16
BUN: 12
BUN: 15
CO2: 29
CO2: 24
CO2: 25
CO2: 27
Calcium: 7.7 — ABNORMAL LOW
Calcium: 8.1 — ABNORMAL LOW
Calcium: 8.6
Chloride: 105
Chloride: 105
Creatinine, Ser: 1.37 — ABNORMAL HIGH
Creatinine, Ser: 1.17
Creatinine, Ser: 1.37 — ABNORMAL HIGH
GFR calc Af Amer: 39 — ABNORMAL LOW
GFR calc Af Amer: 46 — ABNORMAL LOW
GFR calc non Af Amer: 33 — ABNORMAL LOW
GFR calc non Af Amer: 35 — ABNORMAL LOW
GFR calc non Af Amer: 38 — ABNORMAL LOW
GFR calc non Af Amer: 45 — ABNORMAL LOW
Glucose, Bld: 108 — ABNORMAL HIGH
Glucose, Bld: 122 — ABNORMAL HIGH
Glucose, Bld: 79
Glucose, Bld: 89
Potassium: 4.3
Potassium: 4.6
Potassium: 4
Potassium: 4.3
Potassium: 4.9
Sodium: 132 — ABNORMAL LOW
Sodium: 136
Sodium: 136
Sodium: 139

## 2011-03-17 LAB — DIFFERENTIAL
Basophils Absolute: 0
Basophils Relative: 0
Basophils Relative: 1
Eosinophils Relative: 6 — ABNORMAL HIGH
Lymphocytes Relative: 22
Monocytes Absolute: 0.4
Monocytes Absolute: 0.5
Monocytes Relative: 5
Neutro Abs: 4.2
Neutrophils Relative %: 59

## 2011-03-17 LAB — COMPREHENSIVE METABOLIC PANEL
Albumin: 2 — ABNORMAL LOW
Alkaline Phosphatase: 65
BUN: 11
Calcium: 8.2 — ABNORMAL LOW
Creatinine, Ser: 0.97
Glucose, Bld: 78
Total Protein: 4.4 — ABNORMAL LOW

## 2011-03-17 LAB — CROSSMATCH
ABO/RH(D): O POS
Antibody Screen: NEGATIVE

## 2011-03-17 LAB — POCT I-STAT, CHEM 8
HCT: 37
Hemoglobin: 12.6
Potassium: 4.5
Sodium: 142

## 2011-03-17 LAB — HEMOGLOBIN AND HEMATOCRIT, BLOOD
HCT: 34.8 — ABNORMAL LOW
Hemoglobin: 11.5 — ABNORMAL LOW

## 2011-03-17 LAB — PROTIME-INR
INR: 1.1
Prothrombin Time: 14.1

## 2011-03-18 LAB — URINE CULTURE
Colony Count: >=100000
Special Requests: POSITIVE

## 2011-03-18 LAB — URINALYSIS, MICROSCOPIC ONLY
Bilirubin Urine: NEGATIVE
Glucose, UA: NEGATIVE
Ketones, ur: NEGATIVE
Nitrite: POSITIVE — AB
Protein, ur: NEGATIVE
pH: 6

## 2011-03-18 LAB — CBC
HCT: 33.6 — ABNORMAL LOW
Hemoglobin: 10.8 — ABNORMAL LOW
MCHC: 32.2
MCHC: 32.3
MCV: 88.9
MCV: 89.8
Platelets: 349
RBC: 3.74 — ABNORMAL LOW
RDW: 19.5 — ABNORMAL HIGH
WBC: 5.9

## 2011-03-18 LAB — BASIC METABOLIC PANEL
BUN: 7
CO2: 28
CO2: 28
Chloride: 102
Chloride: 105
Creatinine, Ser: 0.99
GFR calc Af Amer: >60
Glucose, Bld: 91
Potassium: 4.5

## 2011-03-19 LAB — ABO/RH: ABO/RH(D): O POS

## 2011-03-19 LAB — DIFFERENTIAL
Basophils Relative: 1
Lymphocytes Relative: 41
Lymphs Abs: 3.8
Monocytes Relative: 8
Neutro Abs: 4.2
Neutrophils Relative %: 45

## 2011-03-19 LAB — URINALYSIS, ROUTINE W REFLEX MICROSCOPIC
Bilirubin Urine: NEGATIVE
Nitrite: NEGATIVE
Specific Gravity, Urine: 1.01
Urobilinogen, UA: 0.2

## 2011-03-19 LAB — TYPE AND SCREEN: Antibody Screen: NEGATIVE

## 2011-03-19 LAB — BASIC METABOLIC PANEL
Calcium: 9.5
Creatinine, Ser: 1.81 — ABNORMAL HIGH
GFR calc Af Amer: 33 — ABNORMAL LOW
GFR calc non Af Amer: 27 — ABNORMAL LOW
Sodium: 139

## 2011-03-19 LAB — URINE MICROSCOPIC-ADD ON

## 2011-03-19 LAB — POCT I-STAT 4, (NA,K, GLUC, HGB,HCT)
Glucose, Bld: 113 — ABNORMAL HIGH
Potassium: 5

## 2011-03-19 LAB — CBC
Hemoglobin: 13.7
MCHC: 33
RBC: 4.64
WBC: 9.3

## 2011-03-19 LAB — APTT: aPTT: 26

## 2011-03-19 LAB — PROTIME-INR: INR: 1

## 2011-03-27 NOTE — Discharge Summary (Signed)
[name], [name] NO.:  [id number]  MEDICAL RECORD NO.:  [id number]  LOCATION:  4709                         FACILITY:  [facility]  PHYSICIAN:  Rosanna Randy, MDDATE OF BIRTH:  18-Jan-1934  DATE OF ADMISSION:  02/25/2011 DATE OF DISCHARGE:  02/28/2011                              DISCHARGE SUMMARY   DISCHARGING DIAGNOSES: 1. SIRS (systemic inflammatory response syndrome) secondary to most     likely bronchiectasis. 2. Pyuria without any growth on the patient's urine culture. 3. Tobacco abuse. 4. History of stroke. 5. Hyperlipidemia. 6. Neuropathy. 7. History of abdominal aortic aneurysm followed by Vascular Surgery,     and last check on January 16, 2011. 8. History of a chronic diarrhea, most likely secondary to colonic     dysmotility. 9. History of multiple fractures secondary to traumatic fall including     hip and femur. 10.History of hypertension. 11.Allergic rhinitis. 12.Chronic renal failure, stage II. 13.Mild rhabdomyolysis.  DISCHARGE MEDICATIONS: 1. Levofloxacin 500 mg 1 tablet by mouth daily in order to take for     the next 6 days to complete antibiotic therapy. 2. Aspirin 81 mg 1 tablet by mouth daily. 3. Plavix 75 mg 1 tablet by mouth daily. 4. Fish oil 1200 mg 2 capsules by mouth every morning. 5. Imipramine 25 mg 1 tablet by mouth every evening. 6. Imodium 2 g to take 1 tablet by mouth q.8 hours as needed for     diarrhea. 7. Lipitor 20 mg 1 tablet by mouth daily at bedtime. 8. Lyrica 75 mg 1 capsule by mouth twice a day. 9. Vitamin D3 2000 units over the counter 1 tablet by mouth every     morning. 10.Zetia 10 mg 1 tablet by mouth every evening. 11.Zyrtec 10 mg over the counter 1 tablet by mouth daily as needed for     allergies.  DISPOSITION AND FOLLOWUP:  The patient had been discharged in stable and improved condition, currently not having any fever, any cough, any chest pain, and with a complete normal vital signs.  At  this point, the patient will continue her antibiotics and finish her treatment as instructed.  She had been advised to stop smoking to follow a heart- healthy, low-sodium diet, to take her medications as prescribed, and to follow up with primary care physician for further adjustment of her chronic medications and to make sure that her symptoms have completely go away.  The patient had already set up Home Health Physical Therapy and Home Health Occupational Therapy to continue helping her regaining her strength and to provide some physical rehabilitation.  PROCEDURE PERFORMED DURING THIS HOSPITALIZATION:  The patient had a chest x-ray performed on February 25, 2011, that demonstrated cardiomegaly with interstitial scarring, likely secondary to hypoaeration.  There is an obscured left costophrenic angle, likely technical.  However, a PA and lateral radiograph recommended when the patient can tolerate to exclude a retrocardiac process.  On February 26, 2011, the followup x-ray demonstrated unchanged mediastinal contours with a grossly unchanged bibasilar heterogeneous opacities, left greater than right.  There was no other focal parenchymal changes appreciated. The patient had another chest x-ray on February 27, 2011, that  demonstrated no effusion and demonstrated mild cardiomegaly.  There was no bony abnormalities seen.  No other procedures were performed during this hospitalization.  CONSULTATIONS:  No consultations were made during this admission.  HISTORY OF PRESENT ILLNESS:  For full details, please refer to dictation done by Dr. Toniann Fail on February 26, 2011, but briefly, this is a 77- year-old female with a known history of a recent CVA, chronic lower extremity weakness, chronic renal failure stage II with a history of left-sided nephrectomy due to congenital abnormality.  The patient also had a history of abdominal aortic aneurysm which is followed by Vascular Surgery,  history of peripheral vascular disease, bilateral carotid endarterectomy secondary to a stenosis, and a recent UTI due to Klebsiella oxytoca in August 2012.  The patient presented to the emergency department because of sudden onset of rigors since last afternoon and was found to be afebrile with 100.9 fever and leukocytosis.  Chest x-ray and urinalysis done initially did not demonstrate any signs of infection but due to the findings with the patient ruled in for SIRS, the patient was admitted for further evaluation and treatment.  PERTINENT LABORATORY DATA:  Throughout this hospitalization includes a CBC with differential showing a white blood cells of 17.3, a hemoglobin of 13.4, platelets 267.  Urinalysis with a trace of leukocytes and a microscopy showing a 3-6 white blood cells, lipase was 19.  A comprehensive metabolic panel demonstrated abnormal BUN of 26, creatinine 1.26, and albumin 3.4, otherwise the comprehensive metabolic panel was within normal limits.  Cardiac markers negative x3. Procalcitonin level less than 10, lactic acid 1.3.  Urine drug screen was negative.  MRSA PCR screening was negative.  A D-dimer was 1.55. Magnesium 2.1.  There was no growth on the urine culture even that the patient was already receiving some antibiotics at the moment that was taking.  LDH was 249.  The patient with creatine kinase of 1338.  At discharge, the patient had a sodium of 138, potassium 4.6, chloride 104, bicarb 23, glucose 86, BUN 20, creatinine 1.19.  Normal LFTs.  CBC with a white blood cell of 6.6, hemoglobin 10.7, platelets 224, blood cultures were negative.  HOSPITAL COURSE BY PROBLEM: 1. SIRS.  Etiology most likely due to a mild urinary tract infection     with a bronchiectasis in a patient that smokes, that has     emphysematous changes.  The patient, after admission, reported     having some cough and yellowish sputum while she was at home prior     coming in to the  hospital.  She had been treated with the use of     Levaquin initially through her veins and now she is going to     complete treatment with 500 mg by mouth daily for 6 more days.  She     is going to follow up with primary care physician and at this     point, the whole SIRS process and fever has completely resolved.     The patient is completely asymptomatic and her vital signs are     stable.  We are going to discharge her home. 2. History of stroke.  We are going to continue using aspirin, Plavix,     and also the patient had been advised to stop smoking. 3. Hyperlipidemia.  We are going to continue using Zetia, Lipitor, and     fish oil.  The patient was advised to follow a low-fat diet. 4. Acute on chronic  renal failure, most likely all secondary to mild     rhabdomyolysis.  She received fluid resuscitation and at discharge,     her renal function is back to baseline. 5. Mild rhabdomyolysis, all secondary to the fever/rigors that she was     having and also dehydration, most likely all driven by the     infection.  She received fluid resuscitation and at discharge, her     CK total was pretty much back to normal, it was in the 200s range.     The patient had been advised to keep herself hydrated. 6. Hypertension, stable.  We are going to continue using a low-sodium     diet to try to keep her blood pressure under control. 7. Chronic diarrhea.  There was no findings suggesting Clostridium     difficile infection, so most likely colonic dysmotility as a cause.     We are going to continue using p.r.n. Imodium. 8. Neuropathy.  We are going to continue using Lyrica. 9. Allergic rhinitis.  We are going to continue using Zyrtec. 10.Abdominal aneurysm that appears to be stable on most recent     abdominal ultrasound.  She will continue following with a vascular     surgeon to determine further treatment for her aneurysm.  The rest of her medical problems remained stable throughout  this hospitalization and at this point, we are not going to make any changes to her medications.  She will follow with primary care physician for further adjustment as an outpatient.  As part of the patient's physical deconditioning and physical rehab needed after the stroke, Home Health PT, Home Health OT have been already arranged, and she will be following their instructions as an outpatient.  PHYSICAL EXAMINATION:  VITAL SIGNS:  At discharge, the patient's vital signs demonstrated a temperature of 98.4 with a heart rate of 80, respiratory rate 20, blood pressure 147/97, oxygen saturation was 92% on room air. GENERAL:  She was in no acute distress. RESPIRATORY SYSTEM:  With a good air movement.  No wheezing, scattered rhonchi auscultated. HEART:  S1, S2, regular rate. ABDOMEN:  Soft, obese, nontender, nondistended.  Positive bowel sounds. EXTREMITIES:  No edema with gangrenous changes on her left fourth and fifth toes and decreased pulses bilaterally. NEUROLOGIC: Nonfocal.     Rosanna Randy, MD     CEM/MEDQ  D:  02/28/2011  T:  02/28/2011  Job:  [id number]  cc:   Lupita Raider, M.D.  Electronically Signed by Vassie Loll MD on 03/27/2011 07:58:55 AM

## 2011-04-30 ENCOUNTER — Observation Stay (HOSPITAL_COMMUNITY)
Admission: EM | Admit: 2011-04-30 | Discharge: 2011-05-01 | Disposition: A | Discharge status: Home / Self Care | Payer: Medicare Other | Attending: Internal Medicine | Admitting: Internal Medicine

## 2011-04-30 ENCOUNTER — Other Ambulatory Visit: Payer: Self-pay

## 2011-04-30 ENCOUNTER — Encounter (HOSPITAL_COMMUNITY): Payer: Self-pay | Admitting: *Deleted

## 2011-04-30 DIAGNOSIS — K922 Gastrointestinal hemorrhage, unspecified: Secondary | ICD-10-CM

## 2011-04-30 DIAGNOSIS — G629 Polyneuropathy, unspecified: Secondary | ICD-10-CM | POA: Diagnosis present

## 2011-04-30 DIAGNOSIS — I10 Essential (primary) hypertension: Secondary | ICD-10-CM

## 2011-04-30 DIAGNOSIS — R197 Diarrhea, unspecified: Secondary | ICD-10-CM | POA: Diagnosis present

## 2011-04-30 DIAGNOSIS — E785 Hyperlipidemia, unspecified: Secondary | ICD-10-CM | POA: Insufficient documentation

## 2011-04-30 DIAGNOSIS — E860 Dehydration: Secondary | ICD-10-CM | POA: Insufficient documentation

## 2011-04-30 DIAGNOSIS — Z8673 Personal history of transient ischemic attack (TIA), and cerebral infarction without residual deficits: Secondary | ICD-10-CM | POA: Insufficient documentation

## 2011-04-30 DIAGNOSIS — K625 Hemorrhage of anus and rectum: Principal | ICD-10-CM | POA: Insufficient documentation

## 2011-04-30 DIAGNOSIS — Z993 Dependence on wheelchair: Secondary | ICD-10-CM | POA: Insufficient documentation

## 2011-04-30 HISTORY — DX: Migraine, unspecified, not intractable, without status migrainosus: G43.909

## 2011-04-30 HISTORY — DX: Pneumonia, unspecified organism: J18.9

## 2011-04-30 HISTORY — DX: Essential (primary) hypertension: I10

## 2011-04-30 HISTORY — DX: Polyneuropathy, unspecified: G62.9

## 2011-04-30 HISTORY — DX: Headache: R51

## 2011-04-30 LAB — DIFFERENTIAL
Basophils Absolute: 0 10*3/uL (ref 0.0–0.1)
Basophils Relative: 0 % (ref 0–1)
Eosinophils Absolute: 0.1 10*3/uL (ref 0.0–0.7)
Eosinophils Relative: 1 % (ref 0–5)
Lymphocytes Relative: 29 % (ref 12–46)
Lymphs Abs: 2.4 10*3/uL (ref 0.7–4.0)
Monocytes Absolute: 0.7 10*3/uL (ref 0.1–1.0)
Monocytes Relative: 8 % (ref 3–12)
Neutro Abs: 5 10*3/uL (ref 1.7–7.7)
Neutrophils Relative %: 61 % (ref 43–77)

## 2011-04-30 LAB — COMPREHENSIVE METABOLIC PANEL WITH GFR
ALT: 7 U/L (ref 0–35)
AST: 14 U/L (ref 0–37)
Albumin: 3 g/dL — ABNORMAL LOW (ref 3.5–5.2)
Alkaline Phosphatase: 96 U/L (ref 39–117)
BUN: 22 mg/dL (ref 6–23)
CO2: 27 meq/L (ref 19–32)
Calcium: 10.1 mg/dL (ref 8.4–10.5)
Chloride: 97 meq/L (ref 96–112)
Creatinine, Ser: 1.11 mg/dL — ABNORMAL HIGH (ref 0.50–1.10)
GFR calc Af Amer: 55 mL/min — ABNORMAL LOW
GFR calc non Af Amer: 47 mL/min — ABNORMAL LOW
Glucose, Bld: 88 mg/dL (ref 70–99)
Potassium: 4.6 meq/L (ref 3.5–5.1)
Sodium: 134 meq/L — ABNORMAL LOW (ref 135–145)
Total Bilirubin: 0.4 mg/dL (ref 0.3–1.2)
Total Protein: 7.5 g/dL (ref 6.0–8.3)

## 2011-04-30 LAB — APTT: aPTT: 34 seconds (ref 24–37)

## 2011-04-30 LAB — CBC
HCT: 42.2 % (ref 36.0–46.0)
Hemoglobin: 14.1 g/dL (ref 12.0–15.0)
MCH: 29 pg (ref 26.0–34.0)
MCHC: 33.4 g/dL (ref 30.0–36.0)
MCV: 86.8 fL (ref 78.0–100.0)
Platelets: 229 10*3/uL (ref 150–400)
RBC: 4.86 MIL/uL (ref 3.87–5.11)
RDW: 14.8 % (ref 11.5–15.5)
WBC: 8.2 10*3/uL (ref 4.0–10.5)

## 2011-04-30 LAB — OCCULT BLOOD, POC DEVICE: Fecal Occult Bld: POSITIVE

## 2011-04-30 LAB — TYPE AND SCREEN
ABO/RH(D): O POS
Antibody Screen: NEGATIVE

## 2011-04-30 LAB — PROTIME-INR
INR: 1.05 (ref 0.00–1.49)
Prothrombin Time: 13.9 s (ref 11.6–15.2)

## 2011-04-30 MED ORDER — PANTOPRAZOLE SODIUM 40 MG PO TBEC
40.0000 mg | DELAYED_RELEASE_TABLET | Freq: Every day | ORAL | Status: DC
  Administered 2011-04-30 – 2011-05-01 (×2): 40 mg via ORAL
  Filled 2011-04-30 (×2): qty 1

## 2011-04-30 MED ORDER — ACETAMINOPHEN 650 MG RE SUPP: 650.0000 mg | Freq: Four times a day (QID) | RECTAL | Status: DC | PRN

## 2011-04-30 MED ORDER — IMIPRAMINE HCL 50 MG PO TABS
50.0000 mg | ORAL_TABLET | Freq: Every day | ORAL | Status: DC
  Administered 2011-04-30: 50 mg via ORAL
  Filled 2011-04-30 (×2): qty 1

## 2011-04-30 MED ORDER — ACETAMINOPHEN 325 MG PO TABS
650.0000 mg | ORAL_TABLET | Freq: Four times a day (QID) | ORAL | Status: DC | PRN
  Administered 2011-04-30 – 2011-05-01 (×2): 650 mg via ORAL
  Filled 2011-04-30 (×2): qty 2

## 2011-04-30 MED ORDER — ACETAMINOPHEN 325 MG PO TABS: 650.0000 mg | ORAL_TABLET | Freq: Once | ORAL | Status: DC

## 2011-04-30 MED ORDER — LORATADINE 10 MG PO TABS
10.0000 mg | ORAL_TABLET | Freq: Every day | ORAL | Status: DC
  Administered 2011-05-01: 10 mg via ORAL
  Filled 2011-04-30: qty 1

## 2011-04-30 MED ORDER — ZOLPIDEM TARTRATE 5 MG PO TABS: 5.0000 mg | ORAL_TABLET | Freq: Every evening | ORAL | Status: DC | PRN

## 2011-04-30 MED ORDER — POTASSIUM CHLORIDE IN NACL 20-0.9 MEQ/L-% IV SOLN
INTRAVENOUS | Status: DC
  Administered 2011-04-30 – 2011-05-01 (×2): via INTRAVENOUS
  Filled 2011-04-30 (×2): qty 1000

## 2011-04-30 MED ORDER — ONDANSETRON HCL 4 MG PO TABS: 4.0000 mg | ORAL_TABLET | Freq: Four times a day (QID) | ORAL | Status: DC | PRN

## 2011-04-30 MED ORDER — ONDANSETRON HCL 4 MG/2ML IJ SOLN: 4.0000 mg | Freq: Four times a day (QID) | INTRAMUSCULAR | Status: DC | PRN

## 2011-04-30 MED ORDER — PREGABALIN 75 MG PO CAPS
75.0000 mg | ORAL_CAPSULE | Freq: Two times a day (BID) | ORAL | Status: DC
  Administered 2011-04-30 – 2011-05-01 (×2): 75 mg via ORAL
  Filled 2011-04-30: qty 3
  Filled 2011-04-30: qty 1

## 2011-04-30 NOTE — ED Notes (Signed)
EDP at bedside  

## 2011-04-30 NOTE — ED Provider Notes (Signed)
3:56 PM  Date: 04/30/2011  Rate: 85  Rhythm: normal sinus rhythm  QRS Axis: left  Intervals: normal  ST/T Wave abnormalities: Repolarization changes of LBBB  Conduction Disutrbances:left bundle branch block  Narrative Interpretation: Abnormal EKG.  Old EKG Reviewed: unchanged    Carleene Cooper III, MD 04/30/11 [phone]

## 2011-04-30 NOTE — ED Provider Notes (Signed)
History     CSN: [id number] Arrival date & time: 04/30/2011  3:14 PM   First MD Initiated Contact with Patient 04/30/11 1520      Chief Complaint  Patient presents with  . Rectal Bleeding    (Consider location/radiation/quality/duration/timing/severity/associated sxs/prior treatment) HPI  Patient states she had diarrhea since last January and today had blood in stool.  Patient had bright red blood in stool this a.m.  She states the diarrhea has consisted of one loose bowel movement diarrhea per week.  Primary care doctor is Dr. Clelia Croft at [facility] called pmd and told to call ems.  No pain, nausea, vomiting.  No history of same.  Patient seen by Dr. Doran Heater for GI for colonoscopy last December 2011.  Patient told to take imodium which she takes one tablet twice a day since June without any change in loose stools.  Patient states she feels sleepy now.  Patient is wheelchair bound since femur fracture  2009.  Patient lives in [place] [facility].  Home health nurse comes in since hospitalization in August.  Nurse in today after patient had bowel movement.    Past Medical History  Diagnosis Date  . Abdominal aneurysm without mention of rupture   . Cholelithiasis   . Hypertension   . Hyperlipidemia   . TIA (transient ischemic attack)   . Neuropathic pain   . Osteoporosis   . Arthritis   reviewed  Past Surgical History  Procedure Date  . Carotid endarterectomy     bilateral (Dr. Edilia Bo)  . Nephrectomy     left  . Colon surgery     Family History  Problem Relation Age of Onset  . Diabetes Mother   . Heart disease Mother   . Hypertension Mother   . Hyperlipidemia Mother   . Aneurysm Father   . Diabetes Brother     History  Substance Use Topics  . Smoking status: Current Everyday Smoker -- 0.5 packs/day    Types: Cigarettes  . Smokeless tobacco: Not on file  . Alcohol Use: No    OB History    Grav Para Term Preterm Abortions TAB SAB Ect  Mult Living                 reviewed Review of Systems  Respiratory: Positive for cough. Negative for shortness of breath.   Cardiovascular: Negative for chest pain and leg swelling.  Genitourinary: Negative for difficulty urinating.  All other systems reviewed and are negative.    Allergies  Codeine and Penicillins  Home Medications   Current Outpatient Rx  Name Route Sig Dispense Refill  . ASPIRIN 325 MG PO TABS Oral Take 325 mg by mouth daily.      . ATORVASTATIN CALCIUM 20 MG PO TABS Oral Take 20 mg by mouth every evening.      Marland Kitchen CETIRIZINE HCL 10 MG PO TABS Oral Take 10 mg by mouth daily.      Marland Kitchen VITAMIN D 2000 UNITS PO TABS Oral Take 2,000 Units by mouth daily.      Marland Kitchen EZETIMIBE 10 MG PO TABS Oral Take 10 mg by mouth daily.      . OMEGA-3 FATTY ACIDS 1000 MG PO CAPS Oral Take 1.2 g by mouth 2 (two) times daily.      . IMIPRAMINE HCL 25 MG PO TABS Oral Take 25 mg by mouth at bedtime.      Marland Kitchen PREGABALIN 75 MG PO CAPS Oral Take 75 mg by  mouth 2 (two) times daily.        BP 134/71  Pulse 85  Temp(Src) 98.1 F (36.7 C) (Oral)  Resp 16  SpO2 96%  Physical Exam  Nursing note and vitals reviewed. Constitutional: She is oriented to person, place, and time. She appears well-developed and well-nourished.  HENT:  Head: Normocephalic and atraumatic.       Arcus senilis  Eyes: Conjunctivae and EOM are normal. Pupils are equal, round, and reactive to light.  Neck: Normal range of motion. Neck supple.       Left carotid endarterectomy scar  Cardiovascular: Normal rate, regular rhythm and normal heart sounds.  Gallop: abrasion and contusion left lower extremity.   Pulmonary/Chest: Effort normal and breath sounds normal.  Abdominal: Soft. Bowel sounds are normal.  Musculoskeletal: Normal range of motion.  Neurological: She is alert and oriented to person, place, and time. She has normal reflexes.  Skin: Skin is warm and dry.  Psychiatric: She has a normal mood and affect.     ED Course  Procedures (including critical care time)  Labs Reviewed - No data to display No results found.   No diagnosis found.    MDM   Date: 04/30/2011  Rate: 85  Rhythm: normal sinus rhythm  QRS Axis: normal  Intervals: normal  ST/T Wave abnormalities: nonspecific ST/T changes  Conduction Disutrbances:left bundle branch block  Narrative Interpretation:   Old EKG Reviewed: unchanged          Hilario Quarry, MD 05/01/11 1510

## 2011-04-30 NOTE — ED Notes (Signed)
EMS-home health nurse noted bright red blood, small amount, in pts stool this afternoon, pt with no complaints

## 2011-04-30 NOTE — ED Notes (Signed)
Pt reports small amount of bright red blood in stool this afternoon. Denies any abdominal pain. No tenderness with palpation to abdomen. Lives with son but he is out of town most days. Pt uses wheelchair to help get around house.

## 2011-04-30 NOTE — H&P (Signed)
PCP:  Dr  Cam Hai Gastroenterologist- Dr Evette Cristal.   Chief Complaint:  Rectal bleeding and diarrhea.  HPI: [name] is a 77 year old female who has multiple comorbids, including PVD/AAA/chronic diarrhea being followed by Dr Evette Cristal, last colonoscopy in December 2011 per patient- cause of chronic diarrhea not known to patient, who comes in today with painless bloody diarrhea, 1 episode around noon today. She says her care giver found some blood in her diaper. Patient sates that she has had diarrhea on a weekly basis but this time it was different in that she was somewhat stool incontinent. She denies fever, hematemesis or melena. She admits to some dizziness but says this is not knew for her. She says she has lost about 32 pounds since her diarrhea started more than a year ago. She denies recent antibiotic usage or travel She takes ASA 81mg  daily. Denies other NSAIDs. In ED she was found to be hemodynamically stable, with a hemoglobin of 14 mg/dl. She has been referred to the hospitalist service for further management. I have talked to Dr Matthias Hughs on call for [facility], who will graciously arrange for [facility] to see in am, or overnight if necessary.  Review of Systems:  The patient denies anorexia, fever, weight loss,, vision loss, decreased hearing, hoarseness, chest pain, syncope, dyspnea on exertion, peripheral edema, balance deficits, hemoptysis, abdominal pain, melena, hematochezia, severe indigestion/heartburn, hematuria, incontinence, genital sores, muscle weakness, suspicious skin lesions, transient blindness, difficulty walking, depression, unusual weight change, abnormal bleeding, enlarged lymph nodes, angioedema, and breast masses.  Past Medical History: Past Medical History  Diagnosis Date  . Abdominal aneurysm without mention of rupture   . Cholelithiasis   . Hypertension   . Hyperlipidemia   . TIA (transient ischemic attack)   . Neuropathic pain   . Osteoporosis   . Arthritis     Past Surgical History  Procedure Date  . Carotid endarterectomy     bilateral (Dr. Edilia Bo)  . Nephrectomy     left  . Colon surgery     Medications: Prior to Admission medications   Medication Sig Start Date End Date Taking? Authorizing Provider  aspirin 81 MG tablet Take 81 mg by mouth daily.     Yes Historical Provider, MD  atorvastatin (LIPITOR) 20 MG tablet Take 20 mg by mouth every evening.     Yes Historical Provider, MD  cetirizine (ZYRTEC ALLERGY) 10 MG tablet Take 10 mg by mouth daily.     Yes Historical Provider, MD  Cholecalciferol (VITAMIN D) 2000 UNITS tablet Take 2,000 Units by mouth daily.     Yes Historical Provider, MD  ezetimibe (ZETIA) 10 MG tablet Take 10 mg by mouth daily.     Yes Historical Provider, MD  fish oil-omega-3 fatty acids 1000 MG capsule Take 1.2 g by mouth 2 (two) times daily.     Yes Historical Provider, MD  imipramine (TOFRANIL) 50 MG tablet Take 50 mg by mouth at bedtime.     Yes Historical Provider, MD  Loperamide HCl (IMODIUM PO) Take 1 tablet by mouth 2 (two) times daily.     Yes Historical Provider, MD  pregabalin (LYRICA) 75 MG capsule Take 75 mg by mouth 2 (two) times daily.     Yes Historical Provider, MD    Allergies:   Allergies  Allergen Reactions  . Codeine Rash  . Penicillins Rash    Social History:  reports that she has been smoking Cigarettes.  She has been smoking about .5  packs per day. She does not have any smokeless tobacco history on file. She reports that she does not drink alcohol. Her drug history not on file.  Family History: Family History  Problem Relation Age of Onset  . Diabetes Mother   . Heart disease Mother   . Hypertension Mother   . Hyperlipidemia Mother   . Aneurysm Father   . Diabetes Brother     Physical Exam: Filed Vitals:   04/30/11 1730 04/30/11 1830 04/30/11 1840 04/30/11 1900  BP: 161/71 132/78 132/78 123/78  Pulse: 86 94 94 96  Temp:      TempSrc:      Resp: 15 17 15 14   SpO2: 92%  94% 94% 92%   General appearance: alert, tearful. Head: Normocephalic, without obvious abnormality, atraumatic Eyes: conjunctivae/corneas clear. PERRL, EOM's intact. Fundi benign. Throat: lips, mucosa, and tongue normal; teeth and gums normal Neck: no adenopathy, no carotid bruit, no JVD, supple, symmetrical, trachea midline and thyroid not enlarged, symmetric, no tenderness/mass/nodules Resp: clear to auscultation bilaterally Cardio: regular rate and rhythm, S1, S2 normal, no murmur, click, rub or gallop GI: soft, non-tender; bowel sounds normal; no masses,  no organomegaly Extremities: extremities normal, atraumatic, no cyanosis or edema Pulses: 2+ and symmetric Neurologic: Alert and oriented X 3, normal strength and tone. Normal symmetric reflexes.    Labs on Admission:   [facility] 04/30/11 1619  NA 134*  K 4.6  CL 97  CO2 27  GLUCOSE 88  BUN 22  CREATININE 1.11*  CALCIUM 10.1  MG --  PHOS --    Basename 04/30/11 1619  AST 14  ALT 7  ALKPHOS 96  BILITOT 0.4  PROT 7.5  ALBUMIN 3.0*   No results found for this basename: LIPASE:2,AMYLASE:2 in the last 72 hours  Basename 04/30/11 1619  WBC 8.2  NEUTROABS 5.0  HGB 14.1  HCT 42.2  MCV 86.8  PLT 229   No results found for this basename: CKTOTAL:3,CKMB:3,CKMBINDEX:3,TROPONINI:3 in the last 72 hours No results found for this basename: TSH,T4TOTAL,FREET3,T3FREE,THYROIDAB in the last 72 hours No results found for this basename: VITAMINB12:2,FOLATE:2,FERRITIN:2,TIBC:2,IRON:2,RETICCTPCT:2 in the last 72 hours    Assessment 77 year old female with chronic diarrhea of unclear etiology, peripheral vascular disease who comes in with painless bloody diarrhea. The differentials include ischemic colitis, diverticular bleed, no suggestion of infectious etiology. Patient dehydrated, possibly hemo concentrated, but otherwise hemodynamically stable. Plan 1. Bloody diarrhea- admit to med/surg observation. Stool studies-  culture/O+P/Sed rate.  IVF, Monitor h/h, transfuse as necessary. PPI. Hold ASA.  [facility] will see in am; talked to Dr Matthias Hughs. 2. PVD/Hyperlipidermia/AAA- no acute changes, hold chronic meds till gi issues addressed in the acute setting. 3. Depression- patient tearful, says she is overwhelmed with the chronic diarrhea, and now the bleed. Resume her regular meds. 4. DVT prophylaxis- SCDs. 5. Patient's condition closely guarded.  [name] 04/30/2011, 8:29 PM

## 2011-05-01 DIAGNOSIS — Z8673 Personal history of transient ischemic attack (TIA), and cerebral infarction without residual deficits: Secondary | ICD-10-CM | POA: Insufficient documentation

## 2011-05-01 DIAGNOSIS — G629 Polyneuropathy, unspecified: Secondary | ICD-10-CM | POA: Diagnosis present

## 2011-05-01 DIAGNOSIS — E785 Hyperlipidemia, unspecified: Secondary | ICD-10-CM | POA: Diagnosis present

## 2011-05-01 LAB — CBC
HCT: 40.1 % (ref 36.0–46.0)
HCT: 37.9 % (ref 36.0–46.0)
Hemoglobin: 12.9 g/dL (ref 12.0–15.0)
Hemoglobin: 12 g/dL (ref 12.0–15.0)
MCH: 27.9 pg (ref 26.0–34.0)
MCHC: 32.2 g/dL (ref 30.0–36.0)
MCHC: 31.7 g/dL (ref 30.0–36.0)
MCV: 86.8 fL (ref 78.0–100.0)
MCV: 87.3 fL (ref 78.0–100.0)
WBC: 5.7 10*3/uL (ref 4.0–10.5)

## 2011-05-01 LAB — TSH: TSH: 2.741 u[IU]/mL (ref 0.350–4.500)

## 2011-05-01 LAB — MAGNESIUM: Magnesium: 2 mg/dL (ref 1.5–2.5)

## 2011-05-01 LAB — COMPREHENSIVE METABOLIC PANEL
Alkaline Phosphatase: 69 U/L (ref 39–117)
BUN: 23 mg/dL (ref 6–23)
Chloride: 100 mEq/L (ref 96–112)
Creatinine, Ser: 1.5 mg/dL — ABNORMAL HIGH (ref 0.50–1.10)
GFR calc Af Amer: 38 mL/min — ABNORMAL LOW (ref >90)
GFR calc non Af Amer: 33 mL/min — ABNORMAL LOW (ref >90)
Glucose, Bld: 86 mg/dL (ref 70–99)
Potassium: 3.9 mEq/L (ref 3.5–5.1)
Total Bilirubin: 0.4 mg/dL (ref 0.3–1.2)

## 2011-05-01 LAB — CEA: CEA: 0.9 ng/mL (ref 0.0–5.0)

## 2011-05-01 LAB — PHOSPHORUS: Phosphorus: 4.5 mg/dL (ref 2.3–4.6)

## 2011-05-01 MED ORDER — VITAMIN D 1000 UNITS PO TABS
2000.0000 [IU] | ORAL_TABLET | Freq: Every day | ORAL | Status: DC
  Administered 2011-05-01: 2000 [IU] via ORAL
  Filled 2011-05-01: qty 2

## 2011-05-01 MED ORDER — SIMVASTATIN 20 MG PO TABS
20.0000 mg | ORAL_TABLET | Freq: Every day | ORAL | Status: DC
  Filled 2011-05-01: qty 1

## 2011-05-01 MED ORDER — EZETIMIBE 10 MG PO TABS
10.0000 mg | ORAL_TABLET | Freq: Every day | ORAL | Status: DC
Start: 2011-05-01 — End: 2011-05-01
  Administered 2011-05-01: 10 mg via ORAL
  Filled 2011-05-01: qty 1

## 2011-05-01 MED ORDER — ASPIRIN 81 MG PO TABS: 81.0000 mg | ORAL_TABLET | Freq: Every day | ORAL | Status: DC

## 2011-05-01 NOTE — Progress Notes (Signed)
05/01/2011 [name] SPARKS Case Management Note [phone]       Utilization review completed.  

## 2011-05-01 NOTE — Discharge Summary (Signed)
PATIENT DETAILS Name: [name] Age: 77 y.o. Sex: female Date of Birth: Mar 27, 1934 MRN: [id number]. Admit Date: 04/30/2011 Admitting Physician: Jeoffrey Massed WUJ:[id number] Clelia Croft  PRIMARY DISCHARGE DIAGNOSIS:  Principal Problem:  *Rectal bleeding-likely secondary to hemorrhoids  Active Problems:  Diarrhea-chronic  Dehydration  Dyslipidemia  Neuropathy  h/o TIA    PAST MEDICAL HISTORY: Past Medical History  Diagnosis Date  . Abdominal aneurysm without mention of rupture   . Cholelithiasis   . Hyperlipidemia   . TIA (transient ischemic attack) ~ 09/2010    "[name]"  . Neuropathic pain   . Osteoporosis   . Arthritis   . Hypertension 04/30/11    "not a problem now"  . Pneumonia     "once or twice"  . Migraines   . Headache   . Peripheral neuropathy     "hands, feet, legs"    DISCHARGE MEDICATIONS: Current Discharge Medication List    CONTINUE these medications which have CHANGED   Details  aspirin 81 MG tablet Take 1 tablet (81 mg total) by mouth daily.      CONTINUE these medications which have NOT CHANGED   Details  atorvastatin (LIPITOR) 20 MG tablet Take 20 mg by mouth every evening.      cetirizine (ZYRTEC ALLERGY) 10 MG tablet Take 10 mg by mouth daily.      Cholecalciferol (VITAMIN D) 2000 UNITS tablet Take 2,000 Units by mouth daily.      ezetimibe (ZETIA) 10 MG tablet Take 10 mg by mouth daily.      imipramine (TOFRANIL) 50 MG tablet Take 50 mg by mouth at bedtime.      pregabalin (LYRICA) 75 MG capsule Take 75 mg by mouth 2 (two) times daily.        STOP taking these medications     fish oil-omega-3 fatty acids 1000 MG capsule      Loperamide HCl (IMODIUM PO)         BRIEF HPI:  See H&P, Labs, Consult and Test reports for all details in brief, patient was admitted for hematochezia. Please see dictated H&P for details.  CONSULTATIONS:   GI-Dr Bosie Clos  PERTINENT RADIOLOGIC STUDIES: No results  found.  PERTINENT LAB RESULTS: CBC:  Basename 05/01/11 0525 05/01/11 0026  WBC 5.7 6.8  HGB 12.0 12.9  HCT 37.9 40.1  PLT 209 240   CMET CMP     Component Value Date/Time   NA 136 05/01/2011 0525   K 3.9 05/01/2011 0525   CL 100 05/01/2011 0525   CO2 28 05/01/2011 0525   GLUCOSE 86 05/01/2011 0525   BUN 23 05/01/2011 0525   CREATININE 1.50* 05/01/2011 0525   CALCIUM 8.9 05/01/2011 0525   PROT 6.0 05/01/2011 0525   ALBUMIN 2.5* 05/01/2011 0525   AST 11 05/01/2011 0525   ALT 5 05/01/2011 0525   ALKPHOS 69 05/01/2011 0525   BILITOT 0.4 05/01/2011 0525   GFRNONAA 33* 05/01/2011 0525   GFRAA 38* 05/01/2011 0525    GFR The CrCl is unknown because both a height and weight (above a minimum accepted value) are required for this calculation.  Basename 05/01/11 0525  TSH 2.741  T4TOTAL --  T3FREE --  THYROIDAB --   No results found for this basename: VITAMINB12:2,FOLATE:2,FERRITIN:2,TIBC:2,IRON:2,RETICCTPCT:2 in the last 72 hours Coags:  Basename 05/01/11 0525 04/30/11 1619  INR 1.14 1.05   Microbiology: No results found for this or any previous visit (from the past 240 hour(s)).   BRIEF  HOSPITAL COURSE:   Principal Problem:  *Rectal bleeding: - this is likely secondary to hemorrhoids -per Dr Bosie Clos, last Colonoscopy was in April of this year, showed  no significant abnormalities, biopsies was also negative - Per Dr Bosie Clos, no further inpatient work up required. Hematochezia has also stopped.Hemoglobin stable. -will d/c home today, patient to f/u with Dr Evette Cristal in 1 week.  Active Problems:  Diarrhea - question of overflow fecal incontinence, GI advising patient to cut back on Imodium. - has had a recent colonoscopy with biopsies that have been negative. -this is a chronic issue, patient to follow up with Dr Evette Cristal as outpatient.   Dehydration -resolved with IV Fluids, clinically now euvolemic.   Dyslipidemia -resume statins at discharge.    Neuropathy -stable, resume imipramine and lyrica   TODAY-DAY OF DISCHARGE:  Subjective:   [name] today has no headache,no chest abdominal pain,no new weakness tingling or numbness, feels much better wants to go home today.Her rectal bleeding has resolved.   Objective:   Blood pressure 86/50, pulse 56, temperature 98.2 F (36.8 C), temperature source Oral, resp. rate 18, weight 66 kg (145 lb 8.1 oz), SpO2 95.00%.  Intake/Output Summary (Last 24 hours) at 05/01/11 1631 Last data filed at 05/01/11 1600  Gross per 24 hour  Intake    590 ml  Output    400 ml  Net    190 ml    Exam Awake Alert, Oriented *3, No new F.N deficits, Normal affect [place].AT,PERRAL Supple Neck,No JVD, No cervical lymphadenopathy appriciated.  Symmetrical Chest wall movement, Good air movement bilaterally, CTAB RRR,No Gallops,Rubs or new Murmurs, No Parasternal Heave +ve B.Sounds, Abd Soft, Non tender, No organomegaly appriciated, No rebound -guarding or rigidity. No Cyanosis, Clubbing or edema, No new Rash or bruise  DISPOSITION:  Home   DISCHARGE INSTRUCTIONS:    Follow-up Information    Follow up with [facility]. Make an appointment in 1 week.   Contact information:   [address] [place] [address] [phone]       Follow up with [facility] Make an appointment in 1 week.   Contact information:   [address] [place] [address] [phone]         Total Time spent on discharge equals 45 minutes.  SignedJeoffrey Massed 05/01/2011 4:31 PM

## 2011-05-01 NOTE — Progress Notes (Signed)
Spoke with patient about HHC. She stated that she has HHC with [facility] in [place]. Contacted [facility] in [place] and they are seeing patient for [facility] and HHPT. Spoke with Dr. Jerral Ralph, will resume HHRN and HHPT. I will fax copy of order and d/c summary to [facility] [phone].

## 2011-05-01 NOTE — Consult Note (Addendum)
Referring Provider: No ref. provider found Primary Care Physician:  No primary provider on file. Primary Gastroenterologist:  Dr. Evette Cristal  Reason for Consultation:  Diarrhea, rectal bleeding  HPI: [name] is a 77 y.o. female reports having chronic diarrhea since January 2012. States she will go 5 days without having any stools and then have 2 days of watery stool with small amount of form in it. During the 5 days of no stools she will have an urge to go but nothing will come out. During the 2 days of loose stools she'll have fecal incontinence with it. Yesterday she had red blood in her bed from an incontinent stool. She reports that her stool does float frequently. Denies any associated abdominal pain, nausea, or vomiting except for some mild cramping during her diarrhea spells. Denies any food triggers to her diarrhea. Denies any nocturnal stools. Denies any recent antibiotics. She does report dizziness but reports this has been going on for years and denies any worsening of that. Her son states that she's had chronic constipation up until January when she switched into diarrhea. Prior to January she was taking Colace and then switched to a laxative with a stool softener shortly before starting to have the diarrhea. She had a colonoscopy in April 2012 by Dr. Evette Cristal for her diarrhea, which were normal with negative colonic biopsies. X-ray during her admission showed moderate colonic stool burden without any obstruction. She has not had any further bleeding since her episode yesterday. Denies any rectal bleeding prior to yesterday's episode. Since January her normal cycle is having 2 days of profuse watery to loose stools and then 5 days of no stools at all. She's been taking Imodium twice a day every day since January.   Past Medical History  Diagnosis Date  . Abdominal aneurysm without mention of rupture   . Cholelithiasis   . Hyperlipidemia   . TIA (transient ischemic attack) ~ 09/2010   "[name]"  . Neuropathic pain   . Osteoporosis   . Arthritis   . Hypertension 04/30/11    "not a problem now"  . Pneumonia     "once or twice"  . Migraines   . Headache   . Peripheral neuropathy     "hands, feet, legs"    Past Surgical History  Procedure Date  . Carotid endarterectomy 03/1999; 04/1999    bilateral (Dr. Edilia Bo)  . 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 Tonsillectomy     "when I was little"  . Abdominal hysterectomy 1984  . Tubal ligation 1975  . Dilation and curettage of uterus     "I've had a bunch of those"  . Colon surgery 2002    "8 inches of my colon taken out"  . Fracture surgery 02/2008    "femur; put rod & screws in"  . Fracture surgery 03/2008    "replaced screws in femur"  . Fracture surgery 04/2008    "replaced hardware S/P rebreak femur"    Prior to Admission medications   Medication Sig Start Date End Date Taking? Authorizing Provider  aspirin 81 MG tablet Take 81 mg by mouth daily.     Yes Historical Provider, MD  atorvastatin (LIPITOR) 20 MG tablet Take 20 mg by mouth every evening.     Yes Historical Provider, MD  cetirizine (ZYRTEC ALLERGY) 10 MG tablet Take 10 mg by mouth daily.     Yes Historical Provider, MD  Cholecalciferol (VITAMIN  D) 2000 UNITS tablet Take 2,000 Units by mouth daily.     Yes Historical Provider, MD  ezetimibe (ZETIA) 10 MG tablet Take 10 mg by mouth daily.     Yes Historical Provider, MD  imipramine (TOFRANIL) 50 MG tablet Take 50 mg by mouth at bedtime.     Yes Historical Provider, MD  Loperamide HCl (IMODIUM PO) Take 1 tablet by mouth 2 (two) times daily.     Yes Historical Provider, MD  pregabalin (LYRICA) 75 MG capsule Take 75 mg by mouth 2 (two) times daily.     Yes Historical Provider, MD    Current Facility-Administered Medications  Medication Dose Route Frequency Provider Last Rate Last Dose  . 0.9 % NaCl with KCl 20 mEq/ L  infusion   Intravenous Continuous Shanker  Ghimire 20 mL/hr at 05/01/11 1033    . acetaminophen (TYLENOL) tablet 650 mg  650 mg Oral Q6H PRN Simbiso Ranga   650 mg at 05/01/11 1024   Or  . acetaminophen (TYLENOL) suppository 650 mg  650 mg Rectal Q6H PRN Simbiso Ranga      . cholecalciferol (VITAMIN D) tablet 2,000 Units  2,000 Units Oral Daily Shanker Ghimire   2,000 Units at 05/01/11 1543  . ezetimibe (ZETIA) tablet 10 mg  10 mg Oral Daily Shanker Ghimire   10 mg at 05/01/11 1543  . imipramine (TOFRANIL) tablet 50 mg  50 mg Oral QHS Simbiso Ranga   50 mg at 04/30/11 2327  . loratadine (CLARITIN) tablet 10 mg  10 mg Oral Daily Simbiso Ranga   10 mg at 05/01/11 1025  . ondansetron (ZOFRAN) tablet 4 mg  4 mg Oral Q6H PRN Simbiso Ranga       Or  . ondansetron (ZOFRAN) injection 4 mg  4 mg Intravenous Q6H PRN Simbiso Ranga      . pantoprazole (PROTONIX) EC tablet 40 mg  40 mg Oral Q1200 Simbiso Ranga   40 mg at 05/01/11 1213  . pregabalin (LYRICA) capsule 75 mg  75 mg Oral BID Simbiso Ranga   75 mg at 05/01/11 1025  . simvastatin (ZOCOR) tablet 20 mg  20 mg Oral q1800 Shanker Ghimire      . zolpidem (AMBIEN) tablet 5 mg  5 mg Oral QHS PRN Simbiso Ranga      . DISCONTD: acetaminophen (TYLENOL) tablet 650 mg  650 mg Oral Once Hilario Quarry, MD        Allergies as of 04/30/2011 - Review Complete 04/30/2011  Allergen Reaction Noted  . Codeine Rash 09/26/2010  . Penicillins Rash 09/26/2010    Family History  Problem Relation Age of Onset  . Diabetes Mother   . Heart disease Mother   . Hypertension Mother   . Hyperlipidemia Mother   . Aneurysm Father   . Diabetes Brother     History   Social History  . Marital Status: Single    Spouse Name: N/A    Number of Children: N/A  . Years of Education: N/A   Occupational History  . Not on file.   Social History Main Topics  . Smoking status: Current Everyday Smoker -- 0.2 packs/day for 60 years    Types: Cigarettes  . Smokeless tobacco: Never Used  . Alcohol Use: No  . Drug  Use: No     "I'm trying hard to quit smoking cigarettes"  . Sexually Active: Not on file   Other Topics Concern  . Not on file   Social History Narrative  .  No narrative on file    Review of Systems: Positive for: see HPI All negative except as stated above in HPI.  Physical Exam: Vital signs in last 24 hours: Temp:  [98.2 F (36.8 C)-98.6 F (37 C)] 98.2 F (36.8 C) (11/15 1300) Pulse Rate:  [56-96] 56  (11/15 1300) Resp:  [14-18] 18  (11/15 1300) BP: (86-161)/(41-78) 86/50 mmHg (11/15 1300) SpO2:  [89 %-95 %] 95 % (11/15 1300) Weight:  [66 kg (145 lb 8.1 oz)] 145 lb 8.1 oz (66 kg) (11/14 2112) Last BM Date: 05/01/11 General:   Alert,  Well-developed, well-nourished, pleasant and cooperative in NAD Lungs:  Clear throughout to auscultation.   No wheezes, crackles, or rhonchi. No acute distress. Heart:  Regular rate and rhythm; no murmurs, clicks, rubs,  or gallops. Abdomen: Soft nontender nondistended active bowel sounds  Rectal:  Deferred  GI:  Lab Results:  Basename 05/01/11 0525 05/01/11 0026 04/30/11 1619  WBC 5.7 6.8 8.2  HGB 12.0 12.9 14.1  HCT 37.9 40.1 42.2  PLT 209 240 229   BMET  Basename 05/01/11 0525 04/30/11 1619  NA 136 134*  K 3.9 4.6  CL 100 97  CO2 28 27  GLUCOSE 86 88  BUN 23 22  CREATININE 1.50* 1.11*  CALCIUM 8.9 10.1   LFT  Basename 05/01/11 0525  PROT 6.0  ALBUMIN 2.5*  AST 11  ALT 5  ALKPHOS 69  BILITOT 0.4  BILIDIR --  IBILI --   PT/INR  Basename 05/01/11 0525 04/30/11 1619  LABPROT 14.8 13.9  INR 1.14 1.05     Studies/Results: No results found.  Impression: 77 year old white female with diarrhea that occurs 2 days out of every 7 days followed by 5 days of no stools at all. I think her diarrhea is due to overflow incontinence of her stool. She could be having a recurring cycle of stool building up in the colon followed by part of the stool [place] burden breaking off in cause in the loose stools and she has.  Microscopic colitis has been ruled out and her symptoms are not typical for that process. Her history is not typical for pancreatic insufficiency or malabsorptive process due to the fact she is not having daily diarrhea but more of a cyclical pattern. Doubt she has infection based on chronicity of her problems. I do not think she needs another colonoscopy at this time. Rectal bleeding episode yesterday likely due to hemorrhoids and no further bleeding since that one episode.  Plan: Supportive care. Would avoid Imodium since this is likely exacerbating her symptoms. Follow Dr. Evette Cristal in 2-3 weeks. No other inpatient workup is necessary. D/W Dr. Jerral Ralph.   LOS: 1 day   [name] Holstrom C.  05/01/2011, 3:55 PM

## 2011-05-01 NOTE — Progress Notes (Addendum)
PATIENT DETAILS Name: [name] Age: 77 y.o. Sex: female Date of Birth: [birth year] Admit Date: 04/30/2011 PCP:No primary provider on file.   Subjective: Claims hematochezia has resolved this morning. Has chronic diarrhea for over a year. Sees [name] as an outpatient.  Objective: Vital signs in last 24 hours: Patient Vitals for the past 24 hrs:  BP Temp Temp src Pulse Resp SpO2 Weight  05/01/11 0525 98/41 mmHg 98.3 F (36.8 C) - 62  18  93 % -  04/30/11 2112 116/58 mmHg 98.6 F (37 C) Oral 87  18  92 % 66 kg (145 lb 8.1 oz)  04/30/11 1900 123/78 mmHg - - 96  14  92 % -  04/30/11 1840 132/78 mmHg - - 94  15  94 % -  04/30/11 1830 132/78 mmHg - - 94  17  94 % -  04/30/11 1730 161/71 mmHg - - 86  15  92 % -  04/30/11 1652 151/65 mmHg - - 88  16  95 % -  04/30/11 1630 151/65 mmHg - - 86  14  89 % -  04/30/11 1530 135/80 mmHg - - - 14  - -  04/30/11 1516 134/71 mmHg 98.1 F (36.7 C) Oral 85  16  96 % -   Weight change:  There is no height on file to calculate BMI.  Intake/Output from previous day:  Intake/Output Summary (Last 24 hours) at 05/01/11 1358 Last data filed at 05/01/11 0600  Gross per 24 hour  Intake      0 ml  Output    400 ml  Net   -400 ml     PHYSICAL EXAM: Gen Exam: Awake and alert with clear speech.   Neck: Supple, No JVD.   Chest: B/L Clear.   CVS: S1 S2 Regular, no murmurs.  Abdomen: soft, BS +, non tender, non distended.  Extremities: no edema, warm.   Neurologic: Non Focal.   Skin: No Rash.   Wounds: N/A.    CONSULTS: Pending [facility] consultation  LAB RESULTS:  Basename 05/01/11 0525 05/01/11 0026  WBC 5.7 6.8  HGB 12.0 12.9  HCT 37.9 40.1  PLT 209 240   CMET CMP     Component Value Date/Time   NA 136 05/01/2011 0525   K 3.9 05/01/2011 0525   CL 100 05/01/2011 0525   CO2 28 05/01/2011 0525   GLUCOSE 86 05/01/2011 0525   BUN 23 05/01/2011 0525   CREATININE 1.50* 05/01/2011 0525   CALCIUM 8.9 05/01/2011 0525   PROT 6.0  05/01/2011 0525   ALBUMIN 2.5* 05/01/2011 0525   AST 11 05/01/2011 0525   ALT 5 05/01/2011 0525   ALKPHOS 69 05/01/2011 0525   BILITOT 0.4 05/01/2011 0525   GFRNONAA 33* 05/01/2011 0525   GFRAA 38* 05/01/2011 0525     Basename 05/01/11 0525  TSH 2.741  T4TOTAL --  T3FREE --  THYROIDAB --   No results found for this basename: VITAMINB12:2,FOLATE:2,FERRITIN:2,TIBC:2,IRON:2,RETICCTPCT:2 in the last 72 hours COAGS  Basename 05/01/11 0525 04/30/11 1619  INR 1.14 1.05   MICROBIOLOGY: No results found for this or any previous visit (from the past 240 hour(s)).  RADIOLOGY STUDIES/RESULTS: No results found.  MEDICATIONS: Scheduled Meds:   . ezetimibe  10 mg Oral Daily  . imipramine  50 mg Oral QHS  . loratadine  10 mg Oral Daily  . pantoprazole  40 mg Oral Q1200  . pregabalin  75 mg Oral BID  . simvastatin  20 mg Oral Daily  . Vitamin D  2,000 Units Oral Daily  . DISCONTD: acetaminophen  650 mg Oral Once   Continuous Infusions:   . 0.9 % NaCl with KCl 20 mEq / L 20 mL/hr at 05/01/11 1033   PRN Meds:.acetaminophen, acetaminophen, ondansetron (ZOFRAN) IV, ondansetron, zolpidem  Antibiotics: Anti-infectives    None      Assessment/Plan: Patient Active Hospital Problem List: Rectal bleeding (04/30/2011)   Assessment:painless hematochezia, etiology not known, patient claims no further episodes since this morning.   Plan: Await GI evaluation. Decrease IV fluids and advance diet. Follow clinical course. Slight decrease in hemoglobin, however I believe this is secondary to her IV fluids.  Diarrhea (04/30/2011)   Assessment: Stable, this is a chronic issue for the patient.    Plan: Regarding this patient will follow with her primary gastroenterologist as an outpatient.   Dehydration (04/30/2011)   Assessment: Resolved, seems euvolemic to me.    Plan: Decrease IV fluids, advance diet.   Dyslipidemia (05/01/2011)   Assessment: Stable    Plan: Resume statin    Neuropathy (05/01/2011)   Assessment: Stable    Plan: Resume Lyrica and imipramine.  Disposition -Remain inpatient until seen by GI, if no further hematochezia and no further workup required possible discharge later today.  [name] Bees, MD. 05/01/2011, 1:58 PM   Addendum:  -Spoke with patient's son(over phone), [name]-he did convey to me that Dr [name] told him that patient needed to stay in the hospital, I did tell him that I had spoken with Dr [name] earlier today, and Dr [name] had told me that he was not planning to do any inpatient w/u as patient just had a colonoscopy done last April. I again spoke with Dr [name] over the phone, and Dr [name] did acknowledge telling the patient's son that patient needed to stay in the hospital, however Dr [name] then reveiwed patient's data and found that patient just had a colonoscopy last April and then called this MD saying patient was OK to go home. I then called patient's son, however I could not reach him, and left a message.

## 2011-05-01 NOTE — Progress Notes (Signed)
Chaplain Note:  Chaplain visited with pt and provided spiritual comfort and support.  Pt was resting in bed and welcomed the chaplain.  The pt is looking forward to being discharged this afternoon.  Chaplain prayed with pt before leaving.   Pt expressed no other concerns.  No follow up required.  Verdie Shire, chaplain resident [phone])  05/01/11 1349  Clinical Encounter Type  Visited With Patient  Visit Type Initial;Spiritual support  Referral From Nurse  Spiritual Encounters  Spiritual Needs Prayer  Stress Factors  Patient Stress Factors Other (Comment) (Dealing with aging and the health issues that come with it.)  Family Stress Factors None identified

## 2011-06-05 ENCOUNTER — Encounter: Payer: Self-pay | Admitting: Vascular Surgery

## 2011-06-06 ENCOUNTER — Encounter: Payer: Self-pay | Admitting: Vascular Surgery

## 2011-06-06 ENCOUNTER — Ambulatory Visit (INDEPENDENT_AMBULATORY_CARE_PROVIDER_SITE_OTHER): Payer: Medicare Other | Admitting: *Deleted

## 2011-06-06 ENCOUNTER — Ambulatory Visit (INDEPENDENT_AMBULATORY_CARE_PROVIDER_SITE_OTHER): Payer: Medicare Other | Admitting: Vascular Surgery

## 2011-06-06 ENCOUNTER — Other Ambulatory Visit (INDEPENDENT_AMBULATORY_CARE_PROVIDER_SITE_OTHER): Payer: Medicare Other | Admitting: *Deleted

## 2011-06-06 VITALS — BP 114/75 | HR 65 | Resp 16 | Ht 64.0 in | Wt 160.0 lb

## 2011-06-06 DIAGNOSIS — I6529 Occlusion and stenosis of unspecified carotid artery: Secondary | ICD-10-CM | POA: Insufficient documentation

## 2011-06-06 DIAGNOSIS — I714 Abdominal aortic aneurysm, without rupture: Secondary | ICD-10-CM

## 2011-06-06 DIAGNOSIS — Z48812 Encounter for surgical aftercare following surgery on the circulatory system: Secondary | ICD-10-CM

## 2011-06-06 DIAGNOSIS — I6523 Occlusion and stenosis of bilateral carotid arteries: Secondary | ICD-10-CM

## 2011-06-06 NOTE — Progress Notes (Signed)
[facility] [place]  Established Carotid Patient  History of Present Illness  [name] is a 77 y.o. 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  By report, the patient believes Dr. Edilia Bo previously did her surgeries.  Pt had an acute stroke in the hospital in August and was seen by Dr. Hart Rochester.  Unfortunately, the patient continued to fail to follow up as scheduled.  Patient has known history of TIA or stroke symptoms but she can't remember which side.  The patient denies amaurosis fugax or monocular blindness.  The patient denies never had facial drooping or hemiplegia.  The patient has never had receptive or expressive aphasia.  The patient thinks her previous neurologic deficits have resolved.  She does not ambulate since her femoral fx.  Additionally, the patient has a known AAA.  She denies any abdominal or back pain.  Past Medical History  Diagnosis Date  . Abdominal aneurysm without mention of rupture   . Cholelithiasis   . Hyperlipidemia   . TIA (transient ischemic attack) ~ 09/2010    "[name]"  . Neuropathic pain   . Osteoporosis   . Arthritis   . Hypertension 04/30/11    "not a problem now"  . Pneumonia     "once or twice"  . Migraines   . Headache   . Peripheral neuropathy     "hands, feet, legs"    Past Surgical History  Procedure Date  . Carotid endarterectomy 03/1999; 04/1999    bilateral (Dr. Edilia Bo)  . 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 Tonsillectomy     "when I was little"  . Abdominal hysterectomy 1984  . Tubal ligation 1975  . Dilation and curettage of uterus     "I've had a bunch of those"  . Colon surgery 2002    "8 inches of my colon taken out"  . Fracture surgery 02/2008    "femur; put rod & screws in"  . Fracture surgery 03/2008    "replaced screws in femur"  . Fracture surgery 04/2008    "replaced hardware S/P rebreak femur"     History   Social History  . Marital Status: Single    Spouse Name: N/A    Number of Children: N/A  . Years of Education: N/A   Occupational History  . Not on file.   Social History Main Topics  . Smoking status: Current Everyday Smoker -- 0.2 packs/day for 60 years    Types: Cigarettes  . Smokeless tobacco: Never Used  . Alcohol Use: No  . Drug Use: No     "I'm trying hard to quit smoking cigarettes"  . Sexually Active: Not on file   Other Topics Concern  . Not on file   Social History Narrative  . No narrative on file    Family History  Problem Relation Age of Onset  . Diabetes Mother   . Heart disease Mother   . Hypertension Mother   . Hyperlipidemia Mother   . Aneurysm Father   . Diabetes Brother     Current Outpatient Prescriptions on File Prior to Visit  Medication Sig Dispense Refill  . aspirin 81 MG tablet Take 1 tablet (81 mg total) by mouth daily.      Marland Kitchen atorvastatin (LIPITOR) 20 MG tablet Take 20 mg by mouth every evening.        . cetirizine (ZYRTEC  ALLERGY) 10 MG tablet Take 10 mg by mouth daily.        . Cholecalciferol (VITAMIN D) 2000 UNITS tablet Take 2,000 Units by mouth daily.        Marland Kitchen ezetimibe (ZETIA) 10 MG tablet Take 10 mg by mouth daily.        Marland Kitchen imipramine (TOFRANIL) 50 MG tablet Take 50 mg by mouth at bedtime.        . pregabalin (LYRICA) 75 MG capsule Take 75 mg by mouth 2 (two) times daily.          Allergies  Allergen Reactions  . Codeine Rash  . Penicillins Rash    Review of Systems (Positive items checked otherwise negative)  General: [ ]  Weight loss, [ ]  Weight gain, [ ]   Loss of appetite, [ ]  Fever  Neurologic: [x]  Dizziness, [ ]  Blackouts, [ ]  Headaches, [ ]  Seizure  Ear/Nose/Throat: [ ]  Change in eyesight, [ ]  Change in hearing, [ ]  Nose bleeds, [ ]  Sore throat  Vascular: [ ]  Pain in legs with walking, [x]  Pain in feet while lying flat, [ ]  Non-healing ulcer, [x]  Stroke, [x]  "Mini stroke", [ ]  Slurred speech, [ ]   Temporary blindness, [ ]  Blood clot in vein, [ ]  Phlebitis  Pulmonary: [ ]  Home oxygen, [ ]  Productive cough, [ ]  Bronchitis, [ ]  Coughing up blood,  [ ]  Asthma, [ ]  Wheezing  Musculoskeletal: [ ]  Arthritis, [ ]  Joint pain, [ ]  Muscle pain  Cardiac: [ ]  Chest pain, [ ]  Chest tightness/pressure, [ ]  Shortness of breath when lying flat, [ ]  Shortness of breath with exertion, [ ]  Palpitations, [ ]  Heart murmur, [ ]  Arrythmia,  [ ]  Atrial fibrillation  Hematologic: [ ]  Bleeding problems, [ ]  Clotting disorder, [ ]  Anemia  Psychiatric:  [ ]  Depression, [ ]  Anxiety, [ ]  Attention deficit disorder  Gastrointestinal:  [ ]  Black stool,[ ]   Blood in stool, [ ]  Peptic ulcer disease, [ ]  Reflux, [ ]  Hiatal hernia, [ ]  Trouble swallowing, [ ]  Diarrhea, [ ]  Constipation  Urinary:  [ ]  Kidney disease, [ ]  Burning with urination, [ ]  Frequent urination, [ ]  Difficulty urinating  Skin: [ ]  Ulcers, [ ]  Rashes   Physical Examination  Filed Vitals:   06/06/11 1132  BP: 114/75  Pulse: 65  Resp: 16  Height: 5\' 4"  (1.626 m)  Weight: 160 lb (72.576 kg)  SpO2: 100%   Body mass index is 27.46 kg/(m^2).  General: A&O x 3, WDWN  Eyes: PERRLA, EOMI  Pulmonary: Sym exp, good air movt, CTAB, no rales, rhonchi, & wheezing  Cardiac: RRR, Nl S1, S2, no Murmurs, rubs or gallops  Vascular: Vessel Right Left  Radial Palpable Palpable  Brachial Palpable Palpable  Carotid Palpable, without bruit Palpable, without bruit  Aorta Non-palpable N/A  Femoral Palpable Palpable  Popliteal Non-palpable Non-palpable  PT Non-Palpable Non-Palpable  DP Non-Palpable Non-Palpable   Gastrointestinal: soft, NTND, -G/R, - HSM, - masses, - CVAT B  Musculoskeletal: M/S 5/5 throughout except 4/5 DF/PF in BLE, Extremities without ischemic changes , Gait not tested  Neurologic: CN 2-12 intact , Pain and light touch intact in extremities , Motor exam as listed above  Non-Invasive Vascular Imaging  CAROTID DUPLEX  (Date: 06/06/11):   R ICA stenosis: 80-99%  R VA: patent and antegrade  L ICA stenosis: 80-99%  L VA: patent and antegrade  AAA Duplex (Date: 06/06/11)  Max size: 4.44 cmx 4.54 cm  Medical  Decision Making  [name] is a 77 y.o. female who presents with: B ICA stenosis in the setting previous B CEA, small asx AAA.   Based on the patient's vascular studies and examination, I have offered the patient: B carotid and cerebral angiogram to evaluate the lesion and aortic arch for possible carotid stenting.  Pt will be scheduled with Dr. Edilia Bo, per her wishes. I discussed with the patient the nature of angiographic procedures, especially the limited patencies of any endovascular intervention.   The patient is aware of that the risks of an angiographic procedure include but are not limited to: bleeding, infection, access site complications, renal failure, embolization, rupture of vessel, dissection, possible need for emergent surgical intervention, possible need for surgical procedures to treat the patient's pathology, and stroke and death.   The patient is aware of the risks and agrees to proceed. She also need preop risk stratification and optimization with her Cardiologist.  I discussed in depth with the patient the nature of atherosclerosis, and emphasized the importance of maximal medical management including strict control of blood pressure, blood glucose, and lipid levels, obtaining regular exercise, anti-platelet agents, and cessation of smoking.  The patient is aware that without maximal medical management the underlying atherosclerotic disease process will progress, limiting the benefit of any interventions.  Thank you for allowing [place] to participate in this patient's care.  Leonides Sake, MD [facility] Office: [phone] Pager: [phone]  06/06/2011, 5:28 PM

## 2011-06-19 ENCOUNTER — Other Ambulatory Visit: Payer: Self-pay

## 2011-06-19 NOTE — Procedures (Unsigned)
DUPLEX ULTRASOUND OF ABDOMINAL AORTA  INDICATION:  AAA.  HISTORY: Diabetes:  No. Cardiac:  No. Hypertension:  Yes. Smoking:  Current. Connective Tissue Disorder: Family History:  Father. Previous Surgery:  No.  DUPLEX EXAM:         AP (cm)                   TRANSVERSE (cm) Proximal             1.87 cm                   1.80 cm Mid                  4.44 cm                   4.54 cm Distal               2.22 cm                   2.13 cm Right Iliac          Not visualized            Not visualized Left Iliac           Not visualized            Not visualized  PREVIOUS:  Date:  AP:  TRANSVERSE:  IMPRESSION: 1. Abdominal aortic aneurysm measuring 4.44 X 4.54 cm. 2. Iliacs were not able to be visualized due to overlying bowel gas.     Patient has been smoking this morning.  ___________________________________________ Fransisco Hertz, MD  EM/MEDQ  D:  06/06/2011  T:  06/06/2011  Job:  [id number]

## 2011-06-19 NOTE — Procedures (Unsigned)
CAROTID DUPLEX EXAM  INDICATION:  Follow up carotid endarterectomies.  HISTORY: Diabetes:  No. Cardiac:  No. Hypertension:  Yes. Smoking:  Current. Previous Surgery:  Bilateral carotid endarterectomies by Dr. Edilia Bo. CV History:  Dizziness, weakness in arms and legs. Amaurosis Fugax No, Paresthesias No, Hemiparesis No.                                      RIGHT             LEFT Brachial systolic pressure: Brachial Doppler waveforms: Vertebral direction of flow:        Antegrade         Antegrade DUPLEX VELOCITIES (cm/sec) CCA peak systolic                   M = 25, D = 450   57 ECA peak systolic                   222               101 ICA peak systolic                   144               306 ICA end diastolic                   65                132 PLAQUE MORPHOLOGY:                  Soft              Heterogenous PLAQUE AMOUNT:                      Severe            Severe PLAQUE LOCATION:                    Distal CCA, bifurcation             ICA  IMPRESSION: 1. Stenosis at the patch site/bifurcation with velocities of 450 cm/s,     suggesting an 80% to 99% stenosis. 2. Right internal carotid artery velocities suggest 40% to 59%     stenosis. 3. Left internal carotid artery velocities suggest an 80% to 99%     stenosis.  ___________________________________________ Fransisco Hertz, MD  EM/MEDQ  D:  06/06/2011  T:  06/06/2011  Job:  [id number]

## 2011-06-23 ENCOUNTER — Encounter (HOSPITAL_COMMUNITY): Payer: Self-pay | Admitting: Pharmacy Technician

## 2011-06-29 MED ORDER — SODIUM CHLORIDE 0.9 % IV SOLN
INTRAVENOUS | Status: DC
  Administered 2011-06-30: 09:00:00 via INTRAVENOUS

## 2011-06-30 ENCOUNTER — Ambulatory Visit (HOSPITAL_COMMUNITY)
Admission: RE | Admit: 2011-06-30 | Discharge: 2011-06-30 | Disposition: A | Discharge status: Home / Self Care | Payer: Medicare Other | Source: Ambulatory Visit | Attending: Vascular Surgery | Admitting: Vascular Surgery

## 2011-06-30 ENCOUNTER — Encounter (HOSPITAL_COMMUNITY)
Admission: RE | Disposition: A | Discharge status: Home / Self Care | Payer: Self-pay | Source: Ambulatory Visit | Attending: Vascular Surgery

## 2011-06-30 DIAGNOSIS — I6529 Occlusion and stenosis of unspecified carotid artery: Secondary | ICD-10-CM | POA: Insufficient documentation

## 2011-06-30 DIAGNOSIS — I714 Abdominal aortic aneurysm, without rupture, unspecified: Secondary | ICD-10-CM | POA: Insufficient documentation

## 2011-06-30 DIAGNOSIS — E785 Hyperlipidemia, unspecified: Secondary | ICD-10-CM | POA: Insufficient documentation

## 2011-06-30 DIAGNOSIS — I658 Occlusion and stenosis of other precerebral arteries: Secondary | ICD-10-CM | POA: Insufficient documentation

## 2011-06-30 DIAGNOSIS — Z8673 Personal history of transient ischemic attack (TIA), and cerebral infarction without residual deficits: Secondary | ICD-10-CM | POA: Insufficient documentation

## 2011-06-30 DIAGNOSIS — M810 Age-related osteoporosis without current pathological fracture: Secondary | ICD-10-CM | POA: Insufficient documentation

## 2011-06-30 HISTORY — PX: CAROTID ANGIOGRAM: SHX5504

## 2011-06-30 LAB — POCT I-STAT, CHEM 8
Calcium, Ion: 1.19 mmol/L (ref 1.12–1.32)
Chloride: 109 mEq/L (ref 96–112)
HCT: 42 % (ref 36.0–46.0)
Potassium: 4.1 mEq/L (ref 3.5–5.1)
Sodium: 142 mEq/L (ref 135–145)

## 2011-06-30 SURGERY — CAROTID ANGIOGRAM
Anesthesia: LOCAL

## 2011-06-30 MED ORDER — ACETAMINOPHEN 325 MG PO TABS: 650.0000 mg | ORAL_TABLET | ORAL | Status: DC | PRN

## 2011-06-30 MED ORDER — LIDOCAINE HCL (PF) 1 % IJ SOLN
INTRAMUSCULAR | Status: AC
  Filled 2011-06-30: qty 30

## 2011-06-30 MED ORDER — HEPARIN (PORCINE) IN NACL 2-0.9 UNIT/ML-% IJ SOLN
INTRAMUSCULAR | Status: AC
  Filled 2011-06-30: qty 1000

## 2011-06-30 MED ORDER — LABETALOL HCL 5 MG/ML IV SOLN
INTRAVENOUS | Status: AC
  Filled 2011-06-30: qty 4

## 2011-06-30 MED ORDER — OXYCODONE-ACETAMINOPHEN 5-325 MG PO TABS: 1.0000 | ORAL_TABLET | ORAL | Status: DC | PRN

## 2011-06-30 MED ORDER — SODIUM CHLORIDE 0.9 % IV SOLN: 1.0000 mL/kg/h | INTRAVENOUS | Status: DC

## 2011-06-30 MED ORDER — ONDANSETRON HCL 4 MG/2ML IJ SOLN: 4.0000 mg | Freq: Four times a day (QID) | INTRAMUSCULAR | Status: DC | PRN

## 2011-06-30 MED ORDER — LABETALOL HCL 5 MG/ML IV SOLN: 10.0000 mg | INTRAVENOUS | Status: DC | PRN

## 2011-06-30 NOTE — Op Note (Signed)
PATIENT: [name]   MRN: [id number] DOB: 10-Apr-1934    DATE OF PROCEDURE: 06/30/2011  INDICATIONS: [name] is a 78 y.o. female who has undergone bilateral carotid endarterectomies in the past. She was evaluated by Dr. Imogene Burn and carotid duplex scan showed greater than 80% recurrent carotid stenoses. She is brought in for elective cerebral arteriography in order to consider a carotid stenting versus redo carotid surgery.  PROCEDURE:  1. Ultrasound-guided access to the right common femoral artery 2. Arch aortogram 3. Selective right common carotid artery arteriogram 4. Selective left common carotid artery arteriogram 5. Aortogram  SURGEON: Di Kindle. Edilia Bo, MD, FACS  ANESTHESIA: local   EBL: minimal  TECHNIQUE: The patient was brought to the [facility] lab. Both groins were prepped and draped in the usual sterile fashion. After the skin was anesthetized with 1% lidocaine, and under ultrasound guidance, the right common femoral artery was cannulated and a guidewire introduced into the infrarenal aorta under fluoroscopic control. A 5 French sheath was introduced over the wire. A long pigtail catheter was positioned in the ascending aortic arch and an arch aortogram obtained at a 40 LAO projection. The pigtail catheter was exchanged in H1 catheter which was positioned into the innominate artery. The wire was advanced into the right common carotid artery and then each one the catheter advanced over the wire. Selective right common carotid arteriogram was obtained with both intracranial and extracranial views. Intracranial views will be interpreted separately by the neuroradiologist. Next the H1 catheter was exchanged for a Berenstein 1 catheter which was positioned into the left common carotid artery. Selective left common carotid arteriogram was obtained with both intracranial and extracranial views.  The Berenstein catheter was then exchanged for a pigtail catheter which was positioned at the L1  vertebral body. Flush aortogram was obtained as the patient does have an abdominal aortic aneurysm.  At the completion of the procedure the catheter was removed over a wire the sheath was removed and pressure held for hemostasis. No immediate complications were noted. Patient was neurologically intact.  FINDINGS:   1. No significant disease of the aortic arch 2. Subclavian arteries and vertebral arteries are patent bilaterally. 3. 90% stenosis at the proximal aspect of previous carotid patch. Significant calcific disease. Greater than 95% distal stenosis at the distal end of the right carotid patch. This stenosis is quite high.  4. 30% stenosis the proximal end of left carotid patch. 90% stenosis at the distal end of left carotid patch with a 90 curve just above the stenosis. The stenosis also is quite high. 5. Infrarenal abdominal aortic aneurysm. The size cannot be determined by this study.  Waverly Ferrari, MD, FACS [facility]  DATE OF DICTATION:   06/30/2011

## 2011-06-30 NOTE — H&P (View-Only) (Signed)
[facility] [place]  Established Carotid Patient  History of Present Illness  [name] is a 77 y.o. 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  By report, the patient believes Dr. Dickson previously did her surgeries.  Pt had an acute stroke in the hospital in August and was seen by Dr. Lawson.  Unfortunately, the patient continued to fail to follow up as scheduled.  Patient has known history of TIA or stroke symptoms but she can't remember which side.  The patient denies amaurosis fugax or monocular blindness.  The patient denies never had facial drooping or hemiplegia.  The patient has never had receptive or expressive aphasia.  The patient thinks her previous neurologic deficits have resolved.  She does not ambulate since her femoral fx.  Additionally, the patient has a known AAA.  She denies any abdominal or back pain.  Past Medical History  Diagnosis Date  . Abdominal aneurysm without mention of rupture   . Cholelithiasis   . Hyperlipidemia   . TIA (transient ischemic attack) ~ 09/2010    "[name]"  . Neuropathic pain   . Osteoporosis   . Arthritis   . Hypertension 04/30/11    "not a problem now"  . Pneumonia     "once or twice"  . Migraines   . Headache   . Peripheral neuropathy     "hands, feet, legs"    Past Surgical History  Procedure Date  . Carotid endarterectomy 03/1999; 04/1999    bilateral (Dr. Dickson)  . 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 Tonsillectomy     "when I was little"  . Abdominal hysterectomy 1984  . Tubal ligation 1975  . Dilation and curettage of uterus     "I've had a bunch of those"  . Colon surgery 2002    "8 inches of my colon taken out"  . Fracture surgery 02/2008    "femur; put rod & screws in"  . Fracture surgery 03/2008    "replaced screws in femur"  . Fracture surgery 04/2008    "replaced hardware S/P rebreak femur"     History   Social History  . Marital Status: Single    Spouse Name: N/A    Number of Children: N/A  . Years of Education: N/A   Occupational History  . Not on file.   Social History Main Topics  . Smoking status: Current Everyday Smoker -- 0.2 packs/day for 60 years    Types: Cigarettes  . Smokeless tobacco: Never Used  . Alcohol Use: No  . Drug Use: No     "I'm trying hard to quit smoking cigarettes"  . Sexually Active: Not on file   Other Topics Concern  . Not on file   Social History Narrative  . No narrative on file    Family History  Problem Relation Age of Onset  . Diabetes Mother   . Heart disease Mother   . Hypertension Mother   . Hyperlipidemia Mother   . Aneurysm Father   . Diabetes Brother     Current Outpatient Prescriptions on File Prior to Visit  Medication Sig Dispense Refill  . aspirin 81 MG tablet Take 1 tablet (81 mg total) by mouth daily.      . atorvastatin (LIPITOR) 20 MG tablet Take 20 mg by mouth every evening.        . cetirizine (ZYRTEC   ALLERGY) 10 MG tablet Take 10 mg by mouth daily.        . Cholecalciferol (VITAMIN D) 2000 UNITS tablet Take 2,000 Units by mouth daily.        . ezetimibe (ZETIA) 10 MG tablet Take 10 mg by mouth daily.        . imipramine (TOFRANIL) 50 MG tablet Take 50 mg by mouth at bedtime.        . pregabalin (LYRICA) 75 MG capsule Take 75 mg by mouth 2 (two) times daily.          Allergies  Allergen Reactions  . Codeine Rash  . Penicillins Rash    Review of Systems (Positive items checked otherwise negative)  General: [ ] Weight loss, [ ] Weight gain, [ ]  Loss of appetite, [ ] Fever  Neurologic: [x] Dizziness, [ ] Blackouts, [ ] Headaches, [ ] Seizure  Ear/Nose/Throat: [ ] Change in eyesight, [ ] Change in hearing, [ ] Nose bleeds, [ ] Sore throat  Vascular: [ ] Pain in legs with walking, [x] Pain in feet while lying flat, [ ] Non-healing ulcer, [x] Stroke, [x] "Mini stroke", [ ] Slurred speech, [ ]  Temporary blindness, [ ] Blood clot in vein, [ ] Phlebitis  Pulmonary: [ ] Home oxygen, [ ] Productive cough, [ ] Bronchitis, [ ] Coughing up blood,  [ ] Asthma, [ ] Wheezing  Musculoskeletal: [ ] Arthritis, [ ] Joint pain, [ ] Muscle pain  Cardiac: [ ] Chest pain, [ ] Chest tightness/pressure, [ ] Shortness of breath when lying flat, [ ] Shortness of breath with exertion, [ ] Palpitations, [ ] Heart murmur, [ ] Arrythmia,  [ ] Atrial fibrillation  Hematologic: [ ] Bleeding problems, [ ] Clotting disorder, [ ] Anemia  Psychiatric:  [ ] Depression, [ ] Anxiety, [ ] Attention deficit disorder  Gastrointestinal:  [ ] Black stool,[ ]  Blood in stool, [ ] Peptic ulcer disease, [ ] Reflux, [ ] Hiatal hernia, [ ] Trouble swallowing, [ ] Diarrhea, [ ] Constipation  Urinary:  [ ] Kidney disease, [ ] Burning with urination, [ ] Frequent urination, [ ] Difficulty urinating  Skin: [ ] Ulcers, [ ] Rashes   Physical Examination  Filed Vitals:   06/06/11 1132  BP: 114/75  Pulse: 65  Resp: 16  Height: 5' 4" (1.626 m)  Weight: 160 lb (72.576 kg)  SpO2: 100%   Body mass index is 27.46 kg/(m^2).  General: A&O x 3, WDWN  Eyes: PERRLA, EOMI  Pulmonary: Sym exp, good air movt, CTAB, no rales, rhonchi, & wheezing  Cardiac: RRR, Nl S1, S2, no Murmurs, rubs or gallops  Vascular: Vessel Right Left  Radial Palpable Palpable  Brachial Palpable Palpable  Carotid Palpable, without bruit Palpable, without bruit  Aorta Non-palpable N/A  Femoral Palpable Palpable  Popliteal Non-palpable Non-palpable  PT Non-Palpable Non-Palpable  DP Non-Palpable Non-Palpable   Gastrointestinal: soft, NTND, -G/R, - HSM, - masses, - CVAT B  Musculoskeletal: M/S 5/5 throughout except 4/5 DF/PF in BLE, Extremities without ischemic changes , Gait not tested  Neurologic: CN 2-12 intact , Pain and light touch intact in extremities , Motor exam as listed above  Non-Invasive Vascular Imaging  CAROTID DUPLEX  (Date: 06/06/11):   R ICA stenosis: 80-99%  R VA: patent and antegrade  L ICA stenosis: 80-99%  L VA: patent and antegrade  AAA Duplex (Date: 06/06/11)  Max size: 4.44 cmx 4.54 cm  Medical   Decision Making  [name] is a 77 y.o. female who presents with: B ICA stenosis in the setting previous B CEA, small asx AAA.   Based on the patient's vascular studies and examination, I have offered the patient: B carotid and cerebral angiogram to evaluate the lesion and aortic arch for possible carotid stenting.  Pt will be scheduled with Dr. Dickson, per her wishes. I discussed with the patient the nature of angiographic procedures, especially the limited patencies of any endovascular intervention.   The patient is aware of that the risks of an angiographic procedure include but are not limited to: bleeding, infection, access site complications, renal failure, embolization, rupture of vessel, dissection, possible need for emergent surgical intervention, possible need for surgical procedures to treat the patient's pathology, and stroke and death.   The patient is aware of the risks and agrees to proceed. She also need preop risk stratification and optimization with her Cardiologist.  I discussed in depth with the patient the nature of atherosclerosis, and emphasized the importance of maximal medical management including strict control of blood pressure, blood glucose, and lipid levels, obtaining regular exercise, anti-platelet agents, and cessation of smoking.  The patient is aware that without maximal medical management the underlying atherosclerotic disease process will progress, limiting the benefit of any interventions.  Thank you for allowing us to participate in this patient's care.  Ludie Hudon, MD [facility] [place] Office: [phone] Pager: [phone]  06/06/2011, 5:28 PM     

## 2011-06-30 NOTE — Interval H&P Note (Signed)
History and Physical Interval Note:  06/30/2011 9:54 AM  [name]  has presented today for surgery, with the diagnosis of Stenosis  The various methods of treatment have been discussed with the patient and family. After consideration of risks, benefits and other options for treatment, the patient has consented to: CAROTID ANGIOGRAM.  The patients' history has been reviewed, patient examined, no change in status, stable for surgery.  I have reviewed the patients' chart and labs.  Questions were answered to the patient's satisfaction.     [name]

## 2011-07-16 ENCOUNTER — Ambulatory Visit: Payer: Medicare Other | Admitting: Vascular Surgery

## 2011-07-16 ENCOUNTER — Encounter: Payer: Self-pay | Admitting: Vascular Surgery

## 2011-07-17 ENCOUNTER — Ambulatory Visit (INDEPENDENT_AMBULATORY_CARE_PROVIDER_SITE_OTHER): Payer: Medicare Other | Admitting: Vascular Surgery

## 2011-07-17 ENCOUNTER — Encounter: Payer: Self-pay | Admitting: Vascular Surgery

## 2011-07-17 VITALS — BP 85/58 | HR 85 | Resp 12 | Ht 64.0 in | Wt 165.0 lb

## 2011-07-17 DIAGNOSIS — I714 Abdominal aortic aneurysm, without rupture: Secondary | ICD-10-CM

## 2011-07-17 DIAGNOSIS — I6529 Occlusion and stenosis of unspecified carotid artery: Secondary | ICD-10-CM

## 2011-07-17 NOTE — Progress Notes (Signed)
Patient is a 78 year old female referred to my partner Dr. Edilia Bo for consideration of bilateral carotid artery stenting. The patient previously underwent bilateral carotid endarterectomies in 2000 by Dr. Edilia Bo. She is currently asymptomatic. However her son thinks that she may have had a mini stroke 6 months ago. This did not have focal neuro symptoms. The patient is overall fairly debilitated and gets around in a wheelchair. She does have history of COPD but does not require home oxygen. She is currently still abusing tobacco. She is on aspirin. She also previously has had a brain aneurysm that was repaired in 2000. She does occasionally have dizziness. Other chronic medical problems include hypertension, hyperlipidemia, prior pneumonias, neuropathy. These problems are currently controlled  She also has a known history of abdominal aortic aneurysm which was recently around 4 cm and was evaluated by Dr. Imogene Burn.  Review of systems: The patient becomes short of breath with minimal exertion. She also has minimal functional reserve at this point and it takes her several weeks to recover from any episode in the hospital. She is able to perform some activities of daily living but has a fair amount of assistance from her family especially her son. She denies history of GI bleeding. She denies history of renal dysfunction. She tolerated her recent arteriogram without difficulty. Musculoskeletal she has severe degenerative arthritis in multiple joints.  Skin she has no current ulcers or rash.  Neuro she has no TIA or stroke symptoms currently  Past Medical History  Diagnosis Date  . Abdominal aneurysm without mention of rupture   . Cholelithiasis   . Hyperlipidemia   . TIA (transient ischemic attack) ~ 09/2010    "[name]"  . Neuropathic pain   . Osteoporosis   . Arthritis   . Hypertension 04/30/11    "not a problem now"  . Pneumonia     "once or twice"  . Migraines   .  Headache   . Peripheral neuropathy     "hands, feet, legs"     Past Surgical History  Procedure Date  . Carotid endarterectomy 03/1999; 04/1999    bilateral (Dr. Edilia Bo)  . 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 Tonsillectomy     "when I was little"  . Abdominal hysterectomy 1984  . Tubal ligation 1975  . Dilation and curettage of uterus     "I've had a bunch of those"  . Colon surgery 2002    "8 inches of my colon taken out"  . Fracture surgery 02/2008    "femur; put rod & screws in"  . Fracture surgery 03/2008    "replaced screws in femur"  . Fracture surgery 04/2008    "replaced hardware S/P rebreak femur"   Brain aneurysm repair  History   Social History  . Marital Status: Single    Spouse Name: N/A    Number of Children: N/A  . Years of Education: N/A   Occupational History  . Not on file.   Social History Main Topics  . Smoking status: Current Everyday Smoker -- 0.2 packs/day for 60 years    Types: Cigarettes  . Smokeless tobacco: Never Used  . Alcohol Use: No  . Drug Use: No     "I'm trying hard to quit smoking cigarettes"  . Sexually Active: Not on file   Other Topics Concern  . Not on file   Social History Narrative  . No narrative on file    Family  History  Problem Relation Age of Onset  . Diabetes Mother   . Heart disease Mother   . Hypertension Mother   . Hyperlipidemia Mother   . Aneurysm Father   . Diabetes Brother     Physical exam:  Filed Vitals:   07/17/11 1028  BP: 85/58  Pulse: 85  Resp: 12  Height: 5\' 4"  (1.626 m)  Weight: 165 lb (74.844 kg)    Neck: No carotid bruits  Chest: Clear to auscultation bilaterally  Cardiac: Regular rate and rhythm  Abdomen: Thin soft palpable pulsatile mass  Extremities: 1-2+ femoral pulses bilaterally, no significant edema  Neuro: Upper extremity and lower extremity motor strength 4/5 and symmetric, some mild slurring of speech  Skin: No ulcer or rash  Data: I reviewed  the carotid angiogram performed by Dr. Edilia Bo in mid January. This shows greater than 90% stenosis of the right internal carotid artery as well as the left. She has a type I-type II aortic arch. She essentially has a string sign in the right internal carotid artery. There is only a short segment of distal internal carotid artery for landing zone bilaterally.  Assessment: Bilateral high grade internal carotid artery stenosis which potentially would be amenable to carotid stenting. I had a lengthy discussion with the patient and her son today regarding risks benefits possible complications and procedure details of carotid stenting. I did discuss with them that she would be at risk of stroke and put her risk of probably greater than 5% with her comorbidities and the appearance of the lesion. However she would be very high risk for an open carotid endarterectomy operation. I discussed with her today the possibility of medical management versus carotid artery stenting and the advantages and disadvantages to these. I also discussed with them that the risk of medical management alone is high risk of stroke long-term. I had a lengthy discussion with the patient and her son today regarding end-of-life issues as far as her wishes if she had a complication with the procedure as far as deciding her opinion on code status and interventions to prolong her life. They're going to discuss this further as well.  Plan: The patient will followup in 6 months time with Dr. Edilia Bo for an abdominal aortic ultrasound for continued surveillance of her aneurysm. #2 is far as her carotid stenosis is concerned I have offered her carotid stenting.   The patient and her son are going to discuss is to see whether they wish to schedule this in the near future. If they wish to schedule the she will need to be on Plavix and aspirin. We'll need to have this coordinated and possibly consider her for [place] protocol.  Fabienne Bruns,  MD [facility] [place] Office: [phone] Pager: [phone]

## 2011-07-24 ENCOUNTER — Telehealth: Payer: Self-pay | Admitting: Vascular Surgery

## 2011-07-24 NOTE — Telephone Encounter (Addendum)
Patient had several questions regarding possible carotid stenting. She has bilateral high grade redo carotid stenoses. I discussed with her the possibility of stroke risk of 5-10% per year with medical management.   I discussed with her that she would not be at low risk of stroke with stenting due to the type of lesion that she has but that her overall stroke risk should be less than with no intervention at all. She has decided at this point undergo carotid stenting. We will schedule her for this in the near future. She is currently on aspirin alone. We will need to start her on aspirin and Plavix combined. She understands and agrees to proceed.  Fabienne Bruns, MD [facility] [place] Office: [phone] Pager: [phone]   Pt called our office on Feb. 8 2013 and has now decided she does not want a carotid intervention.  Fabienne Bruns, MD

## 2011-07-25 ENCOUNTER — Telehealth: Payer: Self-pay | Admitting: *Deleted

## 2011-07-25 NOTE — Telephone Encounter (Signed)
[name] called this morning and told [name] and me that "I don't want anymore surgery". She wanted [place] to tell Dr Darrick Penna that she has changed her mind since yesterday and she "don't want anymore surgery".

## 2011-07-28 ENCOUNTER — Other Ambulatory Visit: Payer: Self-pay | Admitting: *Deleted

## 2011-07-28 DIAGNOSIS — I6529 Occlusion and stenosis of unspecified carotid artery: Secondary | ICD-10-CM

## 2011-07-28 DIAGNOSIS — I714 Abdominal aortic aneurysm, without rupture: Secondary | ICD-10-CM

## 2011-08-28 ENCOUNTER — Encounter (HOSPITAL_COMMUNITY): Payer: Self-pay | Admitting: Emergency Medicine

## 2011-08-28 ENCOUNTER — Emergency Department (HOSPITAL_COMMUNITY): Payer: Medicare Other

## 2011-08-28 ENCOUNTER — Inpatient Hospital Stay (HOSPITAL_COMMUNITY)
Admission: EM | Admit: 2011-08-28 | Discharge: 2011-09-02 | DRG: 536 | Disposition: A | Discharge status: Skilled Nursing Facility | Payer: Medicare Other | Source: Ambulatory Visit | Attending: Internal Medicine | Admitting: Internal Medicine

## 2011-08-28 DIAGNOSIS — D72829 Elevated white blood cell count, unspecified: Secondary | ICD-10-CM | POA: Diagnosis present

## 2011-08-28 DIAGNOSIS — Z833 Family history of diabetes mellitus: Secondary | ICD-10-CM

## 2011-08-28 DIAGNOSIS — Z880 Allergy status to penicillin: Secondary | ICD-10-CM

## 2011-08-28 DIAGNOSIS — Z8249 Family history of ischemic heart disease and other diseases of the circulatory system: Secondary | ICD-10-CM

## 2011-08-28 DIAGNOSIS — E785 Hyperlipidemia, unspecified: Secondary | ICD-10-CM | POA: Diagnosis present

## 2011-08-28 DIAGNOSIS — R197 Diarrhea, unspecified: Secondary | ICD-10-CM | POA: Diagnosis present

## 2011-08-28 DIAGNOSIS — S72009A Fracture of unspecified part of neck of unspecified femur, initial encounter for closed fracture: Secondary | ICD-10-CM

## 2011-08-28 DIAGNOSIS — Z7982 Long term (current) use of aspirin: Secondary | ICD-10-CM

## 2011-08-28 DIAGNOSIS — Z96649 Presence of unspecified artificial hip joint: Secondary | ICD-10-CM

## 2011-08-28 DIAGNOSIS — F17200 Nicotine dependence, unspecified, uncomplicated: Secondary | ICD-10-CM | POA: Diagnosis present

## 2011-08-28 DIAGNOSIS — R5381 Other malaise: Secondary | ICD-10-CM | POA: Diagnosis present

## 2011-08-28 DIAGNOSIS — W19XXXA Unspecified fall, initial encounter: Secondary | ICD-10-CM

## 2011-08-28 DIAGNOSIS — I714 Abdominal aortic aneurysm, without rupture, unspecified: Secondary | ICD-10-CM | POA: Diagnosis present

## 2011-08-28 DIAGNOSIS — Z8673 Personal history of transient ischemic attack (TIA), and cerebral infarction without residual deficits: Secondary | ICD-10-CM

## 2011-08-28 DIAGNOSIS — G609 Hereditary and idiopathic neuropathy, unspecified: Secondary | ICD-10-CM | POA: Diagnosis present

## 2011-08-28 DIAGNOSIS — S72109A Unspecified trochanteric fracture of unspecified femur, initial encounter for closed fracture: Principal | ICD-10-CM | POA: Diagnosis present

## 2011-08-28 DIAGNOSIS — M810 Age-related osteoporosis without current pathological fracture: Secondary | ICD-10-CM | POA: Diagnosis present

## 2011-08-28 DIAGNOSIS — W06XXXA Fall from bed, initial encounter: Secondary | ICD-10-CM | POA: Diagnosis present

## 2011-08-28 DIAGNOSIS — Y92009 Unspecified place in unspecified non-institutional (private) residence as the place of occurrence of the external cause: Secondary | ICD-10-CM

## 2011-08-28 DIAGNOSIS — Z79899 Other long term (current) drug therapy: Secondary | ICD-10-CM

## 2011-08-28 DIAGNOSIS — I10 Essential (primary) hypertension: Secondary | ICD-10-CM | POA: Diagnosis present

## 2011-08-28 DIAGNOSIS — S72115A Nondisplaced fracture of greater trochanter of left femur, initial encounter for closed fracture: Secondary | ICD-10-CM

## 2011-08-28 LAB — DIFFERENTIAL
Basophils Absolute: 0 10*3/uL (ref 0.0–0.1)
Basophils Relative: 0 % (ref 0–1)
Eosinophils Absolute: 3.3 10*3/uL — ABNORMAL HIGH (ref 0.0–0.7)
Eosinophils Relative: 22 % — ABNORMAL HIGH (ref 0–5)
Lymphocytes Relative: 29 % (ref 12–46)
Lymphs Abs: 4.3 10*3/uL — ABNORMAL HIGH (ref 0.7–4.0)
Monocytes Absolute: 0.8 10*3/uL (ref 0.1–1.0)
Monocytes Relative: 5 % (ref 3–12)
Neutro Abs: 6.3 10*3/uL (ref 1.7–7.7)
Neutrophils Relative %: 43 % (ref 43–77)

## 2011-08-28 LAB — CBC
HCT: 38 % (ref 36.0–46.0)
Hemoglobin: 12.4 g/dL (ref 12.0–15.0)
MCH: 29.8 pg (ref 26.0–34.0)
MCHC: 32.6 g/dL (ref 30.0–36.0)
MCV: 91.3 fL (ref 78.0–100.0)
Platelets: 159 10*3/uL (ref 150–400)
RBC: 4.16 MIL/uL (ref 3.87–5.11)
RDW: 15.3 % (ref 11.5–15.5)
WBC: 14.7 10*3/uL — ABNORMAL HIGH (ref 4.0–10.5)

## 2011-08-28 MED ORDER — MORPHINE SULFATE 4 MG/ML IJ SOLN
4.0000 mg | Freq: Once | INTRAMUSCULAR | Status: AC
  Administered 2011-08-28: 4 mg via INTRAVENOUS
  Filled 2011-08-28: qty 1

## 2011-08-28 MED ORDER — ONDANSETRON HCL 4 MG/2ML IJ SOLN
4.0000 mg | Freq: Once | INTRAMUSCULAR | Status: AC
  Administered 2011-08-28: 4 mg via INTRAVENOUS
  Filled 2011-08-28: qty 2

## 2011-08-28 NOTE — ED Notes (Signed)
Patient states trying to get out of bed into wheelchair. Slipped fell on buttocks. Pain left hip / groin.  Pain at rest 0/10 with movement pain 10/10 sharp.  Pedal pulses +2 states intermittent sensation bilateral feet normal for patient.  Airway intact bilateral equal chest rise and fall.

## 2011-08-28 NOTE — ED Notes (Signed)
EKG done EMS reported irregular heart beat on monitor.

## 2011-08-28 NOTE — ED Notes (Signed)
Patient fell getting out of bed trying to get into wheel chair.  Landed on her buttocks.  States pain left hip/groin.   Denies dizziness or LOC.

## 2011-08-28 NOTE — ED Notes (Addendum)
Patient transported to CT 

## 2011-08-28 NOTE — ED Notes (Signed)
Patient transported to X-ray 

## 2011-08-28 NOTE — ED Notes (Signed)
Pt returned from CT w/o incident.

## 2011-08-29 ENCOUNTER — Inpatient Hospital Stay (HOSPITAL_COMMUNITY): Payer: Medicare Other

## 2011-08-29 ENCOUNTER — Encounter (HOSPITAL_COMMUNITY): Payer: Self-pay | Admitting: Internal Medicine

## 2011-08-29 DIAGNOSIS — W19XXXA Unspecified fall, initial encounter: Secondary | ICD-10-CM | POA: Diagnosis present

## 2011-08-29 DIAGNOSIS — S72009A Fracture of unspecified part of neck of unspecified femur, initial encounter for closed fracture: Secondary | ICD-10-CM | POA: Diagnosis present

## 2011-08-29 DIAGNOSIS — D72829 Elevated white blood cell count, unspecified: Secondary | ICD-10-CM | POA: Diagnosis present

## 2011-08-29 DIAGNOSIS — R5381 Other malaise: Secondary | ICD-10-CM | POA: Diagnosis present

## 2011-08-29 LAB — BASIC METABOLIC PANEL
BUN: 29 mg/dL — ABNORMAL HIGH (ref 6–23)
CO2: 19 mEq/L (ref 19–32)
Calcium: 6.5 mg/dL — ABNORMAL LOW (ref 8.4–10.5)
Chloride: 116 mEq/L — ABNORMAL HIGH (ref 96–112)
Creatinine, Ser: 0.9 mg/dL (ref 0.50–1.10)
GFR calc Af Amer: 71 mL/min — ABNORMAL LOW (ref >90)
GFR calc non Af Amer: 61 mL/min — ABNORMAL LOW (ref >90)
Glucose, Bld: 71 mg/dL (ref 70–99)
Potassium: 3.6 mEq/L (ref 3.5–5.1)
Sodium: 143 mEq/L (ref 135–145)

## 2011-08-29 LAB — COMPREHENSIVE METABOLIC PANEL
ALT: 55 U/L — ABNORMAL HIGH (ref 0–35)
AST: 52 U/L — ABNORMAL HIGH (ref 0–37)
Albumin: 2.9 g/dL — ABNORMAL LOW (ref 3.5–5.2)
CO2: 28 mEq/L (ref 19–32)
Chloride: 109 mEq/L (ref 96–112)
Creatinine, Ser: 1.27 mg/dL — ABNORMAL HIGH (ref 0.50–1.10)
Sodium: 142 mEq/L (ref 135–145)
Total Bilirubin: 0.2 mg/dL — ABNORMAL LOW (ref 0.3–1.2)

## 2011-08-29 LAB — URINALYSIS, ROUTINE W REFLEX MICROSCOPIC
Glucose, UA: NEGATIVE mg/dL
Hgb urine dipstick: NEGATIVE
Ketones, ur: NEGATIVE mg/dL
Protein, ur: NEGATIVE mg/dL
Urobilinogen, UA: 0.2 mg/dL (ref 0.0–1.0)

## 2011-08-29 LAB — TSH: TSH: 2.235 u[IU]/mL (ref 0.350–4.500)

## 2011-08-29 LAB — CBC
Hemoglobin: 12.2 g/dL (ref 12.0–15.0)
MCH: 29.8 pg (ref 26.0–34.0)
MCHC: 31.9 g/dL (ref 30.0–36.0)
MCV: 93.4 fL (ref 78.0–100.0)
Platelets: 160 10*3/uL (ref 150–400)
RBC: 4.1 MIL/uL (ref 3.87–5.11)

## 2011-08-29 LAB — PHOSPHORUS: Phosphorus: 3.4 mg/dL (ref 2.3–4.6)

## 2011-08-29 LAB — CALCIUM, IONIZED: Calcium, Ion: 1.24 mmol/L (ref 1.12–1.32)

## 2011-08-29 MED ORDER — ACETAMINOPHEN 650 MG RE SUPP: 650.0000 mg | Freq: Four times a day (QID) | RECTAL | Status: DC | PRN

## 2011-08-29 MED ORDER — EZETIMIBE 10 MG PO TABS
10.0000 mg | ORAL_TABLET | Freq: Every day | ORAL | Status: DC
  Administered 2011-08-29 – 2011-09-02 (×5): 10 mg via ORAL
  Filled 2011-08-29 (×5): qty 1

## 2011-08-29 MED ORDER — SODIUM CHLORIDE 0.9 % IV SOLN
INTRAVENOUS | Status: AC
  Administered 2011-08-29 – 2011-08-31 (×6): via INTRAVENOUS

## 2011-08-29 MED ORDER — GUAIFENESIN-DM 100-10 MG/5ML PO SYRP
5.0000 mL | ORAL_SOLUTION | ORAL | Status: DC | PRN
  Filled 2011-08-29: qty 5

## 2011-08-29 MED ORDER — PANCRELIPASE (LIP-PROT-AMYL) 12000-38000 UNITS PO CPEP
2.0000 | ORAL_CAPSULE | Freq: Three times a day (TID) | ORAL | Status: DC
  Administered 2011-08-29 – 2011-09-02 (×13): 2 via ORAL
  Filled 2011-08-29 (×16): qty 2

## 2011-08-29 MED ORDER — LORATADINE 10 MG PO TABS
10.0000 mg | ORAL_TABLET | Freq: Every day | ORAL | Status: DC
  Administered 2011-08-29 – 2011-09-02 (×5): 10 mg via ORAL
  Filled 2011-08-29 (×5): qty 1

## 2011-08-29 MED ORDER — ALUM & MAG HYDROXIDE-SIMETH 200-200-20 MG/5ML PO SUSP: 30.0000 mL | Freq: Four times a day (QID) | ORAL | Status: DC | PRN

## 2011-08-29 MED ORDER — HYDROCODONE-ACETAMINOPHEN 5-325 MG PO TABS
1.0000 | ORAL_TABLET | ORAL | Status: DC | PRN
  Administered 2011-08-29 – 2011-09-02 (×8): 1 via ORAL
  Filled 2011-08-29 (×8): qty 1

## 2011-08-29 MED ORDER — MORPHINE SULFATE 2 MG/ML IJ SOLN
2.0000 mg | INTRAMUSCULAR | Status: DC | PRN
  Administered 2011-08-29: 2 mg via INTRAVENOUS
  Filled 2011-08-29: qty 1

## 2011-08-29 MED ORDER — SODIUM CHLORIDE 0.9 % IV SOLN
1.0000 g | Freq: Once | INTRAVENOUS | Status: AC
  Administered 2011-08-29: 1 g via INTRAVENOUS
  Filled 2011-08-29 (×2): qty 10

## 2011-08-29 MED ORDER — OMEGA-3 FATTY ACIDS 1000 MG PO CAPS: 2.0000 g | ORAL_CAPSULE | ORAL | Status: DC

## 2011-08-29 MED ORDER — IMIPRAMINE HCL 50 MG PO TABS
50.0000 mg | ORAL_TABLET | Freq: Every day | ORAL | Status: DC
  Administered 2011-08-29 – 2011-09-01 (×4): 50 mg via ORAL
  Filled 2011-08-29 (×5): qty 1

## 2011-08-29 MED ORDER — ATORVASTATIN CALCIUM 80 MG PO TABS
80.0000 mg | ORAL_TABLET | Freq: Every day | ORAL | Status: DC
  Administered 2011-08-29 – 2011-09-02 (×5): 80 mg via ORAL
  Filled 2011-08-29 (×5): qty 1

## 2011-08-29 MED ORDER — ACETAMINOPHEN 325 MG PO TABS
650.0000 mg | ORAL_TABLET | Freq: Four times a day (QID) | ORAL | Status: DC | PRN
  Administered 2011-08-31 (×2): 650 mg via ORAL
  Filled 2011-08-29 (×2): qty 2

## 2011-08-29 MED ORDER — ASPIRIN EC 325 MG PO TBEC
325.0000 mg | DELAYED_RELEASE_TABLET | Freq: Every day | ORAL | Status: DC
  Administered 2011-08-29 – 2011-09-02 (×5): 325 mg via ORAL
  Filled 2011-08-29 (×5): qty 1

## 2011-08-29 MED ORDER — ALBUTEROL SULFATE (5 MG/ML) 0.5% IN NEBU: 2.5000 mg | INHALATION_SOLUTION | RESPIRATORY_TRACT | Status: DC | PRN

## 2011-08-29 MED ORDER — OMEGA-3-ACID ETHYL ESTERS 1 G PO CAPS
1.0000 g | ORAL_CAPSULE | Freq: Every day | ORAL | Status: DC
  Administered 2011-08-29 – 2011-09-02 (×5): 1 g via ORAL
  Filled 2011-08-29 (×5): qty 1

## 2011-08-29 MED ORDER — ONDANSETRON HCL 4 MG PO TABS: 4.0000 mg | ORAL_TABLET | Freq: Four times a day (QID) | ORAL | Status: DC | PRN

## 2011-08-29 MED ORDER — PREGABALIN 75 MG PO CAPS
75.0000 mg | ORAL_CAPSULE | Freq: Two times a day (BID) | ORAL | Status: DC
  Administered 2011-08-29 – 2011-09-01 (×8): 75 mg via ORAL
  Filled 2011-08-29 (×8): qty 1

## 2011-08-29 MED ORDER — ONDANSETRON HCL 4 MG/2ML IJ SOLN: 4.0000 mg | Freq: Four times a day (QID) | INTRAMUSCULAR | Status: DC | PRN

## 2011-08-29 MED ORDER — HYDROCODONE-ACETAMINOPHEN 5-325 MG PO TABS
1.0000 | ORAL_TABLET | ORAL | Status: DC | PRN
  Administered 2011-08-29 (×2): 1 via ORAL
  Filled 2011-08-29 (×2): qty 1

## 2011-08-29 NOTE — ED Provider Notes (Signed)
History     CSN: [id number]  Arrival date & time 08/28/11  1854   First MD Initiated Contact with Patient 08/28/11 1855      Chief Complaint  Patient presents with  . Fall    (Consider location/radiation/quality/duration/timing/severity/associated sxs/prior treatment) The history is provided by the patient.  The patient states that she fell off of her bed as she was trying to move to her wheelchair. The patient complains of L hip pain. She still walks to the bathroom with a walker. The patient denies chest pain, SOB, weakness, N/V, numbness, abdominal pain, neck pain, headache or visual changes. The patient states that the pain is worse with movement or pressure.  The patient has no head or neck pain. She states that she did not hit her head.  Past Medical History  Diagnosis Date  . Abdominal aneurysm without mention of rupture   . Cholelithiasis   . Hyperlipidemia   . TIA (transient ischemic attack) ~ 09/2010    "[name]"  . Neuropathic pain   . Osteoporosis   . Arthritis   . Hypertension 04/30/11    "not a problem now"  . Pneumonia     "once or twice"  . Migraines   . Headache   . Peripheral neuropathy     "hands, feet, legs"    Past Surgical History  Procedure Date  . Carotid endarterectomy 03/1999; 04/1999    bilateral (Dr. Edilia Bo)  . 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 Tonsillectomy     "when I was little"  . Abdominal hysterectomy 1984  . Tubal ligation 1975  . Dilation and curettage of uterus     "I've had a bunch of those"  . Colon surgery 2002    "8 inches of my colon taken out"  . Fracture surgery 02/2008    "femur; put rod & screws in"  . Fracture surgery 03/2008    "replaced screws in femur"  . Fracture surgery 04/2008    "replaced hardware S/P rebreak femur"    Family History  Problem Relation Age of Onset  . Diabetes Mother   . Heart disease Mother   . Hypertension Mother   . Hyperlipidemia Mother   .  Aneurysm Father   . Diabetes Brother     History  Substance Use Topics  . Smoking status: Current Everyday Smoker -- 0.2 packs/day for 60 years    Types: Cigarettes  . Smokeless tobacco: Never Used  . Alcohol Use: No    OB History    Grav Para Term Preterm Abortions TAB SAB Ect Mult Living                  Review of Systems All pertinent positives and negatives reviewed in the history of present illness  Allergies  Codeine and Penicillins  Home Medications   Current Outpatient Rx  Name Route Sig Dispense Refill  . ASPIRIN EC 325 MG PO TBEC Oral Take 325 mg by mouth every morning.    Marland Kitchen CETIRIZINE HCL 10 MG PO TABS Oral Take 10 mg by mouth every morning.     Marland Kitchen CREON 24000 UNITS PO CPEP Oral Take 2 capsules by mouth 3 (three) times daily before meals.     Marland Kitchen EZETIMIBE 10 MG PO TABS Oral Take 10 mg by mouth daily.      . OMEGA-3 FATTY ACIDS 1000 MG PO CAPS Oral Take 2 g by mouth every morning.    Marland Kitchen  IMIPRAMINE HCL 50 MG PO TABS Oral Take 50 mg by mouth at bedtime.      Marland Kitchen PREGABALIN 75 MG PO CAPS Oral Take 75 mg by mouth 2 (two) times daily.      Marland Kitchen ROSUVASTATIN CALCIUM 40 MG PO TABS Oral Take 40 mg by mouth daily with supper.       BP 114/60  Pulse 94  Temp(Src) 98.2 F (36.8 C) (Oral)  Resp 19  SpO2 93%  Physical Exam  Constitutional: She is oriented to person, place, and time. She appears well-developed and well-nourished. No distress.  HENT:  Head: Normocephalic and atraumatic.  Eyes: EOM are normal. Pupils are equal, round, and reactive to light.  Neck: Normal range of motion. Neck supple.  Cardiovascular: Normal rate, regular rhythm and normal heart sounds.  Exam reveals no gallop and no friction rub.   No murmur heard. Pulmonary/Chest: Effort normal and breath sounds normal. No respiratory distress. She has no wheezes.  Abdominal: Soft. Bowel sounds are normal.  Musculoskeletal:       Left hip: She exhibits decreased range of motion and tenderness. She exhibits  no deformity.       Cervical back: She exhibits normal range of motion, no tenderness, no pain and no spasm.       Thoracic back: She exhibits normal range of motion, no tenderness, no pain and no spasm.       Lumbar back: She exhibits normal range of motion, no tenderness, no pain and no spasm.       Legs: Neurological: She is alert and oriented to person, place, and time. She displays normal reflexes. She exhibits normal muscle tone. Coordination normal.  Skin: Skin is warm and dry.    ED Course  Procedures (including critical care time)  Labs Reviewed  CBC - Abnormal; Notable for the following:    WBC 14.7 (*)    All other components within normal limits  DIFFERENTIAL - Abnormal; Notable for the following:    Lymphs Abs 4.3 (*)    Eosinophils Relative 22 (*)    Eosinophils Absolute 3.3 (*)    All other components within normal limits  BASIC METABOLIC PANEL - Abnormal; Notable for the following:    Chloride 116 (*)    BUN 29 (*)    Calcium 6.5 (*)    GFR calc non Af Amer 61 (*)    GFR calc Af Amer 71 (*)    All other components within normal limits  URINALYSIS, ROUTINE W REFLEX MICROSCOPIC   Dg Hip Complete Left  08/28/2011  *RADIOLOGY REPORT*  Clinical Data: Left hip pain  LEFT HIP - COMPLETE 2+ VIEW  Comparison: 01/15/2011  Findings: Left total hip arthroplasty with anatomic alignment.  No breakage or loosening of the hardware.  Side plate in the left femur with cerclage wires are in place.  Osteopenia.  No acute fracture and no dislocation.  IMPRESSION: No acute bony pathology.  Original Report Authenticated By: Donavan Burnet, M.D.   Ct Hip Left Wo Contrast  08/28/2011  *RADIOLOGY REPORT*  Clinical Data: Left hip pain  CT OF THE LEFT HIP WITHOUT CONTRAST  Technique:  Multidetector CT imaging was performed according to the standard protocol. Multiplanar CT image reconstructions were also generated.  Comparison: None.  Findings: A left total hip arthroplasty is in place.  There  is a fracture involving the proximal left femur.  The fracture line primarily involves the greater trochanter.  The fracture line extends inferiorly at the  lateral proximal femur.  The fracture line does not extend across the shaft of the femur.  The hardware is intact.  Fracture fragments are minimally displaced.  Infrarenal abdominal aortic aneurysm is 4.9 cm in maximal transverse diameter.  It is partially imaged.  IMPRESSION: Acute fracture essentially involving the greater trochanter of the femur.  The fracture does not extend across the shaft of the proximal left femur.  The hardware is intact.  4.9 cm abdominal aortic aneurysm is partially imaged.  Original Report Authenticated By: Donavan Burnet, M.D.     I spoke with Dr. Turner Daniels about the patient and he reviewed her CT scans and felt that this is non surgical and that she may need pain control admit. The patient has been stable while here in the ER. She will be admitted to the hospital for pain control and possible rehab placement.    MDM  MDM Reviewed: nursing note and vitals Interpretation: labs and ECG            Carlyle Dolly, PA-C 08/29/11 0138  Carlyle Dolly, PA-C 08/29/11 0210

## 2011-08-29 NOTE — Progress Notes (Signed)
Speech Language/Pathology Clinical/Bedside Swallow Evaluation Patient Details  Name: [name] MRN: [id number] DOB: [birth year] Today's Date: 08/29/2011  Past Medical History:  Past Medical History  Diagnosis Date  . Abdominal aneurysm without mention of rupture   . Cholelithiasis   . Hyperlipidemia   . TIA (transient ischemic attack) ~ 09/2010    "[name]"  . Neuropathic pain   . Osteoporosis   . Arthritis   . Hypertension 04/30/11    "not a problem now"  . Pneumonia     "once or twice"  . Migraines   . Headache   . Peripheral neuropathy     "hands, feet, legs"   Past Surgical History:  Past Surgical History  Procedure Date  . Carotid endarterectomy 03/1999; 04/1999    bilateral (Dr. Edilia Bo)  . 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 Tonsillectomy     "when I was little"  . Abdominal hysterectomy 1984  . Tubal ligation 1975  . Dilation and curettage of uterus     "I've had a bunch of those"  . Colon surgery 2002    "8 inches of my colon taken out"  . Fracture surgery 02/2008    "femur; put rod & screws in"  . Fracture surgery 03/2008    "replaced screws in femur"  . Fracture surgery 04/2008    "replaced hardware S/P rebreak femur"   HPI:  Pt admitted during night shift from home after fall and sustaining L femur trochanteric fx. Pt is wheelchair bound at baseline, smokes 4 cigarettes a day. Denies any difficutly breathing though she is on O2 now due to sats in low 90s. Pt reports she has had some difficutly swallowing. She explains that she has allergies and mucous builds up in her throat shile she is eating and drinking and she gets choked sometimes. Pt denies any h/o GER but does report she burps after meals. She states she has more difficulty with straws. No h/o pna in the last 5 years per pt.    Assessment/Recommendations/Treatment Plan    SLP Assessment Clinical Impression Statement: PT does not present with any overt s/s  of aspiration or dysphagia. She does report complaints c/w with occasional delay/penetration with large sips of thin liquids, possible due to undiagnosed respiratory difficulty or age realted dysphagia. Pt also may suffere from silent reflux given some of her complaints. Would recommend pt continue regular diet with aspiraiton precautions (discussed with pt) and that MD consider assessment for GER and medical management if needed. No SLP f/u needed at this time.  Risk for Aspiration: Mild  Swallow Evaluation Recommendations Recommended Consults: Consider GI evaluation Solid Consistency: Regular Liquid Consistency: Thin Liquid Administration via: Cup;No straw Medication Administration: Whole meds with liquid Supervision: Patient able to self feed Compensations: Slow rate;Small sips/bites Postural Changes and/or Swallow Maneuvers: Seated upright 90 degrees;Upright 30-60 min after meal Oral Care Recommendations: Oral care BID Follow up Recommendations: None  Treatment Plan Treatment Plan Recommendations: No treatment recommended at this time   [facility], MA CCC-SLP [phone]  Claudine Mouton 08/29/2011,12:05 PM

## 2011-08-29 NOTE — Progress Notes (Signed)
08/29/2011 Issa Kosmicki SPARKS Case Management Note. pj  Utilization review completed.

## 2011-08-29 NOTE — Consult Note (Signed)
Subjective: Patient slipped at home transferring from wheelchair to bed and landed on her buttocks. She had the onset of lateral left hip pain. She also had trouble bearing weight and was transported to the emergency room where x-rays and CT scan were consistent with a nondisplaced fracture of the greater trochanter on the left side appear her history is significant in that in September of 2009 she had a left subtrochanteric stroke fracture that was fixed with an intramedullary nail that cut out a month later. She was then converted to a total hip using an ASR cup and an SROM stem and neck she did very well for about a month until she fell again and sustained a fracture of the femur distal to the total hip there was fixed with a poly-asked plate by my partner Dr. Luiz Blare. Since then she has had little pain and has been a household ambulator at best. She was seen today at the bedside and reported that the pain in her left leg is well-controlled with pain medication and she did actually move her hip and her knee without difficulty.  Objective: There is no objective swelling to the left hip although she is tender over the greater trochanter 1-2+. Foot tap is negative. You can load the leg up was about 40 pounds pressure on the foot before she has any discomfort at all. The plain x-rays are reviewed and do not show any evidence of fracture, the CT scan is consistent either with a nondisplaced subacute fracture of the greater trochanter or residual of the old subtrochanteric stroke fracture that never healed completely.  Assessment: Status post slip and fall out of a wheelchair with the acute onset of left lateral hip pain consistent with a possible nondisplaced greater trochanter fracture.  Plan: For now she is weightbearing as tolerated using a walker with direct supervision since she is a high fall risk. She is amenable to short-term nursing home placement so that she can have care and physical therapy for the  next 6-8 weeks will the fracture heals. I would like to see her back in the office in 2-3 weeks for routine x-rays sooner if there is a change in condition. No surgeries indicated.

## 2011-08-29 NOTE — Progress Notes (Signed)
Subjective: PATIENT STATES PAIN MEDICATION CONTROLLING PAIN.  Objective: Vital signs in last 24 hours: Filed Vitals:   08/29/11 0142 08/29/11 0246 08/29/11 0500 08/29/11 0626  BP: 111/56 115/65  101/49  Pulse: 99 95  61  Temp: 99.4 F (37.4 C) 97.3 F (36.3 C)  97.2 F (36.2 C)  TempSrc: Oral     Resp: 15 16  18   Height:   5\' 4"  (1.626 m)   Weight:   74.844 kg (165 lb)   SpO2: 98% 92%      Intake/Output Summary (Last 24 hours) at 08/29/11 1123 Last data filed at 08/29/11 0658  Gross per 24 hour  Intake   1270 ml  Output    600 ml  Net    670 ml    Weight change:   General: Alert, awake, oriented x3, in no acute distress. Heart: Regular rate and rhythm, without murmurs, rubs, gallops. Lungs: Clear to auscultation bilaterally IN ANTERIOR LUNG FIELDS. Abdomen: Soft, nontender, nondistended, positive bowel sounds. Extremities: No clubbing cyanosis or edema with positive pedal pulses.    Lab Results:  [facility] 08/29/11 0715 08/28/11 2320  NA 142 143  K 4.9 3.6  CL 109 116*  CO2 28 19  GLUCOSE 92 71  BUN 33* 29*  CREATININE 1.27* 0.90  CALCIUM 8.9 6.5*  MG 2.0 --  PHOS 3.4 --    Basename 08/29/11 0715  AST 52*  ALT 55*  ALKPHOS 92  BILITOT 0.2*  PROT 6.0  ALBUMIN 2.9*   No results found for this basename: LIPASE:2,AMYLASE:2 in the last 72 hours  Basename 08/29/11 0715 08/28/11 2320  WBC 12.5* 14.7*  NEUTROABS -- 6.3  HGB 12.2 12.4  HCT 38.3 38.0  MCV 93.4 91.3  PLT 160 159   No results found for this basename: CKTOTAL:3,CKMB:3,CKMBINDEX:3,TROPONINI:3 in the last 72 hours No components found with this basename: POCBNP:3 No results found for this basename: DDIMER:2 in the last 72 hours No results found for this basename: HGBA1C:2 in the last 72 hours No results found for this basename: CHOL:2,HDL:2,LDLCALC:2,TRIG:2,CHOLHDL:2,LDLDIRECT:2 in the last 72 hours No results found for this basename: TSH,T4TOTAL,FREET3,T3FREE,THYROIDAB in the last 72  hours No results found for this basename: VITAMINB12:2,FOLATE:2,FERRITIN:2,TIBC:2,IRON:2,RETICCTPCT:2 in the last 72 hours  Micro Results: No results found for this or any previous visit (from the past 240 hour(s)).  Studies/Results: Dg Hip Complete Left  08/28/2011  *RADIOLOGY REPORT*  Clinical Data: Left hip pain  LEFT HIP - COMPLETE 2+ VIEW  Comparison: 01/15/2011  Findings: Left total hip arthroplasty with anatomic alignment.  No breakage or loosening of the hardware.  Side plate in the left femur with cerclage wires are in place.  Osteopenia.  No acute fracture and no dislocation.  IMPRESSION: No acute bony pathology.  Original Report Authenticated By: Donavan Burnet, M.D.   Ct Head Wo Contrast  08/29/2011  *RADIOLOGY REPORT*  Clinical Data: Status post fall  CT CERVICAL SPINE WITHOUT CONTRAST,CT HEAD WITHOUT CONTRAST  Technique:  Multidetector CT imaging of the cervical spine was performed. Multiplanar CT image reconstructions were also generated.,Technique:  Contiguous axial images were obtained from the base of the skull through the vertex without contrast.  Comparison: 01/15/2011 MRI  Findings:  Head:  Left frontal lobe hypoattenuation is similar to prior. Mild hypoattenuation versus volume loss involving the inferior left temporal lobe anteriorly is also unchanged.  Prominence of the sulci, cisterns, and ventricles, in keeping with volume loss. There are subcortical and periventricular white matter hypodensities, a nonspecific finding  most often seen with chronic microangiopathic changes.  There is no evidence for acute hemorrhage, overt hydrocephalus, mass lesion, or abnormal extra-axial fluid collection.  No definite CT evidence for acute cortical based (large artery) infarction. The visualized paranasal sinuses and mastoid air cells are predominately clear. Previous left temporoparietal craniotomy. No displaced calvarial fracture identified.  Cervical spine: Apical scarring.  Maintained  craniocervical relationship.  No acute fracture or dislocation identified.  Facet arthropathy at C2-3.  Atherosclerotic vascular calcification.  IMPRESSION: Left frontal lobe encephalomalacia.  No definite acute intracranial abnormality.  Mild cervical spine degenerative changes.  No acute fracture or dislocation identified.  Original Report Authenticated By: Waneta Martins, M.D.   Ct Cervical Spine Wo Contrast  08/29/2011  *RADIOLOGY REPORT*  Clinical Data: Status post fall  CT CERVICAL SPINE WITHOUT CONTRAST,CT HEAD WITHOUT CONTRAST  Technique:  Multidetector CT imaging of the cervical spine was performed. Multiplanar CT image reconstructions were also generated.,Technique:  Contiguous axial images were obtained from the base of the skull through the vertex without contrast.  Comparison: 01/15/2011 MRI  Findings:  Head:  Left frontal lobe hypoattenuation is similar to prior. Mild hypoattenuation versus volume loss involving the inferior left temporal lobe anteriorly is also unchanged.  Prominence of the sulci, cisterns, and ventricles, in keeping with volume loss. There are subcortical and periventricular white matter hypodensities, a nonspecific finding most often seen with chronic microangiopathic changes.  There is no evidence for acute hemorrhage, overt hydrocephalus, mass lesion, or abnormal extra-axial fluid collection.  No definite CT evidence for acute cortical based (large artery) infarction. The visualized paranasal sinuses and mastoid air cells are predominately clear. Previous left temporoparietal craniotomy. No displaced calvarial fracture identified.  Cervical spine: Apical scarring.  Maintained craniocervical relationship.  No acute fracture or dislocation identified.  Facet arthropathy at C2-3.  Atherosclerotic vascular calcification.  IMPRESSION: Left frontal lobe encephalomalacia.  No definite acute intracranial abnormality.  Mild cervical spine degenerative changes.  No acute fracture or  dislocation identified.  Original Report Authenticated By: Waneta Martins, M.D.   Ct Hip Left Wo Contrast  08/28/2011  *RADIOLOGY REPORT*  Clinical Data: Left hip pain  CT OF THE LEFT HIP WITHOUT CONTRAST  Technique:  Multidetector CT imaging was performed according to the standard protocol. Multiplanar CT image reconstructions were also generated.  Comparison: None.  Findings: A left total hip arthroplasty is in place.  There is a fracture involving the proximal left femur.  The fracture line primarily involves the greater trochanter.  The fracture line extends inferiorly at the lateral proximal femur.  The fracture line does not extend across the shaft of the femur.  The hardware is intact.  Fracture fragments are minimally displaced.  Infrarenal abdominal aortic aneurysm is 4.9 cm in maximal transverse diameter.  It is partially imaged.  IMPRESSION: Acute fracture essentially involving the greater trochanter of the femur.  The fracture does not extend across the shaft of the proximal left femur.  The hardware is intact.  4.9 cm abdominal aortic aneurysm is partially imaged.  Original Report Authenticated By: Donavan Burnet, M.D.    Medications:    . aspirin EC  325 mg Oral Daily  . atorvastatin  80 mg Oral q1800  . calcium gluconate  1 g Intravenous Once  . ezetimibe  10 mg Oral Daily  . imipramine  50 mg Oral QHS  . lipase/protease/amylase  2 capsule Oral TID AC  . loratadine  10 mg Oral Daily  .  morphine injection  4 mg Intravenous Once  . omega-3 acid ethyl esters  1 g Oral Daily  . ondansetron (ZOFRAN) IV  4 mg Intravenous Once  . pregabalin  75 mg Oral BID  . DISCONTD: fish oil-omega-3 fatty acids  2 g Oral BH-q7a    Assessment: Principal Problem:  *Hip fracture Active Problems:  Diarrhea  Fall  Debility  Hypocalcemia  Leukocytosis   Plan: #1 ?? hIP FRACTURE Per ortho PA Dr Turner Daniels has looked at films and feels no acute fracture. Pain management and WBAT. PT/OT. D/C  morphine sulphate.  #2. FALL Mechanical fall.  #3. Dehydration Increase IVF.  #4.Leukocytosis Likely reactive. CXR pending. UA negative. WBC trending down. No need for antibiotics at this time. Follow  #5 Chronic Diarrhea Being f/u as outpatient.  #6. Hypocalcemia Resolved.  #7. Prophylaxis Lovenox for DVT prophylaxis.    LOS: 1 day   [facility] 08/29/2011, 11:23 AM

## 2011-08-29 NOTE — Progress Notes (Signed)
Pt admitted during night shift from home after fall and sustaining L femur trochanteric fx. Pt, per rept, was seen via Dr. Turner Daniels and pt will not be a candidate for surgery. Pt has + cms of RLE. Pt, per rept and per her admission, is wheelchair dependent. Pt told night RN that she was "afraid to ambulate far" due to her history of falls. Pt states that she can stand pivot to wheelchair but stays in wheelchair primarily. Pt is alert and oriented x 3. Pt, per her admission, has a history of "problems swallowing" since she was 79 years old. Pt states, "I was told that I couldn't swallow because I smoked". Pt repts that she is a current smoker. Pt lungs CTA but noted to be diminished in the bases. Pt, per rept, sats in the low 90's on RA. Pt is currently on 2 lpm. Pt repts nonprod cough. No s/sx resp or cardiac distress and no c/o such. Pt heart rate regular rate and rhythm. Vital signs otherwise stable. Pt c/o "excrutiating" pain with movement. Will medicate per order. Pt has no pressure skin areas of heels or sacral area. Pt has a foley draining small amount of pale yellow urine. Pt repts that she has a history of stress incontinence and wears a depend at home. Pt noted to have a red rash of groin area.  MGP BID and prn. Pt has pale and very dry flakey skin worse of BLE. Pt has some cracking of skin of feet and around toes. Pt has faint pulses pedals bilaterally. Pt BLE slight discolored but warm to touch with + cap refill. Pt has a bandaid around her L big toe. Pt states, "that is where the lady that does my toes cut my toe". Pt has episodic bruising of entire body worse of BUE. Pt abdomen is soft flat nontender and nondistended. BS +x4. Pt repts passing gas and denies nausea or vomiting. Pt repts LBM was yesterday and pt repts that she has had diarrhea since January and is seeing a physician regarding such. No diarrhea noted here. Pt is currently bedrest. Pt, per rept, is here for pain control and rehab.

## 2011-08-29 NOTE — ED Notes (Signed)
MD at bedside. Dr. Doutova 

## 2011-08-29 NOTE — Progress Notes (Signed)
PT EVALUATION  08/29/11 1400  PT Visit Information  Last PT Received On 08/29/11  Patient Stated Goals  Goal #1 return to same level of function  Restrictions  Weight Bearing Restrictions Yes  LLE Weight Bearing WBAT  Home Living  Lives With Son  Receives Help From Family  Type of Home Other (Comment) (condo)  Home Layout One level  Home Access Ramped entrance  Bathroom Shower/Tub Tub/shower unit  Bathroom Toilet Handicapped height  Bathroom Accessibility Yes  How Accessible Accessible via walker  Home Adaptive Equipment Walker - rolling;Bedside commode/3-in-1;Wheelchair - manual  Prior Function  Level of Independence Independent with basic ADLs;Independent with transfers;Independent with gait;Requires assistive device for independence;Needs assistance with homemaking;Other (comment) (walks 10 feet into bathroom with RW)  Able to Take Stairs? No  Driving No  Cognition  Arousal/Alertness Awake/alert  Overall Cognitive Status Appears within functional limits for tasks assessed  Orientation Level Oriented X4  Sensation  Light Touch Appears Intact (light touch lower legs; pt. reports neuropathy both feet)  Bed Mobility  Bed Mobility Yes  Supine to Sit 1: +2 Total assist;Patient percentage (comment);HOB elevated (Comment degrees);Other (comment) (pt. 50%)  Supine to Sit Details (indicate cue type and reason) cues for safety, pain control and technique  Sit to Supine 1: +2 Total assist;Patient percentage (comment);HOB elevated (comment degrees);Other (comment) (pt. 50%)  Sit to Supine - Details (indicate cue type and reason) cues for safety and technique  Transfers  Transfers No  Ambulation/Gait  Ambulation/Gait No  RUE Assessment  RUE Assessment Not tested (defer to OT)  LUE Assessment  LUE Assessment Not tested (defer to OT)  RLE Assessment  RLE Assessment WFL  LLE Assessment  LLE Assessment X (pt. able to ankle pump and quad set ; not tested d/t fx.)  Exercises    Exercises General Lower Extremity  General Exercises - Lower Extremity  Ankle Circles/Pumps AROM;Both;10 reps;Supine  PT - End of Session  Activity Tolerance Patient tolerated treatment well (limited by fear of falling)  Patient left in bed;with call bell in reach;Other (comment) (rails up X 4 at pt. request)  Nurse Communication Mobility status for transfers  General  Behavior During Session [facility] for tasks performed  Cognition [facility] for tasks performed  PT Assessment  Clinical Impression Statement Pt. is s/p fall from w/c resulting in left femur fx (nonsurgical), and decreased mobility and transfers.  At this point, expect pt. to need 5x/week PT at SNF level before DC home.  Discussed this with pt. and she is in agreement that this will likely be needed.    PT Recommendation/Assessment Patient will need skilled PT in the acute care venue  PT Problem List Decreased activity tolerance;Decreased mobility;Decreased knowledge of use of DME;Decreased knowledge of precautions  Barriers to Discharge Decreased caregiver support  Barriers to Discharge Comments Son travels and is gone for a week at a time  PT Therapy Diagnosis  Difficulty walking  PT Plan  PT Frequency Min 6X/week  PT Treatment/Interventions DME instruction;Gait training;Functional mobility training;Therapeutic activities;Patient/family education  PT Recommendation  Recommendations for Other Services OT consult  Follow Up Recommendations Skilled nursing facility  Equipment Recommended Defer to next venue  Individuals Consulted  Consulted and Agree with Results and Recommendations Patient  Acute Rehab PT Goals  PT Goal Formulation With patient  Pt will go Supine/Side to Sit with min assist  PT Goal: Supine/Side to Sit - Progress Goal set today  Pt will go Sit to Supine/Side with min assist  PT Goal:  Sit to Supine/Side - Progress Goal set today  Pt will go Sit to Stand with min assist;with upper extremity assist  PT Goal: Sit  to Stand - Progress Goal set today  Pt will go Stand to Sit with min assist;with upper extremity assist  PT Goal: Stand to Sit - Progress Goal set today  Pt will Transfer Bed to Chair/Chair to Bed with min assist  PT Transfer Goal: Bed to Chair/Chair to Bed - Progress Goal set today  Pt will Ambulate 1 - 15 feet;with min assist;with rolling walker  PT Goal: Ambulate - Progress Goal set today   Weldon Picking PT Acute Rehab Services [phone] [place] [phone]

## 2011-08-29 NOTE — ED Provider Notes (Signed)
Medical screening examination/treatment/procedure(s) were conducted as a shared visit with non-physician practitioner(s) and myself.  I personally evaluated the patient during the encounter  Fall while transferring from wheelchair using walker.  L groin and knee pain and has been unable to bear weight.  No parasthesia or additional injury.  Did not strike head  Pain with palpation and manipulation L hip.  Xray negative.  Still with pain.  CT shows acute fx. Discussed with ortho and admit for pain control  Dayton Bailiff, MD 08/29/11 1000

## 2011-08-29 NOTE — H&P (Signed)
PCP:   Lupita Raider, MD, MD    Chief Complaint:   Fall and left hip pain  HPI: [name] is a 78 y.o. female   has a past medical history of Abdominal aneurysm without mention of rupture; Cholelithiasis; Hyperlipidemia; TIA (transient ischemic attack) (~ 09/2010); Neuropathic pain; Osteoporosis; Arthritis; Hypertension (04/30/11); Pneumonia; Migraines; Headache; and Peripheral neuropathy.   Presented with  Fall out of bed when she was trying to transfer to whealchair. Fell down on the floor. Thinks she may have bumped her head. She was not able to stand up due to pain. EMS brought her to ER where she was found to have nondisplaced hip fracture. Her hardware  Int that hip is intact. She had her left hip replaced in July 2009 by Dr. Luiz Blare.   No LOS no chest pain no SOB  Review of Systems:    Pertinent positives include: fall, she has been coughing every time she drinks been like that for 40 years. She has chronic diarrhea since last January.   Constitutional:  No weight loss, night sweats, Fevers, chills, fatigue, weight loss  HEENT:  No headaches, Difficulty swallowing,Tooth/dental problems,Sore throat,  No sneezing, itching, ear ache, nasal congestion, post nasal drip,  Cardio-vascular:  No chest pain, Orthopnea, PND, anasarca, dizziness, palpitations.no Bilateral lower extremity swelling  GI:  No heartburn, indigestion, abdominal pain, nausea, vomiting,  change in bowel habits, loss of appetite, melena, blood in stool, hematoemesis Resp:  no shortness of breath at rest. No dyspnea on exertion, No excess mucus, no productive cough, No non-productive cough, No coughing up of blood.No change in color of mucus.No wheezing. Skin:  no rash or lesions. No jaundice GU:  no dysuria, change in color of urine, no urgency or frequency. No straining to urinate.  No flank pain.  Musculoskeletal:  No joint pain or no joint swelling. No decreased range of motion. No back pain.  Psych:    No change in mood or affect. No depression or anxiety. No memory loss.  Neuro: no localizing neurological complaints, no tingling, no weakness, no double vision, no gait abnormality, no slurred speech, no confusion  Otherwise ROS are negative except for above, 10 systems were reviewed  Past Medical History: Past Medical History  Diagnosis Date  . Abdominal aneurysm without mention of rupture   . Cholelithiasis   . Hyperlipidemia   . TIA (transient ischemic attack) ~ 09/2010    "[name]"  . Neuropathic pain   . Osteoporosis   . Arthritis   . Hypertension 04/30/11    "not a problem now"  . Pneumonia     "once or twice"  . Migraines   . Headache   . Peripheral neuropathy     "hands, feet, legs"   Past Surgical History  Procedure Date  . Carotid endarterectomy 03/1999; 04/1999    bilateral (Dr. Edilia Bo)  . 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 Tonsillectomy     "when I was little"  . Abdominal hysterectomy 1984  . Tubal ligation 1975  . Dilation and curettage of uterus     "I've had a bunch of those"  . Colon surgery 2002    "8 inches of my colon taken out"  . Fracture surgery 02/2008    "femur; put rod & screws in"  . Fracture surgery 03/2008    "replaced screws in femur"  . Fracture surgery 04/2008    "replaced hardware S/P rebreak femur"  Medications: Prior to Admission medications   Medication Sig Start Date End Date Taking? Authorizing Provider  aspirin EC 325 MG tablet Take 325 mg by mouth every morning.   Yes Historical Provider, MD  cetirizine (ZYRTEC ALLERGY) 10 MG tablet Take 10 mg by mouth every morning.    Yes Historical Provider, MD  CREON 24000 UNITS CPEP Take 2 capsules by mouth 3 (three) times daily before meals.  05/26/11  Yes Historical Provider, MD  ezetimibe (ZETIA) 10 MG tablet Take 10 mg by mouth daily.     Yes Historical Provider, MD  fish oil-omega-3 fatty acids 1000 MG capsule Take 2 g by mouth every  morning.   Yes Historical Provider, MD  imipramine (TOFRANIL) 50 MG tablet Take 50 mg by mouth at bedtime.     Yes Historical Provider, MD  pregabalin (LYRICA) 75 MG capsule Take 75 mg by mouth 2 (two) times daily.     Yes Historical Provider, MD  rosuvastatin (CRESTOR) 40 MG tablet Take 40 mg by mouth daily with supper.    Yes Historical Provider, MD    Allergies:   Allergies  Allergen Reactions  . Codeine Rash  . Penicillins Rash    Social History:  Ambulatory only in whealchair Lives at  Home with son   reports that she has been smoking Cigarettes.  She has a 15 pack-year smoking history. She has never used smokeless tobacco. She reports that she does not drink alcohol or use illicit drugs.   Family History: family history includes Aneurysm in her father; Diabetes in her brother and mother; Heart disease in her mother; Hyperlipidemia in her mother; and Hypertension in her mother.    Physical Exam: Patient Vitals for the past 24 hrs:  BP Temp Temp src Pulse Resp SpO2  08/29/11 0142 111/56 mmHg 99.4 F (37.4 C) Oral 99  15  98 %  08/28/11 1915 114/60 mmHg - - 94  19  93 %  08/28/11 1859 - - - - - 100 %  08/28/11 1856 128/76 mmHg 98.2 F (36.8 C) Oral 94  18  -    1. General:  in No Acute distress 2. Psychological: Alert and  Oriented 3. Head/ENT:   Moist  Mucous Membranes                          Head Non traumatic, neck supple                          Normal Poor Dentition 4. SKIN: normal  Skin turgor,  Skin clean Dry and intact no rash 5. Heart: Regular rate and rhythm no Murmur, Rub or gallop 6. Lungs: Clear to auscultation bilaterally, no wheezes or crackles   7. Abdomen: Soft, non-tender, Non distended 8. Lower extremities: no clubbing, cyanosis, or edema 9. Neurologically Grossly intact, moving all 4 extremities equally 10. MSK: Normal range of motion except due to pain in the hip  body mass index is unknown because there is no height or weight on  file.   Labs on Admission:   [facility] 08/28/11 2320  NA 143  K 3.6  CL 116*  CO2 19  GLUCOSE 71  BUN 29*  CREATININE 0.90  CALCIUM 6.5*  MG --  PHOS --   No results found for this basename: AST:2,ALT:2,ALKPHOS:2,BILITOT:2,PROT:2,ALBUMIN:2 in the last 72 hours No results found for this basename: LIPASE:2,AMYLASE:2 in the last 72 hours  Basename 08/28/11  2320  WBC 14.7*  NEUTROABS 6.3  HGB 12.4  HCT 38.0  MCV 91.3  PLT 159   No results found for this basename: CKTOTAL:3,CKMB:3,CKMBINDEX:3,TROPONINI:3 in the last 72 hours No results found for this basename: TSH,T4TOTAL,FREET3,T3FREE,THYROIDAB in the last 72 hours No results found for this basename: VITAMINB12:2,FOLATE:2,FERRITIN:2,TIBC:2,IRON:2,RETICCTPCT:2 in the last 72 hours Lab Results  Component Value Date   HGBA1C 6.0* 01/16/2011    The CrCl is unknown because both a height and weight (above a minimum accepted value) are required for this calculation. ABG    Component Value Date/Time   TCO2 28 06/30/2011 0852     Lab Results  Component Value Date   DDIMER 1.55* 02/26/2011      Cultures:    Component Value Date/Time   SDES BLOOD RIGHT ARM 02/25/2011 2340   SPECREQUEST BOTTLES DRAWN AEROBIC AND ANAEROBIC 10CC 02/25/2011 2340   CULT NO GROWTH 5 DAYS 02/25/2011 2340   REPTSTATUS 03/04/2011 FINAL 02/25/2011 2340       Radiological Exams on Admission: Dg Hip Complete Left  08/28/2011  *RADIOLOGY REPORT*  Clinical Data: Left hip pain  LEFT HIP - COMPLETE 2+ VIEW  Comparison: 01/15/2011  Findings: Left total hip arthroplasty with anatomic alignment.  No breakage or loosening of the hardware.  Side plate in the left femur with cerclage wires are in place.  Osteopenia.  No acute fracture and no dislocation.  IMPRESSION: No acute bony pathology.  Original Report Authenticated By: Donavan Burnet, M.D.   Ct Hip Left Wo Contrast  08/28/2011  *RADIOLOGY REPORT*  Clinical Data: Left hip pain  CT OF THE LEFT HIP WITHOUT  CONTRAST  Technique:  Multidetector CT imaging was performed according to the standard protocol. Multiplanar CT image reconstructions were also generated.  Comparison: None.  Findings: A left total hip arthroplasty is in place.  There is a fracture involving the proximal left femur.  The fracture line primarily involves the greater trochanter.  The fracture line extends inferiorly at the lateral proximal femur.  The fracture line does not extend across the shaft of the femur.  The hardware is intact.  Fracture fragments are minimally displaced.  Infrarenal abdominal aortic aneurysm is 4.9 cm in maximal transverse diameter.  It is partially imaged.  IMPRESSION: Acute fracture essentially involving the greater trochanter of the femur.  The fracture does not extend across the shaft of the proximal left femur.  The hardware is intact.  4.9 cm abdominal aortic aneurysm is partially imaged.  Original Report Authenticated By: Donavan Burnet, M.D.    Assessment/Plan  78 yo F with Left hip fracture admitted for pain management  Present on Admission:   .Hip fracture -pain management and orthopedics consult .Debility - PT/OT evaluation and possible placement .Hypocalcemia - will repelace .Leukocytosis - likely reactive    Prophylaxis: SCD   CODE STATUS: DNR/DNI  I have spent a total of 55 min on this admition  [name] 08/29/2011, 1:53 AM

## 2011-08-30 LAB — CBC
HCT: 33.9 % — ABNORMAL LOW (ref 36.0–46.0)
MCH: 29.3 pg (ref 26.0–34.0)
MCV: 92.9 fL (ref 78.0–100.0)
RBC: 3.65 MIL/uL — ABNORMAL LOW (ref 3.87–5.11)
WBC: 10.5 10*3/uL (ref 4.0–10.5)

## 2011-08-30 LAB — BASIC METABOLIC PANEL
BUN: 22 mg/dL (ref 6–23)
CO2: 25 mEq/L (ref 19–32)
Calcium: 8.4 mg/dL (ref 8.4–10.5)
Chloride: 105 mEq/L (ref 96–112)
Creatinine, Ser: 1.13 mg/dL — ABNORMAL HIGH (ref 0.50–1.10)

## 2011-08-30 MED ORDER — POLYETHYLENE GLYCOL 3350 17 G PO PACK
17.0000 g | PACK | Freq: Every day | ORAL | Status: DC | PRN
Start: 2011-08-30 — End: 2011-09-02

## 2011-08-30 MED ORDER — DOCUSATE SODIUM 100 MG PO CAPS
100.0000 mg | ORAL_CAPSULE | Freq: Two times a day (BID) | ORAL | Status: DC
  Administered 2011-08-31 (×2): 100 mg via ORAL
  Filled 2011-08-30 (×5): qty 1

## 2011-08-30 NOTE — Progress Notes (Signed)
Subjective: PATIENT STATES PAIN MEDICATION CONTROLLING PAIN. No complaints  Objective: Vital signs in last 24 hours: Filed Vitals:   08/29/11 1436 08/29/11 2200 08/30/11 0709 08/30/11 1422  BP: 123/60 136/76 114/64 179/49  Pulse: 86 81 78 87  Temp: 98 F (36.7 C) 99 F (37.2 C) 98.7 F (37.1 C) 98 F (36.7 C)  TempSrc:  Oral  Tympanic  Resp: 16 16 18 18   Height:      Weight:      SpO2: 97% 99% 94% 94%    Intake/Output Summary (Last 24 hours) at 08/30/11 1705 Last data filed at 08/30/11 1300  Gross per 24 hour  Intake   1430 ml  Output    550 ml  Net    880 ml    Weight change:   General: Alert, awake, oriented x3, in no acute distress. Heart: Regular rate and rhythm, without murmurs, rubs, gallops. Lungs: Clear to auscultation bilaterally IN ANTERIOR LUNG FIELDS. Abdomen: Soft, nontender, nondistended, positive bowel sounds. Extremities: No clubbing cyanosis or edema with positive pedal pulses.    Lab Results:  [facility] 08/30/11 0626 08/29/11 0715  NA 138 142  K 4.6 4.9  CL 105 109  CO2 25 28  GLUCOSE 80 92  BUN 22 33*  CREATININE 1.13* 1.27*  CALCIUM 8.4 8.9  MG -- 2.0  PHOS -- 3.4    Basename 08/29/11 0715  AST 52*  ALT 55*  ALKPHOS 92  BILITOT 0.2*  PROT 6.0  ALBUMIN 2.9*   No results found for this basename: LIPASE:2,AMYLASE:2 in the last 72 hours  Basename 08/30/11 0626 08/29/11 0715 08/28/11 2320  WBC 10.5 12.5* --  NEUTROABS -- -- 6.3  HGB 10.7* 12.2 --  HCT 33.9* 38.3 --  MCV 92.9 93.4 --  PLT 144* 160 --   No results found for this basename: CKTOTAL:3,CKMB:3,CKMBINDEX:3,TROPONINI:3 in the last 72 hours No components found with this basename: POCBNP:3 No results found for this basename: DDIMER:2 in the last 72 hours No results found for this basename: HGBA1C:2 in the last 72 hours No results found for this basename: CHOL:2,HDL:2,LDLCALC:2,TRIG:2,CHOLHDL:2,LDLDIRECT:2 in the last 72 hours  Basename 08/29/11 0715  TSH 2.235    T4TOTAL --  T3FREE --  THYROIDAB --   No results found for this basename: VITAMINB12:2,FOLATE:2,FERRITIN:2,TIBC:2,IRON:2,RETICCTPCT:2 in the last 72 hours  Micro Results: No results found for this or any previous visit (from the past 240 hour(s)).  Studies/Results: X-ray Chest Pa And Lateral   08/29/2011  *RADIOLOGY REPORT*  Clinical Data: Cough, shortness of breath, and chest pain.  CHEST - 2 VIEW  Comparison: 09/13 and 01/15/2011  Findings: Heart size and vascularity are normal and the lungs are clear.  No osseous abnormality.  IMPRESSION: Normal chest.  Original Report Authenticated By: Gwynn Burly, M.D.   Dg Hip Complete Left  08/28/2011  *RADIOLOGY REPORT*  Clinical Data: Left hip pain  LEFT HIP - COMPLETE 2+ VIEW  Comparison: 01/15/2011  Findings: Left total hip arthroplasty with anatomic alignment.  No breakage or loosening of the hardware.  Side plate in the left femur with cerclage wires are in place.  Osteopenia.  No acute fracture and no dislocation.  IMPRESSION: No acute bony pathology.  Original Report Authenticated By: Donavan Burnet, M.D.   Ct Head Wo Contrast  08/29/2011  *RADIOLOGY REPORT*  Clinical Data: Status post fall  CT CERVICAL SPINE WITHOUT CONTRAST,CT HEAD WITHOUT CONTRAST  Technique:  Multidetector CT imaging of the cervical spine was performed. Multiplanar CT image  reconstructions were also generated.,Technique:  Contiguous axial images were obtained from the base of the skull through the vertex without contrast.  Comparison: 01/15/2011 MRI  Findings:  Head:  Left frontal lobe hypoattenuation is similar to prior. Mild hypoattenuation versus volume loss involving the inferior left temporal lobe anteriorly is also unchanged.  Prominence of the sulci, cisterns, and ventricles, in keeping with volume loss. There are subcortical and periventricular white matter hypodensities, a nonspecific finding most often seen with chronic microangiopathic changes.  There is no  evidence for acute hemorrhage, overt hydrocephalus, mass lesion, or abnormal extra-axial fluid collection.  No definite CT evidence for acute cortical based (large artery) infarction. The visualized paranasal sinuses and mastoid air cells are predominately clear. Previous left temporoparietal craniotomy. No displaced calvarial fracture identified.  Cervical spine: Apical scarring.  Maintained craniocervical relationship.  No acute fracture or dislocation identified.  Facet arthropathy at C2-3.  Atherosclerotic vascular calcification.  IMPRESSION: Left frontal lobe encephalomalacia.  No definite acute intracranial abnormality.  Mild cervical spine degenerative changes.  No acute fracture or dislocation identified.  Original Report Authenticated By: Waneta Martins, M.D.   Ct Cervical Spine Wo Contrast  08/29/2011  *RADIOLOGY REPORT*  Clinical Data: Status post fall  CT CERVICAL SPINE WITHOUT CONTRAST,CT HEAD WITHOUT CONTRAST  Technique:  Multidetector CT imaging of the cervical spine was performed. Multiplanar CT image reconstructions were also generated.,Technique:  Contiguous axial images were obtained from the base of the skull through the vertex without contrast.  Comparison: 01/15/2011 MRI  Findings:  Head:  Left frontal lobe hypoattenuation is similar to prior. Mild hypoattenuation versus volume loss involving the inferior left temporal lobe anteriorly is also unchanged.  Prominence of the sulci, cisterns, and ventricles, in keeping with volume loss. There are subcortical and periventricular white matter hypodensities, a nonspecific finding most often seen with chronic microangiopathic changes.  There is no evidence for acute hemorrhage, overt hydrocephalus, mass lesion, or abnormal extra-axial fluid collection.  No definite CT evidence for acute cortical based (large artery) infarction. The visualized paranasal sinuses and mastoid air cells are predominately clear. Previous left temporoparietal  craniotomy. No displaced calvarial fracture identified.  Cervical spine: Apical scarring.  Maintained craniocervical relationship.  No acute fracture or dislocation identified.  Facet arthropathy at C2-3.  Atherosclerotic vascular calcification.  IMPRESSION: Left frontal lobe encephalomalacia.  No definite acute intracranial abnormality.  Mild cervical spine degenerative changes.  No acute fracture or dislocation identified.  Original Report Authenticated By: Waneta Martins, M.D.   Ct Hip Left Wo Contrast  08/28/2011  *RADIOLOGY REPORT*  Clinical Data: Left hip pain  CT OF THE LEFT HIP WITHOUT CONTRAST  Technique:  Multidetector CT imaging was performed according to the standard protocol. Multiplanar CT image reconstructions were also generated.  Comparison: None.  Findings: A left total hip arthroplasty is in place.  There is a fracture involving the proximal left femur.  The fracture line primarily involves the greater trochanter.  The fracture line extends inferiorly at the lateral proximal femur.  The fracture line does not extend across the shaft of the femur.  The hardware is intact.  Fracture fragments are minimally displaced.  Infrarenal abdominal aortic aneurysm is 4.9 cm in maximal transverse diameter.  It is partially imaged.  IMPRESSION: Acute fracture essentially involving the greater trochanter of the femur.  The fracture does not extend across the shaft of the proximal left femur.  The hardware is intact.  4.9 cm abdominal aortic aneurysm is partially imaged.  Original  Report Authenticated By: Donavan Burnet, M.D.    Medications:    . aspirin EC  325 mg Oral Daily  . atorvastatin  80 mg Oral q1800  . docusate sodium  100 mg Oral BID  . ezetimibe  10 mg Oral Daily  . imipramine  50 mg Oral QHS  . lipase/protease/amylase  2 capsule Oral TID AC  . loratadine  10 mg Oral Daily  . omega-3 acid ethyl esters  1 g Oral Daily  . pregabalin  75 mg Oral BID    Assessment: Principal  Problem:  *Hip fracture Active Problems:  Diarrhea  Fall  Debility  Hypocalcemia  Leukocytosis   Plan: #1 Probable non displaced greater trochanter fracture/ hIP FRACTURE WBAT. PT/OT. Will need SNF. Pain management. Supportive care.  #2. FALL Mechanical fall.  #3. Dehydration IVF.  #4.Leukocytosis Likely reactive. CXR pending. UA negative. WBC trending down. No need for antibiotics at this time. Follow  #5 Chronic Diarrhea Being f/u as outpatient.  #6. Hypocalcemia Resolved.  #7. Prophylaxis Lovenox for DVT prophylaxis.    LOS: 2 days   [facility] 08/30/2011, 5:05 PM

## 2011-08-30 NOTE — Progress Notes (Signed)
Physical Therapy Treatment Patient Details Name: [name] MRN: [id number] DOB: [birth year] Today's Date: 08/30/2011  PT Assessment/Plan  PT - Assessment/Plan Comments on Treatment Session: Increased activity today with out of bed transfer. Pt became very anxious and fearfull with out of bed transfer, repeatedly stating "I can't do this". Max encouragement provided and total assist needed for safety. Once seated safely in recliner pt's anxiety decreased. Co-treatement with OT, see OT evaluation for additional information.                PT Plan: Discharge plan remains appropriate;Frequency remains appropriate PT Frequency: Min 6X/week Follow Up Recommendations: Skilled nursing facility Equipment Recommended: Defer to next venue PT Goals  Acute Rehab PT Goals PT Goal: Supine/Side to Sit - Progress: Progressing toward goal PT Goal: Sit to Stand - Progress: Progressing toward goal PT Goal: Stand to Sit - Progress: Progressing toward goal PT Transfer Goal: Bed to Chair/Chair to Bed - Progress: Progressing toward goal PT Goal: Ambulate - Progress: Progressing toward goal  PT Treatment Precautions/Restrictions  Restrictions Weight Bearing Restrictions: Yes LLE Weight Bearing: Weight bearing as tolerated Mobility (including Balance) Bed Mobility Supine to Sit: 1: +2 Total assist;HOB elevated (Comment degrees);With rails (pt= 40%. ) Supine to Sit Details (indicate cue type and reason): HOB approx 45*. Assist to move legs to edge of bed and then off bed and assist for trunk elevation into sitting position. cues for technique to achieve these positions. assist needed for balance with scooting to edge of bed to prevent posterior loss of balance and  cues for ant wt shifting to assit with scoot.      Transfers Transfers: Yes Sit to Stand: 1: +2 Total assist;From bed;With upper extremity assist (pt= 40%) Sit to Stand Details (indicate cue type and reason): mod cues for hand placement.  cues/facilitiation to achieve standing, for upright posture once standing and for ant wt shift to assist with standing. Stand to Sit: 1: +2 Total assist;To chair/3-in-1;Without upper extremity assist (pt = < 10%) Stand to Sit Details: max cues to back all the way to surface and reach back. pt not following commands toward end of stand-pivot transfer, both feet off ground with pt holding onto walker, total assist of 2 to lower safely into recliner. Stand Pivot Transfers: 1: +2 Total assist;Other (comment) (pt= 20-25%. RW used.) Print production planner Details (indicate cue type and reason): max cues/facilitation for upright posture, lower extremity advancement toward chair and for RW management. pt required assist to move left leg x1 step, then NWB rest of transfer. pt self advanced rigth leg 1-2 steps before going NWB on right leg. Dependent to total assist +2 complete transfer safely to recliner.                           Posture/Postural Control Posture/Postural Control: No significant limitations Exercise  Total Joint Exercises Ankle Circles/Pumps: AROM;Both;10 reps;Supine Quad Sets: AROM;Strengthening;Both;10 reps;Supine End of Session PT - End of Session Equipment Utilized During Treatment: Gait belt Activity Tolerance: Patient limited by pain Patient left: in chair;with call bell in reach Nurse Communication: Mobility status for transfers;Mobility status for ambulation;Need for lift equipment;Weight bearing status General Behavior During Session: [facility] for tasks performed (fearfull/anxious with transfer, otherwise [facility]) Cognition: WFL for tasks performed  Sallyanne Kuster 08/30/2011, 11:52 AM  Sallyanne Kuster, PTA Office- [phone] Pager- [phone]

## 2011-08-30 NOTE — Evaluation (Signed)
Occupational Therapy Evaluation Patient Details Name: [name] MRN: [id number] DOB: [birth year] Today's Date: 08/30/2011 10:36-11:04  evII Problem List:  Patient Active Problem List  Diagnoses  . Rectal bleeding  . Diarrhea  . Dehydration  . Dyslipidemia  . History of transient ischemic attack (TIA)  . Neuropathy  . Abdominal aneurysm without mention of rupture  . Carotid stenosis, bilateral  . Occlusion and stenosis of carotid artery without mention of cerebral infarction  . Fall  . Hip fracture  . Debility  . Hypocalcemia  . Leukocytosis    Past Medical History:  Past Medical History  Diagnosis Date  . Abdominal aneurysm without mention of rupture   . Cholelithiasis   . Hyperlipidemia   . TIA (transient ischemic attack) ~ 09/2010    "[name]"  . Neuropathic pain   . Osteoporosis   . Arthritis   . Hypertension 04/30/11    "not a problem now"  . Pneumonia     "once or twice"  . Migraines   . Headache   . Peripheral neuropathy     "hands, feet, legs"   Past Surgical History:  Past Surgical History  Procedure Date  . Carotid endarterectomy 03/1999; 04/1999    bilateral (Dr. Edilia Bo)  . 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 Tonsillectomy     "when I was little"  . Abdominal hysterectomy 1984  . Tubal ligation 1975  . Dilation and curettage of uterus     "I've had a bunch of those"  . Colon surgery 2002    "8 inches of my colon taken out"  . Fracture surgery 02/2008    "femur; put rod & screws in"  . Fracture surgery 03/2008    "replaced screws in femur"  . Fracture surgery 04/2008    "replaced hardware S/P rebreak femur"    OT Assessment/Plan/Recommendation OT Assessment Clinical Impression Statement: 78 yr old female admitted with new left greater trochanter hip fracture secondary to fall.  Currently presents with incresed dependence with all basic selfcare tasks.  Will benefit from acute OT services to  address these current deficts and increase overall independence.  Feel pt will need extensive SNF level rehab for follow-up. OT Recommendation/Assessment: Patient will need skilled OT in the acute care venue OT Problem List: Decreased strength;Decreased activity tolerance;Impaired balance (sitting and/or standing);Pain;Decreased knowledge of use of DME or AE Barriers to Discharge: Decreased caregiver support OT Therapy Diagnosis : Generalized weakness;Acute pain OT Plan OT Frequency: Min 1X/week OT Treatment/Interventions: Self-care/ADL training;DME and/or AE instruction;Balance training;Patient/family education;Therapeutic activities OT Recommendation Follow Up Recommendations: Skilled nursing facility Equipment Recommended: Defer to next venue Individuals Consulted Consulted and Agree with Results and Recommendations: Patient OT Goals Acute Rehab OT Goals OT Goal Formulation: With patient Time For Goal Achievement: 2 weeks ADL Goals Pt Will Perform Lower Body Bathing: with min assist;Sit to stand from bed;with adaptive equipment ADL Goal: Lower Body Bathing - Progress: Goal set today Pt Will Perform Lower Body Dressing: with min assist;Sit to stand from bed;with adaptive equipment ADL Goal: Lower Body Dressing - Progress: Goal set today Pt Will Transfer to Toilet: with min assist;with DME;3-in-1 ADL Goal: Toilet Transfer - Progress: Goal set today Pt Will Perform Toileting - Clothing Manipulation: with min assist;Sitting on 3-in-1 or toilet;Standing ADL Goal: Toileting - Clothing Manipulation - Progress: Goal set today Pt Will Perform Toileting - Hygiene: Sit to stand from 3-in-1/toilet ADL Goal: Toileting - Hygiene - Progress:  Goal set today  OT Evaluation Precautions/Restrictions  Precautions Precautions: Fall Required Braces or Orthoses: No Restrictions Weight Bearing Restrictions: No LLE Weight Bearing: Weight bearing as tolerated Prior Functioning Home Living Lives With:  Sheran Spine Help From: Family Type of Home: Other (Comment) (condo) Home Layout: One level Home Access: Ramped entrance Bathroom Shower/Tub: Engineer, manufacturing systems: Handicapped height Bathroom Accessibility: Yes How Accessible: Accessible via walker Home Adaptive Equipment: Walker - rolling;Bedside commode/3-in-1;Wheelchair - manual Prior Function Level of Independence: Independent with basic ADLs;Independent with transfers;Independent with gait;Requires assistive device for independence;Needs assistance with homemaking;Other (comment) (walks 10 feet into bathroom with RW) Driving: No Vocation: Retired ADL ADL Eating/Feeding: Simulated;Set up Where Assessed - Eating/Feeding: Chair Grooming: Simulated;Set up Where Assessed - Grooming: Sitting, chair Upper Body Bathing: Simulated;Set up Where Assessed - Upper Body Bathing: Sitting, chair Lower Body Bathing: Simulated;+2 Total assistance Lower Body Bathing Details (indicate cue type and reason): pt 40 % Where Assessed - Lower Body Bathing: Sit to stand from bed Upper Body Dressing: Simulated;Set up Where Assessed - Upper Body Dressing: Sitting, bed;Unsupported Lower Body Dressing: Simulated;+2 Total assistance Lower Body Dressing Details (indicate cue type and reason): pt 40% Where Assessed - Lower Body Dressing: Sit to stand from bed Toilet Transfer: Simulated;+2 Total assistance Toilet Transfer Details (indicate cue type and reason): pt 25 % Toilet Transfer Method: Stand pivot Toilet Transfer Equipment: Other (comment) (to bedside chair, pt declined need to toilet.) Toileting - Clothing Manipulation: Simulated;+2 Total assistance Toileting - Clothing Manipulation Details (indicate cue type and reason): pt 40% Where Assessed - Toileting Clothing Manipulation: Sit to stand from 3-in-1 or toilet Toileting - Hygiene: Simulated;+2 Total assistance Toileting - Hygiene Details (indicate cue type and reason): pt 40%  Where  Assessed - Toileting Hygiene: Sit to stand from 3-in-1 or toilet Tub/Shower Transfer: Not assessed Tub/Shower Transfer Method: Not assessed Equipment Used: Rolling walker Ambulation Related to ADLs: Pt currently unable to ambulate just performed stand pivot transfer to bedside chair.  Pt in too much pain and states "I'm not going to walk". ADL Comments: Pt overall needs total assist +2 for all mobility and selfcare.  Unable to bend secondary to still adhering to ther older THR precautions from her previous hip replacement.  Pt reports that she used AE for LB selfcare before or her son would occassionaly assist. Vision/Perception  Vision - History Baseline Vision: No visual deficits Patient Visual Report: No change from baseline Perception Perception: Within Functional Limits Praxis Praxis: Intact Cognition Cognition Arousal/Alertness: Awake/alert Overall Cognitive Status: Appears within functional limits for tasks assessed Orientation Level: Oriented X4 Sensation/Coordination Sensation Light Touch: Appears Intact (in bilateral UEs) Stereognosis: Not tested Hot/Cold: Not tested Proprioception: Not tested Coordination Gross Motor Movements are Fluid and Coordinated: No Fine Motor Movements are Fluid and Coordinated: No Coordination and Movement Description: Pt with noted bilateral dysmetria of unknown cause.  Reports having difficulty with fastening and buttoning. Extremity Assessment RUE Assessment RUE Assessment: Exceptions to [facility] RUE AROM (degrees) RUE Overall AROM Comments: Pt with overall strength of 4/5 throughout but noted decreased gross motor and fine motor coordination with finger to nose testing. LUE Assessment LUE Assessment: Exceptions to [facility] LUE AROM (degrees) LUE Overall AROM Comments: Pt with overall strength of 4/5 throughout but noted decreased gross motor and fine motor coordination with finger to nose testing. Mobility  Bed Mobility Bed Mobility: Yes Supine  to Sit: 1: +2 Total assist;With rails Supine to Sit Details (indicate cue type and reason): total +2 pt 40 %  Transfers Transfers: Yes Sit to Stand: From bed;With upper extremity assist;1: +2 Total assist Sit to Stand Details (indicate cue type and reason): Pt 40 %  End of Session OT - End of Session Equipment Utilized During Treatment: Gait belt Activity Tolerance: Patient limited by pain Patient left: in chair;with call bell in reach General Behavior During Session: [facility] for tasks performed Cognition: [facility] for tasks performed   Tammatha Cobb OTR/L 08/30/2011, 5:06 PM  Pager number [phone]

## 2011-08-31 NOTE — Progress Notes (Addendum)
Clinical Social Work Department CLINICAL SOCIAL WORK PLACEMENT NOTE 08/31/2011  Patient:  LUISA, LOUK  Account Number:  [id number] Admit date:  08/28/2011  Clinical Social Worker:  Jacelyn Grip  Date/time:  08/31/2011 12:53 PM  Clinical Social Work is seeking post-discharge placement for this patient at the following level of care:      (*CSW will update this form in Epic as items are completed)   08/31/2011  Patient/family provided with [facility] Department of Clinical Social Work's list of facilities offering this level of care within the geographic area requested by the patient (or if unable, by the patient's family).  08/31/2011  Patient/family informed of their freedom to choose among providers that offer the needed level of care, that participate in Medicare, Medicaid or managed care program needed by the patient, have an available bed and are willing to accept the patient.  08/31/2011  Patient/family informed of [facility]' ownership interest in [facility], as well as of the fact that they are under no obligation to receive care at this facility.  PASARR submitted to EDS on 08/31/2011 PASARR number received from EDS on 08/31/2011  FL2 transmitted to all facilities in geographic area requested by pt/family on  08/31/2011 FL2 transmitted to all facilities within larger geographic area on   Patient informed that his/her managed care company has contracts with or will negotiate with  certain facilities, including the following:     Patient/family informed of bed offers received: 09/02/2011  Patient chooses bed at [facility] Physician recommends and patient chooses bed at    Patient to be transferred to  on  [facility] on 09/02/2011 Patient to be transferred to facility by ambulance  The following physician request were entered in Epic:   Additional  Comments:     Jacklynn Lewis, MSW, [facility]  Clinical Social Work [phone]

## 2011-08-31 NOTE — Progress Notes (Signed)
Subjective:  No complaints  Objective: Vital signs in last 24 hours: Filed Vitals:   08/30/11 0709 08/30/11 1422 08/30/11 2134 08/31/11 0703  BP: 114/64 179/49 165/70 148/68  Pulse: 78 87 87 85  Temp: 98.7 F (37.1 C) 98 F (36.7 C) 99.1 F (37.3 C) 99.1 F (37.3 C)  TempSrc:  Tympanic    Resp: 18 18 18 18   Height:      Weight:      SpO2: 94% 94% 99% 99%    Intake/Output Summary (Last 24 hours) at 08/31/11 1117 Last data filed at 08/31/11 0900  Gross per 24 hour  Intake   2820 ml  Output   1600 ml  Net   1220 ml    Weight change:   General: Alert, awake, oriented x3, in no acute distress. Heart: Regular rate and rhythm, without murmurs, rubs, gallops. Lungs: Clear to auscultation bilaterally IN ANTERIOR LUNG FIELDS. Abdomen: Soft, nontender, nondistended, positive bowel sounds. Extremities: No clubbing cyanosis or edema with positive pedal pulses.    Lab Results:  [facility] 08/30/11 0626 08/29/11 0715  NA 138 142  K 4.6 4.9  CL 105 109  CO2 25 28  GLUCOSE 80 92  BUN 22 33*  CREATININE 1.13* 1.27*  CALCIUM 8.4 8.9  MG -- 2.0  PHOS -- 3.4    Basename 08/29/11 0715  AST 52*  ALT 55*  ALKPHOS 92  BILITOT 0.2*  PROT 6.0  ALBUMIN 2.9*   No results found for this basename: LIPASE:2,AMYLASE:2 in the last 72 hours  Basename 08/30/11 0626 08/29/11 0715 08/28/11 2320  WBC 10.5 12.5* --  NEUTROABS -- -- 6.3  HGB 10.7* 12.2 --  HCT 33.9* 38.3 --  MCV 92.9 93.4 --  PLT 144* 160 --   No results found for this basename: CKTOTAL:3,CKMB:3,CKMBINDEX:3,TROPONINI:3 in the last 72 hours No components found with this basename: POCBNP:3 No results found for this basename: DDIMER:2 in the last 72 hours No results found for this basename: HGBA1C:2 in the last 72 hours No results found for this basename: CHOL:2,HDL:2,LDLCALC:2,TRIG:2,CHOLHDL:2,LDLDIRECT:2 in the last 72 hours  Basename 08/29/11 0715  TSH 2.235  T4TOTAL --  T3FREE --  THYROIDAB --   No results  found for this basename: VITAMINB12:2,FOLATE:2,FERRITIN:2,TIBC:2,IRON:2,RETICCTPCT:2 in the last 72 hours  Micro Results: No results found for this or any previous visit (from the past 240 hour(s)).  Studies/Results: No results found.  Medications:    . aspirin EC  325 mg Oral Daily  . atorvastatin  80 mg Oral q1800  . docusate sodium  100 mg Oral BID  . ezetimibe  10 mg Oral Daily  . imipramine  50 mg Oral QHS  . lipase/protease/amylase  2 capsule Oral TID AC  . loratadine  10 mg Oral Daily  . omega-3 acid ethyl esters  1 g Oral Daily  . pregabalin  75 mg Oral BID    Assessment: Principal Problem:  *Hip fracture Active Problems:  Diarrhea  Fall  Debility  Hypocalcemia  Leukocytosis   Plan: #1 Probable non displaced greater trochanter fracture/ hIP FRACTURE WBAT. PT/OT. Will need SNF. Pain management. Supportive care.  #2. FALL Mechanical fall.  #3. Dehydration IVF.  #4.Leukocytosis Likely reactive. CXR pending. UA negative. WBC trending down daily. No need for antibiotics at this time. Follow  #5 Chronic Diarrhea Being f/u as outpatient.  #6. Hypocalcemia Resolved.  #7. Prophylaxis Lovenox for DVT prophylaxis.    LOS: 3 days   Mileena Rothenberger 08/31/2011, 11:17 AM

## 2011-08-31 NOTE — Progress Notes (Signed)
Clinical Social Work Department BRIEF PSYCHOSOCIAL ASSESSMENT 08/31/2011  Patient:  [name], [name]     Account Number:  [id number]     Admit date:  08/28/2011  Clinical Social Worker:  Jacelyn Grip  Date/Time:  08/31/2011 12:30 PM  Referred by:  Physician  Date Referred:  08/31/2011 Referred for  SNF Placement   Other Referral:   Interview type:  Patient Other interview type:    PSYCHOSOCIAL DATA Living Status:  WITH ADULT CHILDREN Admitted from facility:   Level of care:   Primary support name:  Thayer Ohm Grogan/son/[phone] Primary support relationship to patient:  CHILD, ADULT Degree of support available:   strong, pt lived with pt son prior to [facility]    CURRENT CONCERNS Current Concerns  Post-Acute Placement   Other Concerns:    SOCIAL WORK ASSESSMENT / PLAN CSW met with pt at bedside to discuss pt discharge needs. Discussed recommendation for SNF at discharge for rehabilitation. Pt agreeable to SNF search in [facility]. Pt requested CSW contact pt son. CSW contacted pt son and discussed discharge plans. CSW completed FL-2 and initiated SNF search in [place]. CSW to follow up with pt and pt son in regard to bed offers. CSW to facilitate pt discharge needs when pt medically stable for discharge.   Assessment/plan status:  Other - See comment Other assessment/ plan:   discharge planning   Information/referral to community resources:   [facility] list    PATIENT'S/FAMILY'S RESPONSE TO PLAN OF CARE: Pt alert and oriented and pleasant. Pt son supportive and actively involved in pt care. Pt son reports pt and pt son are in process of applying for Medicaid as they anticipate pt will need some type of placement long term rather that be in a SNF or ALF because per son report, it has become difficult caring for pt at home.        Jacklynn Lewis, MSW, LCSWA  Clinical Social Work [phone]

## 2011-09-01 LAB — CBC
HCT: 30.2 % — ABNORMAL LOW (ref 36.0–46.0)
MCH: 29.1 pg (ref 26.0–34.0)
MCHC: 31.8 g/dL (ref 30.0–36.0)
MCV: 91.5 fL (ref 78.0–100.0)
Platelets: 147 10*3/uL — ABNORMAL LOW (ref 150–400)
RDW: 14.9 % (ref 11.5–15.5)

## 2011-09-01 LAB — BASIC METABOLIC PANEL
BUN: 18 mg/dL (ref 6–23)
Calcium: 8.5 mg/dL (ref 8.4–10.5)
Creatinine, Ser: 1.08 mg/dL (ref 0.50–1.10)
GFR calc Af Amer: 57 mL/min — ABNORMAL LOW (ref >90)
GFR calc non Af Amer: 49 mL/min — ABNORMAL LOW (ref >90)

## 2011-09-01 LAB — FERRITIN: Ferritin: 72 ng/mL (ref 10–291)

## 2011-09-01 LAB — IRON AND TIBC: TIBC: 283 ug/dL (ref 250–470)

## 2011-09-01 MED ORDER — DOCUSATE SODIUM 100 MG PO CAPS
100.0000 mg | ORAL_CAPSULE | Freq: Two times a day (BID) | ORAL | Status: DC
  Administered 2011-09-01 – 2011-09-02 (×2): 100 mg via ORAL
  Filled 2011-09-01 (×2): qty 1

## 2011-09-01 NOTE — Progress Notes (Signed)
Physical Therapy Note   09/01/11 1700  PT Visit Information  Last PT Received On 09/01/11  Precautions  Precautions Fall  Restrictions  LLE Weight Bearing WBAT  Bed Mobility  Supine to Sit 1: +2 Total assist;With rails (pt=40%)  Supine to Sit Details (indicate cue type and reason) very limited by pain and anxiety; attempted getting up on Right side of bed to mitigate pain; cues for technique  Transfers  Sit to Stand 1: +2 Total assist;From bed (pt=40%)  Sit to Stand Details (indicate cue type and reason) max cues and physical assist ot initiate with anterior lean; pt seemed very anxious with forward lean  Stand to Sit 1: +2 Total assist;Patient percentage (comment);To chair/3-in-1 (pt=25%)  Stand to Sit Details continued difficulty with stand to sit; pt unable to back LEs/step back to contact chair with LEs; +2 assist to control descent into recliner  Stand Pivot Transfers 1: +2 Total assist;Patient percentage (comment) (pt=20%)  Stand Pivot Transfer Details (indicate cue type and reason) extreme difficulty with taking steps/LE advancement; as with last session, pt essentially stopped moving LEs and went NWB on R and L LEs  PT - End of Session  Equipment Utilized During Treatment Gait belt  Activity Tolerance Patient limited by pain  Patient left in chair;with call bell in reach  Nurse Communication Mobility status for transfers;Mobility status for ambulation;Need for lift equipment;Weight bearing status  General  Behavior During Session [facility] for tasks performed  Cognition [employer] for tasks performed  PT - Assessment/Plan  Comments on Treatment Session Pt was motivated to get OOB but very anxious with moving, seems to be at same functional level as last session  PT Plan Discharge plan remains appropriate;Frequency needs to be updated  PT Frequency Min 5X/week  Follow Up Recommendations Skilled nursing facility  Equipment Recommended Defer to next venue  Acute Rehab PT Goals  Time For  Goal Achievement 7 days  Pt will go Supine/Side to Sit with min assist  PT Goal: Supine/Side to Sit - Progress Not progressing  Pt will go Sit to Stand with min assist;with upper extremity assist  PT Goal: Sit to Stand - Progress Not progressing  Pt will go Stand to Sit with min assist;with upper extremity assist  PT Goal: Stand to Sit - Progress Not progressing  Pt will Transfer Bed to Chair/Chair to Bed with min assist  PT Transfer Goal: Bed to Chair/Chair to Bed - Progress Not progressing    [place], [place] [phone]

## 2011-09-01 NOTE — Progress Notes (Signed)
Subjective:  No complaints. Pain controlled per patient.  Objective: Vital signs in last 24 hours: Filed Vitals:   08/31/11 1339 08/31/11 2222 09/01/11 0622 09/01/11 1300  BP: 119/67 145/80 109/66 128/72  Pulse: 79 109 81 93  Temp: 97.9 F (36.6 C) 98.4 F (36.9 C) 98.1 F (36.7 C) 99.1 F (37.3 C)  TempSrc: Oral     Resp: 18 18 18 18   Height:      Weight:      SpO2: 100% 94% 91% 93%   No intake or output data in the 24 hours ending 09/01/11 1746  Weight change:   General: Alert, awake, oriented x3, in no acute distress. Heart: Regular rate and rhythm, without murmurs, rubs, gallops. Lungs: Clear to auscultation bilaterally IN ANTERIOR LUNG FIELDS. Abdomen: Soft, nontender, nondistended, positive bowel sounds. Extremities: No clubbing cyanosis or edema with positive pedal pulses.    Lab Results:  Basename 09/01/11 0533 08/30/11 0626  NA 140 138  K 4.3 4.6  CL 110 105  CO2 25 25  GLUCOSE 86 80  BUN 18 22  CREATININE 1.08 1.13*  CALCIUM 8.5 8.4  MG -- --  PHOS -- --   No results found for this basename: AST:2,ALT:2,ALKPHOS:2,BILITOT:2,PROT:2,ALBUMIN:2 in the last 72 hours No results found for this basename: LIPASE:2,AMYLASE:2 in the last 72 hours  Basename 09/01/11 0533 08/30/11 0626  WBC 8.0 10.5  NEUTROABS -- --  HGB 9.6* 10.7*  HCT 30.2* 33.9*  MCV 91.5 92.9  PLT 147* 144*   No results found for this basename: CKTOTAL:3,CKMB:3,CKMBINDEX:3,TROPONINI:3 in the last 72 hours No components found with this basename: POCBNP:3 No results found for this basename: DDIMER:2 in the last 72 hours No results found for this basename: HGBA1C:2 in the last 72 hours No results found for this basename: CHOL:2,HDL:2,LDLCALC:2,TRIG:2,CHOLHDL:2,LDLDIRECT:2 in the last 72 hours No results found for this basename: TSH,T4TOTAL,FREET3,T3FREE,THYROIDAB in the last 72 hours  Basename 09/01/11 0947  VITAMINB12 228  FOLATE 18.5  FERRITIN 72  TIBC 283  IRON 64  RETICCTPCT --      Micro Results: No results found for this or any previous visit (from the past 240 hour(s)).  Studies/Results: No results found.  Medications:    . aspirin EC  325 mg Oral Daily  . atorvastatin  80 mg Oral q1800  . docusate sodium  100 mg Oral BID  . ezetimibe  10 mg Oral Daily  . imipramine  50 mg Oral QHS  . lipase/protease/amylase  2 capsule Oral TID AC  . loratadine  10 mg Oral Daily  . omega-3 acid ethyl esters  1 g Oral Daily  . pregabalin  75 mg Oral BID    Assessment: Principal Problem:  *Hip fracture Active Problems:  Diarrhea  Fall  Debility  Hypocalcemia  Leukocytosis   Plan: #1 Probable non displaced greater trochanter fracture/ hIP FRACTURE WBAT. PT/OT. Will need SNF. Pain management. Supportive care.  #2. FALL Mechanical fall.  #3. Dehydration  NSL IVF.  #4.Leukocytosis Likely reactive. CXR pending. UA negative. WBC trending down daily. No need for antibiotics at this time. Follow  #5 Chronic Diarrhea Being f/u as outpatient.  #6. Hypocalcemia Resolved.  #7 Normocytic anemia No overt GI bleed. Follow H/H.  #8. Prophylaxis Lovenox for DVT prophylaxis.    LOS: 4 days   [facility] 09/01/2011, 5:46 PM

## 2011-09-02 LAB — BASIC METABOLIC PANEL
BUN: 19 mg/dL (ref 6–23)
CO2: 27 mEq/L (ref 19–32)
Chloride: 108 mEq/L (ref 96–112)
GFR calc Af Amer: 54 mL/min — ABNORMAL LOW (ref >90)
Potassium: 4.2 mEq/L (ref 3.5–5.1)

## 2011-09-02 LAB — CBC
HCT: 31.9 % — ABNORMAL LOW (ref 36.0–46.0)
Hemoglobin: 10.3 g/dL — ABNORMAL LOW (ref 12.0–15.0)
RBC: 3.52 MIL/uL — ABNORMAL LOW (ref 3.87–5.11)
RDW: 15 % (ref 11.5–15.5)
WBC: 9.9 10*3/uL (ref 4.0–10.5)

## 2011-09-02 MED ORDER — OMEGA-3-ACID ETHYL ESTERS 1 G PO CAPS: 1.0000 g | ORAL_CAPSULE | Freq: Every day | ORAL | Status: DC

## 2011-09-02 MED ORDER — DSS 100 MG PO CAPS: 100.0000 mg | ORAL_CAPSULE | Freq: Two times a day (BID) | ORAL | Status: AC

## 2011-09-02 MED ORDER — ALBUTEROL SULFATE (5 MG/ML) 0.5% IN NEBU: 2.5000 mg | INHALATION_SOLUTION | RESPIRATORY_TRACT | Status: DC | PRN

## 2011-09-02 MED ORDER — IPRATROPIUM BROMIDE 0.02 % IN SOLN: 0.5000 mg | RESPIRATORY_TRACT | Status: DC | PRN

## 2011-09-02 MED ORDER — HYDROCODONE-ACETAMINOPHEN 5-325 MG PO TABS: 1.0000 | ORAL_TABLET | ORAL | Status: AC | PRN

## 2011-09-02 NOTE — Progress Notes (Signed)
Clinical Social Worker met with pt and pt son at bedside this morning to provide pt bed offers. Pt chooses bed at [facility]. Clinical Social Worker notified facility and confirmed bed availability for today. Clinical Social Worker notified MD and to facilitate pt discharge needs.   Clinical Social Worker facilitated pt discharge needs including contacting facility, family, and arranging ambulance transportation to [employer] [facility] [employer]. No further social work needs at this time. Clinical Social Worker signing off.   Jacklynn Lewis, MSW, LCSWA  Clinical Social Work [phone]

## 2011-09-02 NOTE — Discharge Summary (Addendum)
Discharge Summary  LOA IDLER MR#: [id number]  DOB:09-06-1933  Date of Admission: 08/28/2011 Date of Discharge: 09/02/2011  Patient's PCP: [name] Raider, MD, MD  Attending Physician:Savahanna Almendariz  Consults:   #1 orthopedics: Dr. Turner Daniels  Discharge Diagnoses: Hip fracture Present on Admission:  .Fall .Hip fracture .Debility .Hypocalcemia .Leukocytosis .Diarrhea   Brief Admitting History and Physical [name] is a 78 y.o. female  has a past medical history of Abdominal aneurysm without mention of rupture; Cholelithiasis; Hyperlipidemia; TIA (transient ischemic attack) (~ 09/2010); Neuropathic pain; Osteoporosis; Arthritis; Hypertension (04/30/11); Pneumonia; Migraines; Headache; and Peripheral neuropathy.  Presented with Fall out of bed when she was trying to transfer to whealchair. Fell down on the floor. Thinks she may have bumped her head. She was not able to stand up due to pain. EMS brought her to ER where she was found to have nondisplaced hip fracture. Her hardware Int that hip is intact. She had her left hip replaced in July 2009 by Dr. Luiz Blare.  No LOS no chest pain no SOB. For the rest of admission history and physical please see H&P dictated by Dr.Doutova   Discharge Medications Medication List  As of 09/02/2011 12:03 PM   START taking these medications         albuterol (5 MG/ML) 0.5% nebulizer solution   Commonly known as: PROVENTIL   Take 0.5 mLs (2.5 mg total) by nebulization every 2 (two) hours as needed for wheezing.      DSS 100 MG Caps   Take 100 mg by mouth 2 (two) times daily. Hold if develops diarrhea      HYDROcodone-acetaminophen 5-325 MG per tablet   Commonly known as: NORCO   Take 1 tablet by mouth every 4 (four) hours as needed.      ipratropium 0.02 % nebulizer solution   Commonly known as: ATROVENT   Take 2.5 mLs (0.5 mg total) by nebulization every 2 (two) hours as needed.      omega-3 acid ethyl esters 1 G capsule   Commonly known  as: LOVAZA   Take 1 capsule (1 g total) by mouth daily.         CONTINUE taking these medications         aspirin EC 325 MG tablet      CREON 24000 UNITS Cpep   Generic drug: Pancrelipase (Lip-Prot-Amyl)      ezetimibe 10 MG tablet   Commonly known as: ZETIA      fish oil-omega-3 fatty acids 1000 MG capsule      imipramine 50 MG tablet   Commonly known as: TOFRANIL      pregabalin 75 MG capsule   Commonly known as: LYRICA      rosuvastatin 40 MG tablet   Commonly known as: CRESTOR      ZYRTEC ALLERGY 10 MG tablet   Generic drug: cetirizine          Where to get your medications    These are the prescriptions that you need to pick up.   You may get these medications from any pharmacy.         albuterol (5 MG/ML) 0.5% nebulizer solution   DSS 100 MG Caps   HYDROcodone-acetaminophen 5-325 MG per tablet   ipratropium 0.02 % nebulizer solution   omega-3 acid ethyl esters 1 G capsule           Hospital Course: Hip fracture [name] was admitted with a possible nondisplaced greater trochanteric fracture. Patient was placed on a  MedSurg bed he was placed on supportive care and pain management. An orthopedic consultation was obtained and patient was seen in consultation by Dr. Norm Salt on 08/29/2011. It was recommended that patient be weight bearing as tolerated using the walker with direct supervision as was told was a high fall risk. It was felt that no surgical intervention was needed at this time and conservative therapy was the treatment of choice. It was felt that patient will follow back up with Dr. Turner Daniels  as outpatient in 2-3 weeks for routine x-rays and further evaluation. Patient's pain was controlled adequately during the hospitalization and patient is currently awaiting placement at the skilled nursing facility. The rest of patient's chronic medical issues remained stable throughout the hospitalization.  Present on Admission:  .Fall .Hip  fracture .Debility .Hypocalcemia .Leukocytosis .Diarrhea   Day of Discharge BP 125/62  Pulse 88  Temp(Src) 98.1 F (36.7 C) (Oral)  Resp 18  Ht 5\' 4"  (1.626 m)  Wt 74.844 kg (165 lb)  BMI 28.32 kg/m2  SpO2 94% Subjective: Patient without any complaints. Patient states that her pain is controlled. General: Alert, awake, oriented x3, in no acute distress. Heart: Regular rate and rhythm, without murmurs, rubs, gallops. Lungs: Min wheezes o/w clear. Abdomen: Soft, nontender, nondistended, positive bowel sounds. Extremities: No clubbing cyanosis or edema with positive pedal pulses.   Results for orders placed during the hospital encounter of 08/28/11 (from the past 48 hour(s))  CBC     Status: Abnormal   Collection Time   09/01/11  5:33 AM      Component Value Range Comment   WBC 8.0  4.0 - 10.5 (K/uL)    RBC 3.30 (*) 3.87 - 5.11 (MIL/uL)    Hemoglobin 9.6 (*) 12.0 - 15.0 (g/dL)    HCT 40.9 (*) 81.1 - 46.0 (%)    MCV 91.5  78.0 - 100.0 (fL)    MCH 29.1  26.0 - 34.0 (pg)    MCHC 31.8  30.0 - 36.0 (g/dL)    RDW 91.4  78.2 - 95.6 (%)    Platelets 147 (*) 150 - 400 (K/uL)   BASIC METABOLIC PANEL     Status: Abnormal   Collection Time   09/01/11  5:33 AM      Component Value Range Comment   Sodium 140  135 - 145 (mEq/L)    Potassium 4.3  3.5 - 5.1 (mEq/L)    Chloride 110  96 - 112 (mEq/L)    CO2 25  19 - 32 (mEq/L)    Glucose, Bld 86  70 - 99 (mg/dL)    BUN 18  6 - 23 (mg/dL)    Creatinine, Ser 2.13  0.50 - 1.10 (mg/dL)    Calcium 8.5  8.4 - 10.5 (mg/dL)    GFR calc non Af Amer 49 (*) >90 (mL/min)    GFR calc Af Amer 57 (*) >90 (mL/min)   VITAMIN B12     Status: Normal   Collection Time   09/01/11  9:47 AM      Component Value Range Comment   Vitamin B-12 228  211 - 911 (pg/mL)   FOLATE     Status: Normal   Collection Time   09/01/11  9:47 AM      Component Value Range Comment   Folate 18.5     IRON AND TIBC     Status: Normal   Collection Time   09/01/11  9:47 AM       Component Value Range  Comment   Iron 64  42 - 135 (ug/dL)    TIBC 696  295 - 284 (ug/dL)    Saturation Ratios 23  20 - 55 (%)    UIBC 219  125 - 400 (ug/dL)   FERRITIN     Status: Normal   Collection Time   09/01/11  9:47 AM      Component Value Range Comment   Ferritin 72  10 - 291 (ng/mL)   CBC     Status: Abnormal   Collection Time   09/02/11  5:25 AM      Component Value Range Comment   WBC 9.9  4.0 - 10.5 (K/uL)    RBC 3.52 (*) 3.87 - 5.11 (MIL/uL)    Hemoglobin 10.3 (*) 12.0 - 15.0 (g/dL)    HCT 13.2 (*) 44.0 - 46.0 (%)    MCV 90.6  78.0 - 100.0 (fL)    MCH 29.3  26.0 - 34.0 (pg)    MCHC 32.3  30.0 - 36.0 (g/dL)    RDW 10.2  72.5 - 36.6 (%)    Platelets 162  150 - 400 (K/uL)   BASIC METABOLIC PANEL     Status: Abnormal   Collection Time   09/02/11  5:25 AM      Component Value Range Comment   Sodium 141  135 - 145 (mEq/L)    Potassium 4.2  3.5 - 5.1 (mEq/L)    Chloride 108  96 - 112 (mEq/L)    CO2 27  19 - 32 (mEq/L)    Glucose, Bld 87  70 - 99 (mg/dL)    BUN 19  6 - 23 (mg/dL)    Creatinine, Ser 4.40 (*) 0.50 - 1.10 (mg/dL)    Calcium 8.8  8.4 - 10.5 (mg/dL)    GFR calc non Af Amer 47 (*) >90 (mL/min)    GFR calc Af Amer 54 (*) >90 (mL/min)     X-ray Chest Pa And Lateral   08/29/2011  *RADIOLOGY REPORT*  Clinical Data: Cough, shortness of breath, and chest pain.  CHEST - 2 VIEW  Comparison: 09/13 and 01/15/2011  Findings: Heart size and vascularity are normal and the lungs are clear.  No osseous abnormality.  IMPRESSION: Normal chest.  Original Report Authenticated By: Gwynn Burly, M.D.   Dg Hip Complete Left  08/28/2011  *RADIOLOGY REPORT*  Clinical Data: Left hip pain  LEFT HIP - COMPLETE 2+ VIEW  Comparison: 01/15/2011  Findings: Left total hip arthroplasty with anatomic alignment.  No breakage or loosening of the hardware.  Side plate in the left femur with cerclage wires are in place.  Osteopenia.  No acute fracture and no dislocation.  IMPRESSION: No acute  bony pathology.  Original Report Authenticated By: Donavan Burnet, M.D.   Ct Head Wo Contrast  08/29/2011  *RADIOLOGY REPORT*  Clinical Data: Status post fall  CT CERVICAL SPINE WITHOUT CONTRAST,CT HEAD WITHOUT CONTRAST  Technique:  Multidetector CT imaging of the cervical spine was performed. Multiplanar CT image reconstructions were also generated.,Technique:  Contiguous axial images were obtained from the base of the skull through the vertex without contrast.  Comparison: 01/15/2011 MRI  Findings:  Head:  Left frontal lobe hypoattenuation is similar to prior. Mild hypoattenuation versus volume loss involving the inferior left temporal lobe anteriorly is also unchanged.  Prominence of the sulci, cisterns, and ventricles, in keeping with volume loss. There are subcortical and periventricular white matter hypodensities, a nonspecific finding most often seen with chronic microangiopathic  changes.  There is no evidence for acute hemorrhage, overt hydrocephalus, mass lesion, or abnormal extra-axial fluid collection.  No definite CT evidence for acute cortical based (large artery) infarction. The visualized paranasal sinuses and mastoid air cells are predominately clear. Previous left temporoparietal craniotomy. No displaced calvarial fracture identified.  Cervical spine: Apical scarring.  Maintained craniocervical relationship.  No acute fracture or dislocation identified.  Facet arthropathy at C2-3.  Atherosclerotic vascular calcification.  IMPRESSION: Left frontal lobe encephalomalacia.  No definite acute intracranial abnormality.  Mild cervical spine degenerative changes.  No acute fracture or dislocation identified.  Original Report Authenticated By: Waneta Martins, M.D.   Ct Cervical Spine Wo Contrast  08/29/2011  *RADIOLOGY REPORT*  Clinical Data: Status post fall  CT CERVICAL SPINE WITHOUT CONTRAST,CT HEAD WITHOUT CONTRAST  Technique:  Multidetector CT imaging of the cervical spine was performed.  Multiplanar CT image reconstructions were also generated.,Technique:  Contiguous axial images were obtained from the base of the skull through the vertex without contrast.  Comparison: 01/15/2011 MRI  Findings:  Head:  Left frontal lobe hypoattenuation is similar to prior. Mild hypoattenuation versus volume loss involving the inferior left temporal lobe anteriorly is also unchanged.  Prominence of the sulci, cisterns, and ventricles, in keeping with volume loss. There are subcortical and periventricular white matter hypodensities, a nonspecific finding most often seen with chronic microangiopathic changes.  There is no evidence for acute hemorrhage, overt hydrocephalus, mass lesion, or abnormal extra-axial fluid collection.  No definite CT evidence for acute cortical based (large artery) infarction. The visualized paranasal sinuses and mastoid air cells are predominately clear. Previous left temporoparietal craniotomy. No displaced calvarial fracture identified.  Cervical spine: Apical scarring.  Maintained craniocervical relationship.  No acute fracture or dislocation identified.  Facet arthropathy at C2-3.  Atherosclerotic vascular calcification.  IMPRESSION: Left frontal lobe encephalomalacia.  No definite acute intracranial abnormality.  Mild cervical spine degenerative changes.  No acute fracture or dislocation identified.  Original Report Authenticated By: Waneta Martins, M.D.   Ct Hip Left Wo Contrast  08/28/2011  *RADIOLOGY REPORT*  Clinical Data: Left hip pain  CT OF THE LEFT HIP WITHOUT CONTRAST  Technique:  Multidetector CT imaging was performed according to the standard protocol. Multiplanar CT image reconstructions were also generated.  Comparison: None.  Findings: A left total hip arthroplasty is in place.  There is a fracture involving the proximal left femur.  The fracture line primarily involves the greater trochanter.  The fracture line extends inferiorly at the lateral proximal femur.  The  fracture line does not extend across the shaft of the femur.  The hardware is intact.  Fracture fragments are minimally displaced.  Infrarenal abdominal aortic aneurysm is 4.9 cm in maximal transverse diameter.  It is partially imaged.  IMPRESSION: Acute fracture essentially involving the greater trochanter of the femur.  The fracture does not extend across the shaft of the proximal left femur.  The hardware is intact.  4.9 cm abdominal aortic aneurysm is partially imaged.  Original Report Authenticated By: Donavan Burnet, M.D.     Disposition: SNF  Diet: Regular/mechanical soft  Activity: Increase activity slowly/per skilled nursing facility   Follow-up Appts: Discharge Orders    Future Appointments: Provider: Department: Dept Phone: Center:   01/14/2012 9:00 AM [facility] [phone] [facility]   01/14/2012 9:30 AM Chuck Hint, MD [facility] [phone] [facility]     Future Orders Please Complete By Expires   Diet general  Comments:   Mechanical soft   Increase activity slowly      Discharge instructions      Comments:   Follow up with Dr Turner Daniels in 2 weeks.      TESTS THAT NEED FOLLOW-UP   Time spent on discharge, talking to the patient, and coordinating care: 50 mins.   SignedRamiro Harvest 09/02/2011, 12:03 PM

## 2011-09-02 NOTE — Progress Notes (Signed)
Physical Therapy Note   09/02/11 0700  PT - Assessment/Plan  PT Plan Discharge plan remains appropriate;Frequency needs to be updated  PT Frequency Min 3X/week (Will decr freq based on dc plan for SNF, and slow progress, decr participation)    Van Clines,  [phone]

## 2011-11-16 ENCOUNTER — Observation Stay (HOSPITAL_COMMUNITY)
Admission: EM | Admit: 2011-11-16 | Discharge: 2011-11-18 | Disposition: A | Discharge status: Skilled Nursing Facility | Payer: Medicare Other | Attending: Orthopedic Surgery | Admitting: Orthopedic Surgery

## 2011-11-16 ENCOUNTER — Emergency Department (HOSPITAL_COMMUNITY): Payer: Medicare Other

## 2011-11-16 ENCOUNTER — Encounter (HOSPITAL_COMMUNITY): Payer: Self-pay | Admitting: *Deleted

## 2011-11-16 DIAGNOSIS — G609 Hereditary and idiopathic neuropathy, unspecified: Secondary | ICD-10-CM | POA: Insufficient documentation

## 2011-11-16 DIAGNOSIS — M810 Age-related osteoporosis without current pathological fracture: Secondary | ICD-10-CM | POA: Insufficient documentation

## 2011-11-16 DIAGNOSIS — S82853A Displaced trimalleolar fracture of unspecified lower leg, initial encounter for closed fracture: Principal | ICD-10-CM | POA: Insufficient documentation

## 2011-11-16 DIAGNOSIS — I6529 Occlusion and stenosis of unspecified carotid artery: Secondary | ICD-10-CM | POA: Insufficient documentation

## 2011-11-16 DIAGNOSIS — Y92009 Unspecified place in unspecified non-institutional (private) residence as the place of occurrence of the external cause: Secondary | ICD-10-CM | POA: Insufficient documentation

## 2011-11-16 DIAGNOSIS — F17200 Nicotine dependence, unspecified, uncomplicated: Secondary | ICD-10-CM | POA: Insufficient documentation

## 2011-11-16 DIAGNOSIS — Z8673 Personal history of transient ischemic attack (TIA), and cerebral infarction without residual deficits: Secondary | ICD-10-CM | POA: Insufficient documentation

## 2011-11-16 DIAGNOSIS — W19XXXA Unspecified fall, initial encounter: Secondary | ICD-10-CM | POA: Insufficient documentation

## 2011-11-16 HISTORY — DX: Cerebral infarction, unspecified: I63.9

## 2011-11-16 HISTORY — DX: Chronic kidney disease, unspecified: N18.9

## 2011-11-16 LAB — CBC
HCT: 47.1 % — ABNORMAL HIGH (ref 36.0–46.0)
Hemoglobin: 15.1 g/dL — ABNORMAL HIGH (ref 12.0–15.0)
MCH: 29.3 pg (ref 26.0–34.0)
MCHC: 32.1 g/dL (ref 30.0–36.0)
MCV: 91.3 fL (ref 78.0–100.0)
Platelets: 232 10*3/uL (ref 150–400)
RBC: 5.16 MIL/uL — ABNORMAL HIGH (ref 3.87–5.11)
RDW: 14 % (ref 11.5–15.5)
WBC: 8.6 10*3/uL (ref 4.0–10.5)

## 2011-11-16 LAB — BASIC METABOLIC PANEL
BUN: 27 mg/dL — ABNORMAL HIGH (ref 6–23)
CO2: 28 mEq/L (ref 19–32)
Calcium: 9.8 mg/dL (ref 8.4–10.5)
Chloride: 100 mEq/L (ref 96–112)
Creatinine, Ser: 1.19 mg/dL — ABNORMAL HIGH (ref 0.50–1.10)
GFR calc Af Amer: 50 mL/min — ABNORMAL LOW (ref >90)
GFR calc non Af Amer: 44 mL/min — ABNORMAL LOW (ref >90)
Glucose, Bld: 79 mg/dL (ref 70–99)
Potassium: 5.2 mEq/L — ABNORMAL HIGH (ref 3.5–5.1)
Sodium: 138 mEq/L (ref 135–145)

## 2011-11-16 MED ORDER — EZETIMIBE 10 MG PO TABS
10.0000 mg | ORAL_TABLET | Freq: Every day | ORAL | Status: DC
  Administered 2011-11-16 – 2011-11-18 (×3): 10 mg via ORAL
  Filled 2011-11-16 (×4): qty 1

## 2011-11-16 MED ORDER — DOCUSATE SODIUM 100 MG PO CAPS
100.0000 mg | ORAL_CAPSULE | Freq: Two times a day (BID) | ORAL | Status: DC
  Administered 2011-11-16 – 2011-11-18 (×4): 100 mg via ORAL
  Filled 2011-11-16 (×5): qty 1

## 2011-11-16 MED ORDER — KCL IN DEXTROSE-NACL 20-5-0.45 MEQ/L-%-% IV SOLN
INTRAVENOUS | Status: DC
  Administered 2011-11-16: 23:00:00 via INTRAVENOUS
  Filled 2011-11-16: qty 1000

## 2011-11-16 MED ORDER — DIPHENHYDRAMINE HCL 12.5 MG/5ML PO ELIX: 12.5000 mg | ORAL_SOLUTION | Freq: Four times a day (QID) | ORAL | Status: DC | PRN

## 2011-11-16 MED ORDER — ATORVASTATIN CALCIUM 80 MG PO TABS
80.0000 mg | ORAL_TABLET | Freq: Every day | ORAL | Status: DC
  Administered 2011-11-17: 80 mg via ORAL
  Filled 2011-11-16 (×2): qty 1

## 2011-11-16 MED ORDER — ASPIRIN EC 325 MG PO TBEC
325.0000 mg | DELAYED_RELEASE_TABLET | Freq: Every day | ORAL | Status: DC
  Administered 2011-11-17 – 2011-11-18 (×2): 325 mg via ORAL
  Filled 2011-11-16 (×2): qty 1

## 2011-11-16 MED ORDER — PANCRELIPASE (LIP-PROT-AMYL) 12000-38000 UNITS PO CPEP
2.0000 | ORAL_CAPSULE | Freq: Three times a day (TID) | ORAL | Status: DC
  Administered 2011-11-17 – 2011-11-18 (×5): 2 via ORAL
  Filled 2011-11-16 (×8): qty 2

## 2011-11-16 MED ORDER — OMEGA-3-ACID ETHYL ESTERS 1 G PO CAPS
2.0000 g | ORAL_CAPSULE | Freq: Every day | ORAL | Status: DC
  Administered 2011-11-17 – 2011-11-18 (×2): 2 g via ORAL
  Filled 2011-11-16 (×2): qty 2

## 2011-11-16 MED ORDER — IMIPRAMINE HCL 50 MG PO TABS
50.0000 mg | ORAL_TABLET | Freq: Every day | ORAL | Status: DC
  Administered 2011-11-16 – 2011-11-17 (×2): 50 mg via ORAL
  Filled 2011-11-16 (×4): qty 1

## 2011-11-16 MED ORDER — ACETAMINOPHEN 325 MG PO TABS
650.0000 mg | ORAL_TABLET | Freq: Four times a day (QID) | ORAL | Status: DC | PRN
  Administered 2011-11-16: 650 mg via ORAL
  Filled 2011-11-16: qty 2

## 2011-11-16 MED ORDER — ONDANSETRON HCL 4 MG/2ML IJ SOLN: 4.0000 mg | Freq: Four times a day (QID) | INTRAMUSCULAR | Status: DC | PRN

## 2011-11-16 MED ORDER — PREGABALIN 75 MG PO CAPS
75.0000 mg | ORAL_CAPSULE | Freq: Two times a day (BID) | ORAL | Status: DC
  Administered 2011-11-16 – 2011-11-17 (×3): 75 mg via ORAL
  Filled 2011-11-16 (×3): qty 1

## 2011-11-16 MED ORDER — MORPHINE SULFATE 4 MG/ML IJ SOLN
4.0000 mg | Freq: Once | INTRAMUSCULAR | Status: AC
  Administered 2011-11-16: 4 mg via INTRAVENOUS
  Filled 2011-11-16: qty 1

## 2011-11-16 MED ORDER — LORATADINE 10 MG PO TABS
10.0000 mg | ORAL_TABLET | Freq: Every day | ORAL | Status: DC
  Administered 2011-11-16 – 2011-11-18 (×3): 10 mg via ORAL
  Filled 2011-11-16 (×4): qty 1

## 2011-11-16 MED ORDER — ONDANSETRON HCL 4 MG/2ML IJ SOLN: 4.0000 mg | Freq: Once | INTRAMUSCULAR | Status: DC

## 2011-11-16 MED ORDER — DIPHENHYDRAMINE HCL 50 MG/ML IJ SOLN
12.5000 mg | Freq: Four times a day (QID) | INTRAMUSCULAR | Status: DC | PRN
  Administered 2011-11-17: 12.5 mg via INTRAVENOUS
  Filled 2011-11-16: qty 1

## 2011-11-16 MED ORDER — HYDROCODONE-ACETAMINOPHEN 5-325 MG PO TABS
1.0000 | ORAL_TABLET | ORAL | Status: DC | PRN
  Administered 2011-11-16 – 2011-11-18 (×7): 1 via ORAL
  Filled 2011-11-16 (×8): qty 1

## 2011-11-16 MED ORDER — SENNA 8.6 MG PO TABS
1.0000 | ORAL_TABLET | Freq: Two times a day (BID) | ORAL | Status: DC
  Administered 2011-11-16 – 2011-11-18 (×4): 8.6 mg via ORAL
  Filled 2011-11-16 (×5): qty 1

## 2011-11-16 MED ORDER — OMEGA-3 FATTY ACIDS 1000 MG PO CAPS: 2.0000 g | ORAL_CAPSULE | ORAL | Status: DC

## 2011-11-16 NOTE — ED Notes (Signed)
Pt from home, brought in by EMS after falling while going to the bathroom around noon today. Pt reports using a walker when going to the bathroom which is when fall happened; otherwise, pt is wheelchair bound. Pt reports that left leg "just gives out" sometimes, endorses hx of hip fracture x 3 with last surgery in 2009. Pt denies LOC or hitting head but endorses right ankle swelling and pain with bearing weight.

## 2011-11-16 NOTE — ED Notes (Signed)
RN to obtain labs with start of IV 

## 2011-11-16 NOTE — Progress Notes (Signed)
Cm spoke with pt with adult son at bedside concerning CM consult for [facility]. Pt states unable to go home. Currently active with [facility] in which she receives Rn/PT/OT. Pt states wheelchair bound. Pt and son state without 24 hour assistance pt unable to perform ADLs. Pt states unable to afford private duty care. MD notified of pt's ineligibility for SNF unless admitted to hospital or having need for skilled nursing. Per MD, pt to be admitted.   Leonie Green [phone]

## 2011-11-16 NOTE — ED Notes (Signed)
Per EMS, pt from home after falling while going to the bathroom around noon today. Per EMS, pt reports losing balance and denies LOC or hitting head, also denies taking Coumadin. Per EMS, pt with hx of left hip fracture x2 with last surgery 6 weeks ago and has been minimally ambulatory using a wheelchair since.

## 2011-11-16 NOTE — H&P (Signed)
[name] is an 78 y.o. female.   Chief Complaint:   Left ankle pain HPI: 78 y/o female with PMH of carotid stenosis and "mini-stroke" fell this morning at home while trying to get to the bathroom.  She says the foot was caught between a cabinet and the wall and twisted while falling.  No h/o previous ankle fracture in the past.  She has a h/o hip fracture fixed by Dr. Luiz Blare 4 years ago.  She denies any h/o diabetes or hypothyroidism.  She smokes 2 cigarettes per day.  She spends her day sitting in a wheelchair and does not walk at home except for when she goes to the bathroom.  Her ankle hurts a little at rest and moderately with sharp pain with any motion.  She denies any dizziness or LOC at the time of her fall.  Denies CP, SOB.  Past Medical History  Diagnosis Date  . Abdominal aneurysm without mention of rupture   . Cholelithiasis   . Hyperlipidemia   . TIA (transient ischemic attack) ~ 09/2010    "[name]"  . Neuropathic pain   . Osteoporosis   . Arthritis   . Hypertension 04/30/11    "not a problem now"  . Pneumonia     "once or twice"  . Migraines   . Headache   . Peripheral neuropathy     "hands, feet, legs"    Past Surgical History  Procedure Date  . Carotid endarterectomy 03/1999; 04/1999    bilateral (Dr. Edilia Bo)  . 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 Tonsillectomy     "when I was little"  . Abdominal hysterectomy 1984  . Tubal ligation 1975  . Dilation and curettage of uterus     "I've had a bunch of those"  . Colon surgery 2002    "8 inches of my colon taken out"  . Fracture surgery 02/2008    "femur; put rod & screws in"  . Fracture surgery 03/2008    "replaced screws in femur"  . Fracture surgery 04/2008    "replaced hardware S/P rebreak femur"    Family History  Problem Relation Age of Onset  . Diabetes Mother   . Heart disease Mother   . Hypertension Mother   . Hyperlipidemia Mother   . Aneurysm Father     . Diabetes Brother    Social History:  reports that she has been smoking Cigarettes.  She has a 15 pack-year smoking history. She has never used smokeless tobacco. She reports that she does not drink alcohol or use illicit drugs.  Allergies: pcn, codeine Allergies  Allergen Reactions  . Codeine Rash  . Penicillins Rash     Results for orders placed during the hospital encounter of 11/16/11 (from the past 48 hour(s))  CBC     Status: Abnormal   Collection Time   11/16/11  3:15 PM      Component Value Range Comment   WBC 8.6  4.0 - 10.5 (K/uL)    RBC 5.16 (*) 3.87 - 5.11 (MIL/uL)    Hemoglobin 15.1 (*) 12.0 - 15.0 (g/dL)    HCT 57.8 (*) 46.9 - 46.0 (%)    MCV 91.3  78.0 - 100.0 (fL)    MCH 29.3  26.0 - 34.0 (pg)    MCHC 32.1  30.0 - 36.0 (g/dL)    RDW 62.9  52.8 - 41.3 (%)    Platelets 232  150 -  400 (K/uL)   BASIC METABOLIC PANEL     Status: Abnormal   Collection Time   11/16/11  3:15 PM      Component Value Range Comment   Sodium 138  135 - 145 (mEq/L)    Potassium 5.2 (*) 3.5 - 5.1 (mEq/L)    Chloride 100  96 - 112 (mEq/L)    CO2 28  19 - 32 (mEq/L)    Glucose, Bld 79  70 - 99 (mg/dL)    BUN 27 (*) 6 - 23 (mg/dL)    Creatinine, Ser 3.08 (*) 0.50 - 1.10 (mg/dL)    Calcium 9.8  8.4 - 10.5 (mg/dL)    GFR calc non Af Amer 44 (*) >90 (mL/min)    GFR calc Af Amer 50 (*) >90 (mL/min)    Dg Chest 2 View  11/16/2011  *RADIOLOGY REPORT*  Clinical Data: Preop radiograph  CHEST - 2 VIEW  Comparison: 08/29/2011  Findings: Heart size is normal.  No pleural effusion or edema.  No airspace consolidation identified.  The lung volumes are low. Scar like densities noted the left base.  Review of the review of the visualized osseous structures is unremarkable.  IMPRESSION:  1.  No acute cardiopulmonary abnormalities. 2.  Low lung volumes.  Original Report Authenticated By: Rosealee Albee, M.D.   Dg Ankle Complete Right  11/16/2011  *RADIOLOGY REPORT*  Clinical Data: 78 year old female with  pain following ankle injury 3 weeks ago.  Nonhealing anterior leg wound.  RIGHT ANKLE - COMPLETE 3+ VIEW  Comparison: None.  Findings: Comminuted mildly displaced fractures of the medial and lateral malleolus.  Overall mortise joint alignment is preserved. The talar dome is intact.  On the lateral view there may also be a small impaction type fracture of the posterior malleolus. Calcaneus appears intact.  Calcified atherosclerosis in the right lower extremity.  IMPRESSION: Trimalleolar comminuted ankle fracture, but minimally-displaced and without significant mortise joint subluxation.  Talar dome appears intact.  Original Report Authenticated By: Ulla Potash III, M.D.    ROS  No recent f/c/n/v/wt loss.  Blood pressure 144/88, pulse 68, temperature 98 F (36.7 C), temperature source Oral, resp. rate 20, SpO2 100.00%. Physical Exam elderly female in nad.  A and O x 4. Mood and affect normal.  Edentulous.  EOMI.  Respirations unlabored.  L ankle without gross deformity.  Toes with cap refill < 2 sec.  Skin intact ( by report).  No lymphadenopathy.  PF and DF of toes 5/5.  Sens to LT diminished at toes.  Assessment/Plan L ankle trimalleolar fracture - this injury is a minimally displaced trimalleolar fracture of the left ankle in a patient that is essentially non-ambulatory.  She tells me that she wants to avoid surgery if at all possible and has declined surgical intervention by Dr. Imogene Burn for recurrent carotid stenosis.  She is unable to go home since her son travels, and she'll have no help to get to the bathroom or to bed.  I believe closed treatment of this injury is warranted unless the fracture displaces in the next few weeks.  We'll splint her and admit her for eval by PT and OT.  I think she'll require placement in a SNF for at least a couple of months for rehab.  She and her son understand this plan and agree.  Toni Arthurs 11/16/2011, 6:10 PM

## 2011-11-16 NOTE — ED Notes (Signed)
Pt's son leaving but left contact info: cell [phone].

## 2011-11-16 NOTE — ED Provider Notes (Addendum)
History    Female with right ankle pain. Patient fell earlier this afternoon. She was trying to walk to the bathroom when she lost her balance. Does think she struck her head. Denies neck or back pain. Denies use of blood thinners aside from asa and fish oil. No numbness to tingling. No other acute complaints.  CSN: [id number]  Arrival date & time 11/16/11  1231   First MD Initiated Contact with Patient 11/16/11 1258      Chief Complaint  Patient presents with  . Ankle Pain    right  . Fall    (Consider location/radiation/quality/duration/timing/severity/associated sxs/prior treatment) HPI  Past Medical History  Diagnosis Date  . Abdominal aneurysm without mention of rupture   . Cholelithiasis   . Hyperlipidemia   . TIA (transient ischemic attack) ~ 09/2010    "[name]"  . Neuropathic pain   . Osteoporosis   . Arthritis   . Hypertension 04/30/11    "not a problem now"  . Pneumonia     "once or twice"  . Migraines   . Headache   . Peripheral neuropathy     "hands, feet, legs"    Past Surgical History  Procedure Date  . Carotid endarterectomy 03/1999; 04/1999    bilateral (Dr. Edilia Bo)  . 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 Tonsillectomy     "when I was little"  . Abdominal hysterectomy 1984  . Tubal ligation 1975  . Dilation and curettage of uterus     "I've had a bunch of those"  . Colon surgery 2002    "8 inches of my colon taken out"  . Fracture surgery 02/2008    "femur; put rod & screws in"  . Fracture surgery 03/2008    "replaced screws in femur"  . Fracture surgery 04/2008    "replaced hardware S/P rebreak femur"    Family History  Problem Relation Age of Onset  . Diabetes Mother   . Heart disease Mother   . Hypertension Mother   . Hyperlipidemia Mother   . Aneurysm Father   . Diabetes Brother     History  Substance Use Topics  . Smoking status: Current Everyday Smoker -- 0.2 packs/day for 60 years   Types: Cigarettes  . Smokeless tobacco: Never Used  . Alcohol Use: No    OB History    Grav Para Term Preterm Abortions TAB SAB Ect Mult Living                  Review of Systems   Review of symptoms negative unless otherwise noted in HPI.   Allergies  Codeine and Penicillins  Home Medications   Current Outpatient Rx  Name Route Sig Dispense Refill  . ACETAMINOPHEN 500 MG PO TABS Oral Take 1,000 mg by mouth every 6 (six) hours as needed. For headache/pain.    . ASPIRIN EC 325 MG PO TBEC Oral Take 325 mg by mouth every morning.    Marland Kitchen CETIRIZINE HCL 10 MG PO TABS Oral Take 10 mg by mouth every morning.     Marland Kitchen CREON 24000 UNITS PO CPEP Oral Take 2 capsules by mouth 3 (three) times daily before meals.     Marland Kitchen EZETIMIBE 10 MG PO TABS Oral Take 10 mg by mouth daily.      . OMEGA-3 FATTY ACIDS 1000 MG PO CAPS Oral Take 2 g by mouth every morning.    . IMIPRAMINE HCL 50 MG  PO TABS Oral Take 50 mg by mouth at bedtime.      Marland Kitchen PREGABALIN 75 MG PO CAPS Oral Take 75 mg by mouth 2 (two) times daily.      Marland Kitchen ROSUVASTATIN CALCIUM 40 MG PO TABS Oral Take 40 mg by mouth daily with supper.       BP 124/75  Pulse 86  Temp 98 F (36.7 C)  Resp 16  SpO2 98%  Physical Exam  Nursing note and vitals reviewed. Constitutional: She appears well-developed and well-nourished. No distress.  HENT:  Head: Normocephalic and atraumatic.  Eyes: Conjunctivae are normal. Right eye exhibits no discharge. Left eye exhibits no discharge.  Neck: Neck supple.  Cardiovascular: Normal rate, regular rhythm and normal heart sounds.  Exam reveals no gallop and no friction rub.   No murmur heard. Pulmonary/Chest: Effort normal and breath sounds normal. No respiratory distress.  Abdominal: Soft. She exhibits no distension. There is no tenderness.  Musculoskeletal: She exhibits no edema and no tenderness.       R ankle diffuse swollen and tender. Ecchymotic. Skin intact. Neurovascularly intact distally.  Neurological:  She is alert.  Skin: Skin is warm and dry.       Pre-existing wound to mid R shin.   Psychiatric: Her behavior is normal. Thought content normal.    ED Course  Procedures (including critical care time)  Labs Reviewed - No data to display Dg Ankle Complete Right  11/16/2011  *RADIOLOGY REPORT*  Clinical Data: 78 year old female with pain following ankle injury 3 weeks ago.  Nonhealing anterior leg wound.  RIGHT ANKLE - COMPLETE 3+ VIEW  Comparison: None.  Findings: Comminuted mildly displaced fractures of the medial and lateral malleolus.  Overall mortise joint alignment is preserved. The talar dome is intact.  On the lateral view there may also be a small impaction type fracture of the posterior malleolus. Calcaneus appears intact.  Calcified atherosclerosis in the right lower extremity.  IMPRESSION: Trimalleolar comminuted ankle fracture, but minimally-displaced and without significant mortise joint subluxation.  Talar dome appears intact.  Original Report Authenticated By: Harley Hallmark, M.D.   EKG:  Rhythm: Sinus with PACs Rate: 91 Axis: Left Intervals: Left bundle branch block ST segments: Nonspecific ST changes.   1. Trimalleolar fracture of ankle, closed       MDM  Female with right ankle pain after mechanical fall. X-rays are significant for a closed trimalleolar fracture. Minimal displacement. Splinted. Discussed with orthopedics, Dr. Victorino Dike. The initial plan was for discharge because patient lives at home with her son who though he can take care of her. Upon rethinking this he does not think he can now. Will consult medicine for admission.        Raeford Razor, MD 11/16/11 1502   3:55 PM Case management spoke with pt and husband. Pt already has home PT/OT and aid. Unable to pay for around the clock care out of pocket and doesn't think could manage even for a few days. Son says would be unable to take care of pt by self when help is not around. Understand that  admission to hospital may not be paid for my insurance. Apparently does not meet criteria for SNF placement directly from ED. Earlier discussion with Dr Victorino Dike who kind enough to accept pt if needed since does not meet medicine criteria for admit. Requesting transfer to [facility] because of more OR flexibility. Discussed with pt and family who are in agreement.  Raeford Razor, MD 11/16/11 1708  Transfer  process initiated. Pt to be seen in [place] ED prior to transfer though by Dr. Victorino Dike.  Raeford Razor, MD 11/16/11 1724  Raeford Razor, MD 01/02/12 [phone]

## 2011-11-16 NOTE — ED Notes (Signed)
Pt. transferred to [facility] via [facility], NAD noted

## 2011-11-16 NOTE — ED Notes (Signed)
ZOX:[id number]<AV> Expected date:11/16/11<BR> Expected time:12:19 PM<BR> Means of arrival:Ambulance<BR> Comments:<BR> M11. 72 f. Fall, poss ankle fx. 15 mins

## 2011-11-16 NOTE — ED Notes (Signed)
Dr Hewitt at bedside.  

## 2011-11-16 NOTE — ED Notes (Signed)
Pt. Alert and oriented awaiting transfer to [facility], NAD noted

## 2011-11-16 NOTE — ED Notes (Signed)
MD at bedside. 

## 2011-11-17 ENCOUNTER — Encounter (HOSPITAL_COMMUNITY): Payer: Self-pay | Admitting: General Practice

## 2011-11-17 NOTE — Progress Notes (Signed)
OT Screen OT order received. Chart reviewed. Pt rather sedentary PTA and plans to D/C to SNF for rehab. Will defer OT eval to SNF. OT signing off. Thanks for referral. Luisa Dago, OTR/L  [phone] 11/17/2011

## 2011-11-17 NOTE — Progress Notes (Signed)
Subjective: Pt c/o not being able to sleep much last night.  Some pain in right ankle.  No f/c/n/v.  Objective: Vital signs in last 24 hours: Temp:  [97.6 F (36.4 C)-99.7 F (37.6 C)] 97.6 F (36.4 C) (06/03 0626) Pulse Rate:  [68-100] 82  (06/03 0626) Resp:  [16-20] 18  (06/03 0626) BP: (96-174)/(43-96) 96/43 mmHg (06/03 0626) SpO2:  [95 %-100 %] 95 % (06/03 0626) Weight:  [73.483 kg (162 lb)] 73.483 kg (162 lb) (06/02 2136)  Intake/Output from previous day: 06/02 0701 - 06/03 0700 In: 360 [P.O.:360] Out: 600 [Urine:600] Intake/Output this shift:     Basename 11/16/11 1515  HGB 15.1*    Basename 11/16/11 1515  WBC 8.6  RBC 5.16*  HCT 47.1*  PLT 232    Basename 11/16/11 1515  NA 138  K 5.2*  CL 100  CO2 28  BUN 27*  CREATININE 1.19*  GLUCOSE 79  CALCIUM 9.8   No results found for this basename: LABPT:2,INR:2 in the last 72 hours  right ankle splinted.  NV exam unchanged.  Assessment/Plan: Continue NWB on R LE.  PT, OT, SW - SNF placement.   Toni Arthurs 11/17/2011, 7:43 AM

## 2011-11-17 NOTE — Progress Notes (Signed)
Physical Therapy Evaluation Note  Past Medical History  Diagnosis Date  . Abdominal aneurysm without mention of rupture   . Cholelithiasis   . Hyperlipidemia   . TIA (transient ischemic attack) ~ 09/2010    "[name]"  . Neuropathic pain   . Osteoporosis   . Arthritis   . Hypertension 04/30/11    "not a problem now"  . Pneumonia     "once or twice"  . Migraines   . Headache   . Peripheral neuropathy     "hands, feet, legs"  . Stroke     hx of TIA  . Chronic kidney disease     NEPHRECTOMY    Past Surgical History  Procedure Date  . Carotid endarterectomy 03/1999; 04/1999    bilateral (Dr. Edilia Bo)  . 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 Tonsillectomy     "when I was little"  . Abdominal hysterectomy 1984  . Tubal ligation 1975  . Dilation and curettage of uterus     "I've had a bunch of those"  . Colon surgery 2002    "8 inches of my colon taken out"  . Fracture surgery 02/2008    "femur; put rod & screws in"  . Fracture surgery 03/2008    "replaced screws in femur"  . Fracture surgery 04/2008    "replaced hardware S/P rebreak femur"      11/17/11 0807  PT Visit Information  Last PT Received On 11/17/11  Assistance Needed +2  PT Time Calculation  PT Start Time 0807  PT Stop Time 0835  PT Time Calculation (min) 28 min  Subjective Data  Subjective Pt received supine in bed with increased anxiety re: OOB mobility with report "I can't walk". Patient reports of pain in R LE but did not rate  Precautions  Precautions Fall  Restrictions  Weight Bearing Restrictions Yes  RLE Weight Bearing NWB  Home Living  Lives With Son  Available Help at Discharge (none- son travels freq)  Type of Home (CONDO)  Home Access Ramped entrance  Home Layout One level  Bathroom Shower/Tub Walk-in shower  Bathroom Toilet Handicapped height  Bathroom Accessibility No  Home Adaptive Equipment Walker - rolling;Wheelchair - manual (pt poor  historian)  Additional Comments pt alone during day and stays in w/c all day, patient with inconsistent reports as far as mobility and capability to prepare self meals and transfer technique used for in/out of bed and on/off commode.  Prior Function  Level of Independence Needs assistance  Needs Assistance Bathing;Dressing;Meal Prep;Light Housekeeping  Bath Moderate  Dressing Minimal  Meal Prep Moderate  Light Housekeeping Total  Able to Take Stairs? No  Driving No  Communication  Communication (delayed processing)  Cognition  Overall Cognitive Status Impaired  Area of Impairment Attention;Memory;Following commands  Arousal/Alertness Awake/alert  Orientation Level Oriented X4 / Intact  Behavior During Session Anxious  Current Attention Level Selective  Memory Deficits inconsistent with PTA mobility and DME report  Following Commands Follows one step commands consistently;Follows multi-step commands inconsistently  Right Upper Extremity Assessment  RUE ROM/Strength/Tone WFL  Left Upper Extremity Assessment  LUE ROM/Strength/Tone WFL  Right Lower Extremity Assessment  RLE ROM/Strength/Tone Deficits (grossly 3-/5, pt NWB R LE)  Left Lower Extremity Assessment  LLE ROM/Strength/Tone Deficits;Due to pain  LLE ROM/Strength/Tone Deficits grossly 3+/5  Trunk Assessment  Trunk Assessment Normal  Bed Mobility  Bed Mobility Rolling Left;Supine to Sit;Sit to Supine  Rolling Left  2: Max assist;With rail (due to pain)  Supine to Sit 1: +2 Total assist;With rails;HOB elevated  Supine to Sit: Patient Percentage 40%  Sit to Supine 1: +2 Total assist;With rail;HOB elevated  Sit to Supine: Patient Percentage 40%  Details for Bed Mobility Assistance pt with anxiety and pain in R LE  Transfers  Transfers Not assessed (attempted sit --> stand x 2 however unsuccessful due to patient with increased anxiety and + posterior push/resistance)  PT - End of Session  Equipment Utilized During  Treatment Gait belt  Activity Tolerance Patient limited by pain  Patient left in bed;with call bell/phone within reach  Nurse Communication Mobility status;Need for lift equipment  PT Assessment  Clinical Impression Statement Pt s/p fall presenting with R tri-malleolar fx and is now NWB R LE. Patient mobility limited PTA however now even more impaired requiring 24/7 supervision and maxA for all mobility. Patient alone all week while son travels. Patient to benefit from SNF placement to achieve maximal functional recovery for safe transition home.  PT Recommendation/Assessment Patient needs continued PT services  PT Problem List Decreased strength;Decreased range of motion;Decreased activity tolerance;Decreased balance;Decreased mobility  Barriers to Discharge Decreased caregiver support  Barriers to Discharge Comments no assist at home  PT Therapy Diagnosis  Acute pain;Difficulty walking;Abnormality of gait;Generalized weakness  PT Plan  PT Frequency Min 3X/week  PT Treatment/Interventions DME instruction;Gait training;Functional mobility training;Therapeutic activities;Therapeutic exercise  PT Recommendation  Follow Up Recommendations Skilled nursing facility  Equipment Recommended Defer to next venue  Individuals Consulted  Consulted and Agree with Results and Recommendations Patient  Acute Rehab PT Goals  PT Goal Formulation With patient  Time For Goal Achievement 12/01/11  Potential to Achieve Goals Fair  Pt will go Supine/Side to Sit with min assist;with HOB 0 degrees  PT Goal: Supine/Side to Sit - Progress Goal set today  Pt will go Sit to Stand with mod assist;with upper extremity assist (up to RW)  PT Goal: Sit to Stand - Progress Goal set today  Pt will Transfer Bed to Chair/Chair to Bed with mod assist (with RW.)  PT Transfer Goal: Bed to Chair/Chair to Bed - Progress Goal set today  Pt will Perform Home Exercise Program Independently  PT Goal: Perform Home Exercise Program  - Progress Goal set today  Written Expression  Dominant Hand Right    Pain: reports pain in R LE and L LE but did not rate. RN reports to early to provide pain medicine.  Lewis Shock, PT, DPT Pager #: [phone] Office #: [phone]

## 2011-11-18 MED ORDER — DSS 100 MG PO CAPS: 100.0000 mg | ORAL_CAPSULE | Freq: Two times a day (BID) | ORAL | Status: DC

## 2011-11-18 MED ORDER — HYDROCODONE-ACETAMINOPHEN 5-325 MG PO TABS: 1.0000 | ORAL_TABLET | ORAL | Status: AC | PRN

## 2011-11-18 MED ORDER — HYDROCODONE-ACETAMINOPHEN 5-325 MG PO TABS
2.0000 | ORAL_TABLET | ORAL | Status: DC | PRN
  Administered 2011-11-18: 2 via ORAL
  Filled 2011-11-18: qty 2

## 2011-11-18 MED ORDER — SENNA 8.6 MG PO TABS: 1.0000 | ORAL_TABLET | Freq: Two times a day (BID) | ORAL | Status: DC

## 2011-11-18 NOTE — Clinical Social Work Placement (Signed)
     Clinical Social Work Department CLINICAL SOCIAL WORK PLACEMENT NOTE 11/18/2011  Patient:  [name], [name]  Account Number:  [id number] Admit date:  11/16/2011  Clinical Social Worker:  Lupita Leash Ricki Clack, BSW  Date/time:  11/18/2011 11:23 AM  Clinical Social Work is seeking post-discharge placement for this patient at the following level of care:   SKILLED NURSING   (*CSW will update this form in Epic as items are completed)   11/18/2011  Patient/family provided with [facility] Department of Clinical Social Works list of facilities offering this level of care within the geographic area requested by the patient (or if unable, by the patients family).  11/18/2011  Patient/family informed of their freedom to choose among providers that offer the needed level of care, that participate in Medicare, Medicaid or managed care program needed by the patient, have an available bed and are willing to accept the patient.  11/18/2011  Patient/family informed of [facility] ownership interest in [facility], as well as of the fact that they are under no obligation to receive care at this facility.  PASARR submitted to EDS on  PASARR number received from EDS on   FL2 transmitted to all facilities in geographic area requested by pt/family on  11/18/2011 FL2 transmitted to all facilities within larger geographic area on   Patient informed that his/her managed care company has contracts with or will negotiate with  certain facilities, including the following:   PASARR level A is already in place.  Pt has [employer]- Medicare Complete. Did not require 3 day hospital stay.     Patient/family informed of bed offers received:  11/18/2011 Patient chooses bed at [facility] Physician recommends and patient chooses bed at    Patient to be transferred to [facility] on  11/18/2011 Patient to be transferred to facility by [facility]  The following physician  request were entered in Epic:   Additional Comments: Patient and son are pleased with d/c plan.  Notified SNF and nursing of above.

## 2011-11-18 NOTE — Progress Notes (Signed)
Utilization review completed. Anette Guarneri, RN, BSN. 11/18/11

## 2011-11-18 NOTE — Discharge Instructions (Signed)
Toni Arthurs, MD [facility]  Please read the following information regarding your care after surgery.  Medications  You only need a prescription for the narcotic pain medicine (ex. oxycodone, Percocet, Norco).  All of the other medicines listed below are available over the counter. ? acetominophen (Tylenol) 650 mg every 4-6 hours as you need for minor pain ? oxycodone as prescribed for moderate to severe pain X norco as prescribed for pain   Narcotic pain medicine (ex. oxycodone, Percocet, Vicodin) will cause constipation.  To prevent this problem, take the following medicines while you are taking any pain medicine. X docusate sodium (Colace) 100 mg twice a day X senna (Senokot) 2 tablets twice a day  X To help prevent blood clots, take an aspirin (325 mg) once a day for a month after surgery.  You should also get up every hour while you are awake to move around.    Weight Bearing ? Bear weight when you are able on your operated leg or foot. ? Bear weight only on the heel of your operated foot in the post-op shoe. X Do not bear any weight on the operated leg or foot.  Cast / Splint / Dressing X Keep your splint or cast clean and dry.  Don't put anything (coat hanger, pencil, etc) down inside of it.  If it gets damp, use a hair dryer on the cool setting to dry it.  If it gets soaked, call the office to schedule an appointment for a cast change. ? Remove your dressing 3 days after surgery and cover the incisions with dry dressings.    After your dressing, cast or splint is removed; you may shower, but do not soak or scrub the wound.  Allow the water to run over it, and then gently pat it dry.  Swelling It is normal for you to have swelling where you had surgery.  To reduce swelling and pain, keep your toes above your nose for at least 3 days after surgery.  It may be necessary to keep your foot or leg elevated for several weeks.  If it hurts, it should be elevated.  Follow  Up Call my office at [phone] when you are discharged from the hospital or surgery center to schedule an appointment to be seen two weeks after surgery.  Call my office at [phone] if you develop a fever >101.5 F, nausea, vomiting, bleeding from the surgical site or severe pain.

## 2011-11-18 NOTE — Clinical Social Work Psychosocial (Signed)
     Clinical Social Work Department BRIEF PSYCHOSOCIAL ASSESSMENT 11/18/2011  Patient:  [name], [name]     Account Number:  [id number]     Admit date:  11/16/2011  Clinical Social Worker:  Burnard Hawthorne  Date/Time:  11/18/2011 11:00 AM  Referred by:  Physician  Date Referred:  11/17/2011 Referred for  SNF Placement   Other Referral:   Interview type:  Patient Other interview type:    PSYCHOSOCIAL DATA Living Status:  WITH ADULT CHILDREN Admitted from facility:   Level of care:   Primary support name:  Dahlia Byes  [phone] Primary support relationship to patient:   Degree of support available:   Supportive but he works long hours per patient.    CURRENT CONCERNS Current Concerns  Post-Acute Placement   Other Concerns:    SOCIAL WORK ASSESSMENT / PLAN CSW recevied referral to assist wiht dc planning. Chart reviewed and SNF is recommended by PT.  Met with patient- she is agreeable to short term rehab- then hopes to go to [facility]- ALF.  She has been a resident at [facility] multiple times in the past and would like to return there if possible. Discussed need for other  bed search in case [facility] was unable to assist. Pt. is agreeable.  Per MD- patient is medically ready for d/c.   Assessment/plan status:  Psychosocial Support/Ongoing Assessment of Needs Other assessment/ plan:   Information/referral to community resources:   Patient is applied Medicaid.    PATIENTS/FAMILYS RESPONSE TO PLAN OF CARE: Patient and son are both agreeable to SNF placement. Son is concerned that patient will require long term care as he is not able to provide care for her at home due to his job responsibilities.

## 2011-11-18 NOTE — Discharge Summary (Signed)
Physician Discharge Summary  Patient ID: [name] MRN: [id number] DOB/AGE: [birth year] 76 y.o.  Admit date: 11/16/2011 Discharge date: 11/18/2011  Admission Diagnoses:  Right ankle fracture, peripheral neuropathy, TIA, AAA, carotid stenosis  Discharge Diagnoses:  Right ankle fracture, peripheral neuropathy, TIA, AAA, carotid stenosis  Discharged Condition: stable  Hospital Course: Pt was admitted to the hospital after a fall at home in which she sustained a bimalleolar ankle fracture on the right.  She declined any surgical treatment.  The fracture is reasonably well aligned, so we'll proceed with closed treatment in a splint.  She is ambulatory at home only for transfers.  Her son is her primary caregiver, and he travels for work.  She is admitted for PT, OT and SW consults for placement in a SNF for her convalescence and rehabilitation.  Consults: None  Significant Diagnostic Studies: radiology: X-Ray: ankle films show a bimal fracture  Treatments: therapies: PT and OT  Discharge Exam: Blood pressure 130/73, pulse 85, temperature 98.2 F (36.8 C), temperature source Oral, resp. rate 20, height 5\' 3"  (1.6 m), weight 73.483 kg (162 lb), SpO2 93.00%. Elderly female in nad.  A and O x 4.  Mood and affect normal.  EOMI.  Respirations unlabored.  R ankle immobilized in a splint.  Wiggles toes and feels LT (diminished).    Disposition: 03-Skilled Nursing Facility  Discharge Orders    Future Appointments: Provider: Department: Dept Phone: Center:   01/14/2012 9:00 AM [facility] [phone] [facility]   01/14/2012 9:30 AM Chuck Hint, MD [facility] [phone] [facility]     Future Orders Please Complete By Expires   Diet - low sodium heart healthy      Call MD / Call 911      Comments:   If you experience chest pain or shortness of breath, CALL 911 and be transported to the hospital emergency room.  If you develope a fever above 101 F, pus (white drainage) or  increased drainage or redness at the wound, or calf pain, call your surgeon's office.   Constipation Prevention      Comments:   Drink plenty of fluids.  Prune juice may be helpful.  You may use a stool softener, such as Colace (over the counter) 100 mg twice a day.  Use MiraLax (over the counter) for constipation as needed.   Increase activity slowly as tolerated        Medication List  As of 11/18/2011  8:28 AM   TAKE these medications         acetaminophen 500 MG tablet   Commonly known as: TYLENOL   Take 1,000 mg by mouth every 6 (six) hours as needed. For headache/pain.      aspirin EC 325 MG tablet   Take 325 mg by mouth every morning.      CREON 24000 UNITS Cpep   Generic drug: Pancrelipase (Lip-Prot-Amyl)   Take 2 capsules by mouth 3 (three) times daily before meals.      DSS 100 MG Caps   Take 100 mg by mouth 2 (two) times daily.      ezetimibe 10 MG tablet   Commonly known as: ZETIA   Take 10 mg by mouth daily.      fish oil-omega-3 fatty acids 1000 MG capsule   Take 2 g by mouth every morning.      HYDROcodone-acetaminophen 5-325 MG per tablet   Commonly known as: NORCO   Take 1 tablet by mouth every 4 (four)  hours as needed.      imipramine 50 MG tablet   Commonly known as: TOFRANIL   Take 50 mg by mouth at bedtime.      pregabalin 75 MG capsule   Commonly known as: LYRICA   Take 75 mg by mouth 2 (two) times daily.      rosuvastatin 40 MG tablet   Commonly known as: CRESTOR   Take 40 mg by mouth daily with supper.      senna 8.6 MG Tabs   Commonly known as: SENOKOT   Take 1 tablet (8.6 mg total) by mouth 2 (two) times daily.      ZYRTEC ALLERGY 10 MG tablet   Generic drug: cetirizine   Take 10 mg by mouth every morning.           Follow-up Information    Follow up with Samatha Anspach, Jonny Ruiz, MD. Schedule an appointment as soon as possible for a visit in 10 days.   Contact information:   [address] [place]  [address] [phone]         Pt will need PT, OT consults for ambulation and ADLs.  She'll be NWB on the R LE.  Keep splint clean and dry.  Elevate R foot "toes above the nose" as much as possible.  Call [phone] to schedule a f/u appt with me in 10-14 days.  SignedToni Arthurs 11/18/2011, 8:28 AM

## 2011-11-19 ENCOUNTER — Emergency Department (HOSPITAL_COMMUNITY): Payer: Medicare Other

## 2011-11-19 ENCOUNTER — Emergency Department (HOSPITAL_COMMUNITY)
Admission: EM | Admit: 2011-11-19 | Discharge: 2011-11-19 | Disposition: A | Discharge status: Skilled Nursing Facility | Payer: Medicare Other | Attending: Emergency Medicine | Admitting: Emergency Medicine

## 2011-11-19 ENCOUNTER — Encounter (HOSPITAL_COMMUNITY): Payer: Self-pay | Admitting: *Deleted

## 2011-11-19 DIAGNOSIS — J84112 Idiopathic pulmonary fibrosis: Secondary | ICD-10-CM

## 2011-11-19 DIAGNOSIS — Z8673 Personal history of transient ischemic attack (TIA), and cerebral infarction without residual deficits: Secondary | ICD-10-CM | POA: Insufficient documentation

## 2011-11-19 DIAGNOSIS — I10 Essential (primary) hypertension: Secondary | ICD-10-CM | POA: Insufficient documentation

## 2011-11-19 DIAGNOSIS — E785 Hyperlipidemia, unspecified: Secondary | ICD-10-CM | POA: Insufficient documentation

## 2011-11-19 DIAGNOSIS — Z1389 Encounter for screening for other disorder: Secondary | ICD-10-CM | POA: Insufficient documentation

## 2011-11-19 DIAGNOSIS — J8410 Pulmonary fibrosis, unspecified: Secondary | ICD-10-CM | POA: Insufficient documentation

## 2011-11-19 LAB — CBC
HCT: 39.4 % (ref 36.0–46.0)
Hemoglobin: 12.4 g/dL (ref 12.0–15.0)
MCH: 28.5 pg (ref 26.0–34.0)
MCHC: 31.5 g/dL (ref 30.0–36.0)
MCV: 90.6 fL (ref 78.0–100.0)

## 2011-11-19 LAB — POCT I-STAT, CHEM 8
Chloride: 101 mEq/L (ref 96–112)
Glucose, Bld: 103 mg/dL — ABNORMAL HIGH (ref 70–99)
HCT: 41 % (ref 36.0–46.0)
Hemoglobin: 13.9 g/dL (ref 12.0–15.0)
Potassium: 4.9 mEq/L (ref 3.5–5.1)
Sodium: 137 mEq/L (ref 135–145)

## 2011-11-19 LAB — BLOOD GAS, ARTERIAL
Acid-Base Excess: 0.6 mmol/L (ref 0.0–2.0)
Drawn by: 331471
Patient temperature: 98.6
pCO2 arterial: 44.2 mmHg (ref 35.0–45.0)
pH, Arterial: 7.379 (ref 7.350–7.400)
pO2, Arterial: 49.9 mmHg — ABNORMAL LOW (ref 80.0–100.0)

## 2011-11-19 LAB — DIFFERENTIAL
Basophils Relative: 0 % (ref 0–1)
Eosinophils Relative: 14 % — ABNORMAL HIGH (ref 0–5)
Monocytes Absolute: 0.7 10*3/uL (ref 0.1–1.0)
Monocytes Relative: 8 % (ref 3–12)
Neutro Abs: 4.5 10*3/uL (ref 1.7–7.7)

## 2011-11-19 MED ORDER — SODIUM CHLORIDE 0.9 % IV BOLUS (SEPSIS)
1000.0000 mL | Freq: Once | INTRAVENOUS | Status: AC
  Administered 2011-11-19: 1000 mL via INTRAVENOUS

## 2011-11-19 MED ORDER — IOHEXOL 300 MG/ML  SOLN
80.0000 mL | Freq: Once | INTRAMUSCULAR | Status: AC | PRN
  Administered 2011-11-19: 80 mL via INTRAVENOUS

## 2011-11-19 NOTE — ED Notes (Signed)
NWG:[id number]<ZH> Expected date:11/19/11<BR> Expected time: 5:46 AM<BR> Means of arrival:Ambulance<BR> Comments:<BR> O2 sats 94, want evaluation for a PE in the &quot;ankle&quot;

## 2011-11-19 NOTE — ED Provider Notes (Signed)
History     CSN: [id number]  Arrival date & time 11/19/11  0559   6:11 AM HPI Patient has a significant history of an ankle fracture 5 days ago. Was placed in a splint and sent to nursing facility for rehabilitation. Reports nursing staff was checking vital signs and oxygen saturation O2 sats were in the 80s. Facility since patient to emergency department to rule out DVT due to extended immobility. Patient denies shortness of breath, chest pain, calf pain, or significant ankle pain. Denies prior history of PE or DVT. Patient is not on anticoagulants. HPI  Past Medical History  Diagnosis Date  . Abdominal aneurysm without mention of rupture   . Cholelithiasis   . Hyperlipidemia   . TIA (transient ischemic attack) ~ 09/2010    "[name]"  . Neuropathic pain   . Osteoporosis   . Arthritis   . Hypertension 04/30/11    "not a problem now"  . Pneumonia     "once or twice"  . Migraines   . Headache   . Peripheral neuropathy     "hands, feet, legs"  . Stroke     hx of TIA  . Chronic kidney disease     NEPHRECTOMY    Past Surgical History  Procedure Date  . Carotid endarterectomy 03/1999; 04/1999    bilateral (Dr. Edilia Bo)  . 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 Tonsillectomy     "when I was little"  . Abdominal hysterectomy 1984  . Tubal ligation 1975  . Dilation and curettage of uterus     "I've had a bunch of those"  . Colon surgery 2002    "8 inches of my colon taken out"  . Fracture surgery 02/2008    "femur; put rod & screws in"  . Fracture surgery 03/2008    "replaced screws in femur"  . Fracture surgery 04/2008    "replaced hardware S/P rebreak femur"    Family History  Problem Relation Age of Onset  . Diabetes Mother   . Heart disease Mother   . Hypertension Mother   . Hyperlipidemia Mother   . Aneurysm Father   . Diabetes Brother     History  Substance Use Topics  . Smoking status: Current Everyday Smoker -- 0.2  packs/day for 60 years    Types: Cigarettes  . Smokeless tobacco: Never Used  . Alcohol Use: No    OB History    Grav Para Term Preterm Abortions TAB SAB Ect Mult Living                  Review of Systems  Constitutional: Negative for fever and chills.  Respiratory: Negative for cough, shortness of breath and wheezing.   Cardiovascular: Negative for chest pain.  Gastrointestinal: Negative for nausea and vomiting.  Musculoskeletal:       Denies calf pain, ankle pain, lower extremity swelling  All other systems reviewed and are negative.    Allergies  Codeine and Penicillins  Home Medications   Current Outpatient Rx  Name Route Sig Dispense Refill  . ACETAMINOPHEN 500 MG PO TABS Oral Take 1,000 mg by mouth every 6 (six) hours as needed. For headache/pain.    . ASPIRIN EC 325 MG PO TBEC Oral Take 325 mg by mouth every morning.    Marland Kitchen CETIRIZINE HCL 10 MG PO TABS Oral Take 10 mg by mouth every morning.     Marland Kitchen CREON 24000 UNITS PO  CPEP Oral Take 2 capsules by mouth 3 (three) times daily before meals.     . DSS 100 MG PO CAPS Oral Take 100 mg by mouth 2 (two) times daily. 30 capsule 0  . EZETIMIBE 10 MG PO TABS Oral Take 10 mg by mouth daily.      . OMEGA-3 FATTY ACIDS 1000 MG PO CAPS Oral Take 2 g by mouth every morning.    Marland Kitchen HYDROCODONE-ACETAMINOPHEN 5-325 MG PO TABS Oral Take 1 tablet by mouth every 4 (four) hours as needed. 30 tablet 0  . IMIPRAMINE HCL 50 MG PO TABS Oral Take 50 mg by mouth at bedtime.      Marland Kitchen PREGABALIN 75 MG PO CAPS Oral Take 75 mg by mouth 2 (two) times daily.      Marland Kitchen ROSUVASTATIN CALCIUM 40 MG PO TABS Oral Take 40 mg by mouth daily with supper.     . SENNA 8.6 MG PO TABS Oral Take 1 tablet (8.6 mg total) by mouth 2 (two) times daily. 30 each 0    There were no vitals taken for this visit.  Physical Exam  Vitals reviewed. Constitutional: She is oriented to person, place, and time. Vital signs are normal. She appears well-developed and well-nourished.    HENT:  Head: Normocephalic and atraumatic.  Eyes: Conjunctivae are normal. Pupils are equal, round, and reactive to light.  Neck: Normal range of motion. Neck supple.  Cardiovascular: Normal rate, regular rhythm and normal heart sounds.  Exam reveals no friction rub.   No murmur heard. Pulmonary/Chest: Effort normal and breath sounds normal. She has no wheezes. She has no rhonchi. She has no rales. She exhibits no tenderness.  Musculoskeletal: Normal range of motion.       Right lower extremity: Normal pulse palpated in right great toe and popliteal pulse. Normal capillary refill. No edema or erythema.  Neurological: She is alert and oriented to person, place, and time. Coordination normal.  Skin: Skin is warm and dry. No rash noted. No erythema. No pallor.    ED Course  Procedures  ED ECG REPORT   Date: 11/19/2011  EKG Time: 7:49 AM  Rate: 89  Rhythm: Sinus rhythm,  unchanged from previous tracings  Axis: Normal  Intervals:left bundle branch block  ST&T Change: None  Narrative Interpretation: No significant changes since 11/16/11    Results for orders placed during the hospital encounter of 11/19/11  CBC      Component Value Range   WBC 8.9  4.0 - 10.5 (K/uL)   RBC 4.35  3.87 - 5.11 (MIL/uL)   Hemoglobin 12.4  12.0 - 15.0 (g/dL)   HCT 16.1  09.6 - 04.5 (%)   MCV 90.6  78.0 - 100.0 (fL)   MCH 28.5  26.0 - 34.0 (pg)   MCHC 31.5  30.0 - 36.0 (g/dL)   RDW 40.9  81.1 - 91.4 (%)   Platelets 193  150 - 400 (K/uL)  DIFFERENTIAL      Component Value Range   Neutrophils Relative 50  43 - 77 (%)   Neutro Abs 4.5  1.7 - 7.7 (K/uL)   Lymphocytes Relative 27  12 - 46 (%)   Lymphs Abs 2.4  0.7 - 4.0 (K/uL)   Monocytes Relative 8  3 - 12 (%)   Monocytes Absolute 0.7  0.1 - 1.0 (K/uL)   Eosinophils Relative 14 (*) 0 - 5 (%)   Eosinophils Absolute 1.3 (*) 0.0 - 0.7 (K/uL)   Basophils Relative 0  0 - 1 (%)   Basophils Absolute 0.0  0.0 - 0.1 (K/uL)  POCT I-STAT, CHEM 8      Component  Value Range   Sodium 137  135 - 145 (mEq/L)   Potassium 4.9  3.5 - 5.1 (mEq/L)   Chloride 101  96 - 112 (mEq/L)   BUN 25 (*) 6 - 23 (mg/dL)   Creatinine, Ser 1.61 (*) 0.50 - 1.10 (mg/dL)   Glucose, Bld 096 (*) 70 - 99 (mg/dL)   Calcium, Ion 0.45  4.09 - 1.32 (mmol/L)   TCO2 26  0 - 100 (mmol/L)   Hemoglobin 13.9  12.0 - 15.0 (g/dL)   HCT 81.1  91.4 - 78.2 (%)  BLOOD GAS, ARTERIAL      Component Value Range   O2 Content ROOM AIR     pH, Arterial 7.379  7.350 - 7.400    pCO2 arterial 44.2  35.0 - 45.0 (mmHg)   pO2, Arterial 49.9 (*) 80.0 - 100.0 (mmHg)   Bicarbonate 25.5 (*) 20.0 - 24.0 (mEq/L)   TCO2 23.2  0 - 100 (mmol/L)   Acid-Base Excess 0.6  0.0 - 2.0 (mmol/L)   O2 Saturation 83.3     Patient temperature 98.6     Collection site BRACHIAL ARTERY     Drawn by [id number]     Sample type ARTERIAL DRAW     Allens test (pass/fail) PASS  PASS    Dg Chest 2 View  11/16/2011  *RADIOLOGY REPORT*  Clinical Data: Preop radiograph  CHEST - 2 VIEW  Comparison: 08/29/2011  Findings: Heart size is normal.  No pleural effusion or edema.  No airspace consolidation identified.  The lung volumes are low. Scar like densities noted the left base.  Review of the review of the visualized osseous structures is unremarkable.  IMPRESSION:  1.  No acute cardiopulmonary abnormalities. 2.  Low lung volumes.  Original Report Authenticated By: Rosealee Albee, M.D.   Dg Ankle Complete Right  11/16/2011  *RADIOLOGY REPORT*  Clinical Data: 78 year old female with pain following ankle injury 3 weeks ago.  Nonhealing anterior leg wound.  RIGHT ANKLE - COMPLETE 3+ VIEW  Comparison: None.  Findings: Comminuted mildly displaced fractures of the medial and lateral malleolus.  Overall mortise joint alignment is preserved. The talar dome is intact.  On the lateral view there may also be a small impaction type fracture of the posterior malleolus. Calcaneus appears intact.  Calcified atherosclerosis in the right lower extremity.   IMPRESSION: Trimalleolar comminuted ankle fracture, but minimally-displaced and without significant mortise joint subluxation.  Talar dome appears intact.  Original Report Authenticated By: Harley Hallmark, M.D.   Ct Angio Chest W/cm &/or Wo Cm  11/19/2011  *RADIOLOGY REPORT*  Clinical Data: Decrease that is sats.  Dilate for pulmonary embolus.  CT ANGIOGRAPHY CHEST  Technique:  Multidetector CT imaging of the chest using the standard protocol during bolus administration of intravenous contrast. Multiplanar reconstructed images including MIPs were obtained and reviewed to evaluate the vascular anatomy.  Contrast: 80mL OMNIPAQUE IOHEXOL 300 MG/ML  SOLN  Comparison: 07/18/2010.  Findings: There are no filling defects within the opacified pulmonary arteries to suggest the presence of an acute pulmonary embolus.  No thoracic aortic aneurysm.  There is no dissection of the thoracic aorta.  No axillary, mediastinal, or hilar lymphadenopathy.  The heart size is normal.  No evidence for pericardial or pleural effusion.  Lung windows demonstrate bilateral upper lobe emphysema.  There is some thickening of  the interlobular septa bilaterally.  Compressive atelectasis is noted dependently in both lungs.  There is evidence of subpleural honeycombing in the lower lobes bilaterally.  Bone windows reveal no worrisome lytic or sclerotic osseous lesions.  IMPRESSION: No CT evidence for acute pulmonary embolus.  Emphysema with bilateral subpleural honeycombing, compatible with UIP.  There is some interlobular septal thickening and both lungs raising the question interstitial pulmonary edema.  Original Report Authenticated By: ERIC A. MANSELL, M.D.   Dg Chest Port 1 View  11/19/2011  *RADIOLOGY REPORT*  Clinical Data: Hypoxia and weakness.  PORTABLE CHEST - 1 VIEW  Comparison: 11/16/2011.  Findings: The cardiac silhouette, mediastinal and hilar contours are stable.  There are chronic bronchitic type lung changes but no infiltrates,  edema or effusions.  The bony thorax is intact.  IMPRESSION: Chronic bronchitic type lung changes but no acute pulmonary findings.  Original Report Authenticated By: P. Loralie Champagne, M.D.            MDM   7:35 AM  Discussed patient with Dr. Hyacinth Meeker. Do to decreasing O2 sats and recent history of immobility will continue to evaluate for pulmonary embolism. Patient does have an elevated creatinine of 1.3. Will get a liter of fluids and obtain CT angiogram of the chest. Patient voices understanding and agrees to plan.   11:11 AM  PAtient's labs have returned and  Po2 is slightly low at 49.9 O2 sats are stable on room air and patient states she is ready to leave.   12:00 PM Discussed labs and imaging with patient. Advised of diagnosis of pulmonary fibrosis and Usual interstitial pneumonia. Advised due to additional finding of decreased O2, she will have to follow-up with a pulmonologist for further management. I have included this in her paperwork to send back with her to [facility] since patient agrees but then later states "It will go away if I stop smoking for 10 days". Advised patient this is not the case and she will need follow-up. Patient is ready for discuss        Thomasene Lot, Cordelia Poche 11/20/11 1610

## 2011-11-19 NOTE — ED Notes (Signed)
Pt here from nursing facility s/p low oxygen level on RA - pt A&Ox4 in no acute distress at present, pt denies any pain or shortness of breath - states she was sent here by the facility b/c they didn't want her there. Pt w/ cast to RLE - CMS intact. Pt currently on 3L O2 via [place].

## 2011-11-19 NOTE — ED Notes (Signed)
Contacted [facility] to give report.  Spoke to [place], notified her re use of O2.

## 2011-11-19 NOTE — ED Notes (Signed)
Per EMS - pt from [facility] facility - per facility staff pt was having VS taken this a.m. And was noted to have low O2 level - pt put on [place] via O2 and oxygen level returned to norma. Pt w/o any complaints at presents - pt w/ cast to rt ankle d/t fx from last Saturday - facility requesting pt transport to ED to r/o PE.

## 2011-11-19 NOTE — ED Notes (Signed)
Pt care assumed, obtained verbal report.  Pt resting comfortably at this time.  Pt notified that this nurse will be starting an IV so that she will get some fluids.  Pt is asking why she need an IV because she does not want to stay, that she wants to go back to [name].  [name] EDPA made aware.

## 2011-11-19 NOTE — ED Notes (Signed)
Results for orders placed during the hospital encounter of 11/16/11  CBC      Component Value Range   WBC 8.6  4.0 - 10.5 (K/uL)   RBC 5.16 (*) 3.87 - 5.11 (MIL/uL)   Hemoglobin 15.1 (*) 12.0 - 15.0 (g/dL)   HCT 13.0 (*) 86.5 - 46.0 (%)   MCV 91.3  78.0 - 100.0 (fL)   MCH 29.3  26.0 - 34.0 (pg)   MCHC 32.1  30.0 - 36.0 (g/dL)   RDW 78.4  69.6 - 29.5 (%)   Platelets 232  150 - 400 (K/uL)  BASIC METABOLIC PANEL      Component Value Range   Sodium 138  135 - 145 (mEq/L)   Potassium 5.2 (*) 3.5 - 5.1 (mEq/L)   Chloride 100  96 - 112 (mEq/L)   CO2 28  19 - 32 (mEq/L)   Glucose, Bld 79  70 - 99 (mg/dL)   BUN 27 (*) 6 - 23 (mg/dL)   Creatinine, Ser 2.84 (*) 0.50 - 1.10 (mg/dL)   Calcium 9.8  8.4 - 13.2 (mg/dL)   GFR calc non Af Amer 44 (*) >90 (mL/min)   GFR calc Af Amer 50 (*) >90 (mL/min)   Pt is an elderly female who is several days s/p frx of the R ankle - trimal who has been splinted and is to f/u as outpt with ortho - she was noted to have low sat's in the mid 80% overnight - given supplemental O2 and sent to ED for eval of possible sources.  Exam - lungs clear, no distress, sat's 90%, no tachypnea, no rales, no wheezing, abd soft, no tachycardia, no peripheral edema, normal sensation andmotor of the toes on the R.  Assessment:  R/o pna / PE or other source of hypoxia.  Medical screening examination/treatment/procedure(s) were conducted as a shared visit with non-physician practitioner(s) and myself.  I personally evaluated the patient during the encounter    Dg Ankle Complete Right  11/16/2011  *RADIOLOGY REPORT*  Clinical Data: 78 year old female with pain following ankle injury 3 weeks ago.  Nonhealing anterior leg wound.  RIGHT ANKLE - COMPLETE 3+ VIEW  Comparison: None.  Findings: Comminuted mildly displaced fractures of the medial and lateral malleolus.  Overall mortise joint alignment is preserved. The talar dome is intact.  On the lateral view there may also be a small  impaction type fracture of the posterior malleolus. Calcaneus appears intact.  Calcified atherosclerosis in the right lower extremity.  IMPRESSION: Trimalleolar comminuted ankle fracture, but minimally-displaced and without significant mortise joint subluxation.  Talar dome appears intact.  Original Report Authenticated By: Harley Hallmark, M.D.      Vida Roller, MD 11/19/11 [phone]

## 2011-11-19 NOTE — Discharge Instructions (Signed)
Please use oxygen if possible at the nursing home and unable to do this I recommend followup as soon as possible with pulmonologist for further management of pulmonary fibrosis below as information regarding pulmonary fibrosis. This is the likely reason why her position saturation rate have been dropping.  Idiopathic Pulmonary Fibrosis Idiopathic pulmonary fibrosis is an inflammation (soreness and irritation) in the lungs which eventually causes scarring. This usually shows in middle age over a several year time period. The cause is unknown (idiopathic). Usually death occurs after several years but there are no time tables which will predict perfectly what the course of the illness will be. It affects males and females equally. In this condition there is a formation of fibrous (tough leathery) tissue in the small ducts which carry air to and from your lungs. Because of this, the lungs do not work as well as they should for taking in oxygen from the air you breathe and getting rid of wastes (carbon dioxide). Because the lungs are damaged, there may be more problems with infections or the heart.  Other problems may include pulmonary hypertension (high blood pressure in the lungs), and formation of blood clots. Some symptoms of this illness are:  Coughing and breathing difficulties; cough is usually dry and hacking.   Bluish (cyanotic) skin and lips due to lack of circulating oxygen.   Loss of appetite.   Loss of strength which comes from just the increased work of breathing.   Rapid, shallow breathing occur with moderate exercise and later even while resting.   Increasing shortness of breath (dyspnea) which progresses as the disease gets worse.   Weight loss and fatigue due partly to the increased work of breathing.   Clubbing of the fingers (the ends of the fingers become rounded and enlarged).  DIAGNOSIS  A diagnosis of idiopathic pulmonary fibrosis may be suspected based on exam and a  patient's history.  Specialized X-rays, pulmonary function tests, pulse oximetry, and laboratory tests including blood gasses help confirm the problem.   Sometimes a biopsy is done and a small piece of lung tissue is removed. This may be done through a bronchoscope or during an operation in which the chest is opened. This is looked at under a microscope by a specialist who can tell what the lung problem is.  CAUSES If the cause of pulmonary fibrosis is known, it is no longer known as idiopathic. Several causes of pulmonary fibrosis include:  Occupational and environmental exposures to asbestos, silica, and or metal dusts.   Illegal or street drug use.   Agricultural workers may inhale substances, such as moldy hay, which can cause an allergic reaction in the lung. This reaction is called Farmer's Lung and can cause pulmonary fibrosis. Some other fumes found on farms are directly toxic to the lungs.   Exposure of the lungs to radiation.   Collagen diseases.   Sarcoidosis is a disease which forms granulomas (areas of inflammatory cells), which can attack any area of the body but most frequently affects the lungs.   Drugs. Certain medicines may have the undesirable side effect of causing pulmonary fibrosis. Check with your doctor about the medicines you are taking and ask about any possible side effects.   Some cases of pulmonary fibrosis seem to be genetic.  TREATMENT   There are no drugs currently approved for the treatment of pulmonary fibrosis. Steroids (a potent medication which cuts down on inflammation) are sometimes given to prevent lung changes before they become permanent. High  doses may be recommended at first, followed by lower maintenance dosages. Other medications may be tried if steroids do not work.   Lung disease may be monitored with X-rays and laboratory work.   Oxygen may be helpful if oxygen in the blood is diminished. This improves the quality of life. Your caregiver  will give you a prescription for this if it is helpful.   Antibiotics are used for treatment of infections.   Exercise may be beneficial.   Lung transplants are being investigated and a single lung transplant may be considered for some patients.   Influenza vaccine and pneumococcal pneumonia vaccine are both recommended for people with IPF or any lung disease. These two shots may help keep you healthy.  Document Released: 08/23/2003 Document Revised: 05/22/2011 Document Reviewed: 06/02/2005 [facility] Patient Information 2012 [place], [place].

## 2011-11-19 NOTE — ED Notes (Signed)
Spoke to pt's son per his request outside pt's room.  He reports that he wanted to tell the [name] staff when they notified him this am that pt was being transferred to the ED that it will be a waste of time.  He reports that pt's oxygen fluctuating is normal for pt.  Pt is a smoker per her son.  He reports that pt is being verbally aggressive towards him today while he's in the room to check on him.

## 2011-11-19 NOTE — ED Notes (Signed)
Bridgette EDPA went in the room to explain to pt the need of IV access.  Pt agreed to have IV started.

## 2011-11-19 NOTE — ED Notes (Signed)
[facility] contacted for transfer back to [facility]

## 2011-11-26 NOTE — ED Provider Notes (Signed)
Medical screening examination/treatment/procedure(s) were conducted as a shared visit with non-physician practitioner(s) and myself.  I personally evaluated the patient during the encounter  Please see my separate respective documentation pertaining to this patient encounter   Vida Roller, MD 11/26/11 2156

## 2011-12-12 ENCOUNTER — Institutional Professional Consult (permissible substitution): Payer: Medicare Other | Admitting: Pulmonary Disease

## 2012-01-13 ENCOUNTER — Encounter: Payer: Self-pay | Admitting: Neurosurgery

## 2012-01-14 ENCOUNTER — Ambulatory Visit: Payer: Medicare Other | Admitting: Neurosurgery

## 2012-01-14 ENCOUNTER — Encounter: Payer: Medicare Other | Admitting: *Deleted

## 2012-06-10 ENCOUNTER — Other Ambulatory Visit: Payer: Self-pay | Admitting: Endocrinology

## 2012-06-10 DIAGNOSIS — R52 Pain, unspecified: Secondary | ICD-10-CM

## 2012-06-10 DIAGNOSIS — I714 Abdominal aortic aneurysm, without rupture, unspecified: Secondary | ICD-10-CM

## 2012-06-14 ENCOUNTER — Ambulatory Visit
Admission: RE | Admit: 2012-06-14 | Discharge: 2012-06-14 | Disposition: A | Discharge status: Home / Self Care | Payer: Medicare Other | Source: Ambulatory Visit | Attending: Endocrinology | Admitting: Endocrinology

## 2012-06-14 DIAGNOSIS — I714 Abdominal aortic aneurysm, without rupture: Secondary | ICD-10-CM

## 2012-06-14 DIAGNOSIS — R52 Pain, unspecified: Secondary | ICD-10-CM

## 2012-08-05 ENCOUNTER — Encounter: Payer: Self-pay | Admitting: Neurosurgery

## 2012-08-06 ENCOUNTER — Encounter: Payer: Self-pay | Admitting: Neurosurgery

## 2012-08-06 ENCOUNTER — Ambulatory Visit (INDEPENDENT_AMBULATORY_CARE_PROVIDER_SITE_OTHER): Payer: Medicare Other | Admitting: Neurosurgery

## 2012-08-06 ENCOUNTER — Encounter (INDEPENDENT_AMBULATORY_CARE_PROVIDER_SITE_OTHER): Payer: Medicare Other | Admitting: Vascular Surgery

## 2012-08-06 VITALS — BP 149/81 | HR 84 | Temp 97.1°F | Resp 16 | Ht 65.0 in | Wt 162.0 lb

## 2012-08-06 DIAGNOSIS — I714 Abdominal aortic aneurysm, without rupture, unspecified: Secondary | ICD-10-CM

## 2012-08-06 DIAGNOSIS — R109 Unspecified abdominal pain: Secondary | ICD-10-CM | POA: Insufficient documentation

## 2012-08-06 NOTE — Progress Notes (Signed)
[facility] AAA/Carotid Office Note  CC: AAA surveillance Referring Physician: Imogene Burn  History of Present Illness: [name] followed for known AAA. Patient has not been seen here for evaluation since December 2012. The patient does have a remote history of bilateral CEAs with Dr. Edilia Bo in 2000. The patient's also had a cranial aneurysm repair with Dr. Lovell Sheehan. The patient's son and the patient states she does not want any further intervention as she is slow to recover and medically cannot tolerate another procedure.  Past Medical History  Diagnosis Date  . Abdominal aneurysm without mention of rupture   . Cholelithiasis   . Hyperlipidemia   . TIA (transient ischemic attack) ~ 09/2010    "[name]"  . Neuropathic pain   . Osteoporosis   . Arthritis   . Hypertension 04/30/11    "not a problem now"  . Pneumonia     "once or twice"  . Migraines   . Headache   . Peripheral neuropathy     "hands, feet, legs"  . Stroke     hx of TIA  . Chronic kidney disease     NEPHRECTOMY    ROS: [x]  Positive   [ ]  Denies    General: [ ]  Weight loss, [ ]  Fever, [ ]  chills Neurologic: [ ]  Dizziness, [ ]  Blackouts, [ ]  Seizure [ ]  Stroke, [ ]  "Mini stroke", [ ]  Slurred speech, [ ]  Temporary blindness; [ ]  weakness in arms or legs, [ ]  Hoarseness Cardiac: [ ]  Chest pain/pressure, [ ]  Shortness of breath at rest [ ]  Shortness of breath with exertion, [ ]  Atrial fibrillation or irregular heartbeat Vascular: [ ]  Pain in legs with walking, [ ]  Pain in legs at rest, [ ]  Pain in legs at night,  [ ]  Non-healing ulcer, [ ]  Blood clot in vein/DVT,   Pulmonary: [ ]  Home oxygen, [ ]  Productive cough, [ ]  Coughing up blood, [ ]  Asthma,  [ ]  Wheezing Musculoskeletal:  [ ]  Arthritis, [ ]  Low back pain, [ ]  Joint pain Hematologic: [ ]  Easy Bruising, [ ]  Anemia; [ ]  Hepatitis Gastrointestinal: [ ]  Blood in stool, [ ]   Gastroesophageal Reflux/heartburn, [ ]  Trouble swallowing Urinary: [ ]  chronic Kidney disease, [ ]  on HD - [ ]  MWF or [ ]  TTHS, [ ]  Burning with urination, [ ]  Difficulty urinating Skin: [ ]  Rashes, [ ]  Wounds Psychological: [ ]  Anxiety, [ ]  Depression   Social History History  Substance Use Topics  . Smoking status: Current Every Day Smoker -- 0.25 packs/day for 60 years    Types: Cigarettes  . Smokeless tobacco: Never Used  . Alcohol Use: No    Family History Family History  Problem Relation Age of Onset  . Diabetes Mother   . Heart disease Mother   . Hypertension Mother   . Hyperlipidemia Mother   . Aneurysm Father   . Diabetes Brother     Allergies  Allergen Reactions  . Codeine Rash  . Penicillins Rash    Current Outpatient Prescriptions  Medication Sig Dispense Refill  . amLODipine (NORVASC) 2.5 MG tablet       . aspirin EC 325 MG tablet Take 325 mg by mouth every morning.      . bisacodyl (DULCOLAX) 10 MG suppository Place 10 mg rectally as needed for constipation.      Marland Kitchen CREON 24000 UNITS CPEP Take 2 capsules by  mouth 3 (three) times daily before meals.       . fish oil-omega-3 fatty acids 1000 MG capsule Take 2 g by mouth every morning.      Marland Kitchen guaiFENesin (MUCINEX) 600 MG 12 hr tablet Take 600 mg by mouth 2 (two) times daily.      Marland Kitchen HYDROcodone-acetaminophen (NORCO/VICODIN) 5-325 MG per tablet       . imipramine (TOFRANIL) 50 MG tablet Take 50 mg by mouth at bedtime.        Marland Kitchen loratadine (CLARITIN) 10 MG tablet Take 10 mg by mouth daily.      . Multiple Vitamin (MULTIVITAMIN) tablet Take 1 tablet by mouth daily.      . pregabalin (LYRICA) 75 MG capsule Take 75 mg by mouth 2 (two) times daily.        . rosuvastatin (CRESTOR) 40 MG tablet Take 40 mg by mouth daily with supper.       . senna (SENOKOT) 8.6 MG tablet Take 1 tablet by mouth daily.      Marland Kitchen acetaminophen (TYLENOL) 500 MG tablet Take 1,000 mg by mouth every 6 (six) hours as needed. For headache/pain.       . cetirizine (ZYRTEC ALLERGY) 10 MG tablet Take 10 mg by mouth every morning.       . docusate sodium (COLACE) 100 MG capsule Take 100 mg by mouth 2 (two) times daily.      Marland Kitchen ezetimibe (ZETIA) 10 MG tablet Take 10 mg by mouth daily.        . fluconazole (DIFLUCAN) 150 MG tablet       . ipratropium-albuterol (DUONEB) 0.5-2.5 (3) MG/3ML SOLN        No current facility-administered medications for this visit.    Physical Examination  Filed Vitals:   08/06/12 1022  BP: 149/81  Pulse: 84  Temp: 97.1 F (36.2 C)  Resp: 16    Body mass index is 26.96 kg/(m^2).  General:  WDWN in NAD Gait: Normal HEENT: WNL Eyes: Pupils equal Pulmonary: normal non-labored breathing , without Rales, rhonchi,  wheezing Cardiac: RRR, without  Murmurs, rubs or gallops; Abdomen: soft, NT, no masses Skin: no rashes, ulcers noted  Vascular Exam Pulses: Palpable upper and lower extremity pulses bilaterally, abdominal pulsatile masses not palpated due to the patient's deconditioning and being in a wheelchair Carotid bruits: Carotid pulses to auscultation no bruits are heard Extremities without ischemic changes, no Gangrene , no cellulitis; no open wounds;  Musculoskeletal: no muscle wasting or atrophy   Neurologic: A&O X 3; Appropriate Affect ; SENSATION: normal; MOTOR FUNCTION:  moving all extremities equally. Speech is fluent/normal  Non-Invasive Vascular Imaging AAA duplex shows a maximum diameter of 5.39 AP by 5.1 transverse which is a significant increase from previous exam in December 2012.  ASSESSMENT/PLAN: Asymptomatic patient that may undergo a CT of the abdomen and pelvis in 3 months return to see Dr. Imogene Burn per his direction. The patient and her son state that they may not have this done and may not follow the AAA any further and I explained to them this was acceptable given her current medical condition and comorbidities. The patient and her sons questions were encouraged and answered, they're in  agreement with this plan.  [name] ANP   Clinic MD: Imogene Burn

## 2012-08-10 NOTE — Addendum Note (Signed)
Addended by: Sharee Pimple on: 08/10/2012 08:08 AM   Modules accepted: Orders

## 2012-11-09 ENCOUNTER — Encounter (INDEPENDENT_AMBULATORY_CARE_PROVIDER_SITE_OTHER): Payer: Medicare Other | Admitting: Ophthalmology

## 2012-11-09 DIAGNOSIS — H3530 Unspecified macular degeneration: Secondary | ICD-10-CM

## 2012-11-09 DIAGNOSIS — H43819 Vitreous degeneration, unspecified eye: Secondary | ICD-10-CM

## 2012-11-11 ENCOUNTER — Encounter: Payer: Self-pay | Admitting: Vascular Surgery

## 2012-11-12 ENCOUNTER — Ambulatory Visit: Payer: Medicare Other | Admitting: Vascular Surgery

## 2012-11-12 ENCOUNTER — Inpatient Hospital Stay: Admission: RE | Admit: 2012-11-12 | Payer: Medicare Other | Source: Ambulatory Visit

## 2013-06-14 ENCOUNTER — Encounter (INDEPENDENT_AMBULATORY_CARE_PROVIDER_SITE_OTHER): Payer: Medicare Other | Admitting: Ophthalmology

## 2013-06-14 DIAGNOSIS — H3530 Unspecified macular degeneration: Secondary | ICD-10-CM

## 2013-12-12 ENCOUNTER — Encounter: Payer: Self-pay | Admitting: Nurse Practitioner

## 2013-12-22 ENCOUNTER — Encounter: Payer: Self-pay | Admitting: Nurse Practitioner

## 2013-12-22 ENCOUNTER — Ambulatory Visit (INDEPENDENT_AMBULATORY_CARE_PROVIDER_SITE_OTHER): Payer: Medicare Other | Admitting: Nurse Practitioner

## 2013-12-22 VITALS — BP 90/68 | HR 72

## 2013-12-22 DIAGNOSIS — R1310 Dysphagia, unspecified: Secondary | ICD-10-CM

## 2013-12-22 NOTE — Patient Instructions (Signed)
You have been scheduled for a Barium Esophogram at [facility] (1st floor of the hospital) on Tuesday 12-27-2013 at 10:00 am . Please arrive at 9:45 am  prior to your appointment for registration. Make certain not to have anything to eat or drink 3 hours prior to your test. If you need to reschedule for any reason, please contact radiology at [phone] to do so. __________________________________________________________________ A barium swallow is an examination that concentrates on views of the esophagus. This tends to be a double contrast exam (barium and two liquids which, when combined, create a gas to distend the wall of the oesophagus) or single contrast (non-ionic iodine based). The study is usually tailored to your symptoms so a good history is essential. Attention is paid during the study to the form, structure and configuration of the esophagus, looking for functional disorders (such as aspiration, dysphagia, achalasia, motility and reflux) EXAMINATION You may be asked to change into a gown, depending on the type of swallow being performed. A radiologist and radiographer will perform the procedure. The radiologist will advise you of the type of contrast selected for your procedure and direct you during the exam. You will be asked to stand, sit or lie in several different positions and to hold a small amount of fluid in your mouth before being asked to swallow while the imaging is performed .In some instances you may be asked to swallow barium coated marshmallows to assess the motility of a solid food bolus. The exam can be recorded as a digital or video fluoroscopy procedure. POST PROCEDURE It will take 1-2 days for the barium to pass through your system. To facilitate this, it is important, unless otherwise directed, to increase your fluids for the next 24-48hrs and to resume your normal diet.  This test typically takes about 30 minutes to  perform. __________________________________________________________________________________

## 2013-12-22 NOTE — Progress Notes (Signed)
HPI :  Patient is a 80 year old female, new to this practice, referred for evaluation of dysphasia. Patient is a resident of [name] where she has been followed by SLP. Patient has mild OP dysphagia without aspiration. She coughs with solids and liquids, feels like solids get stuck in esophagus. Appetite is great, denies weight loss. No history of GERD  Past Medical History  Diagnosis Date  . Abdominal aneurysm without mention of rupture   . Cholelithiasis   . Hyperlipidemia   . TIA (transient ischemic attack) ~ 09/2010    "[name]"  . Neuropathic pain   . Osteoporosis   . Arthritis   . Hypertension 04/30/11    "not a problem now"  . Pneumonia     "once or twice"  . Migraines   . Headache(784.0)   . Peripheral neuropathy     "hands, feet, legs"  . Stroke     hx of TIA  . Chronic kidney disease     NEPHRECTOMY    Family History  Problem Relation Age of Onset  . Diabetes Mother   . Heart disease Mother   . Hypertension Mother   . Hyperlipidemia Mother   . Aneurysm Father   . Diabetes Brother    History  Substance Use Topics  . Smoking status: Former Smoker -- 0.25 packs/day for 60 years    Types: Cigarettes    Start date: 06/17/2011  . Smokeless tobacco: Never Used  . Alcohol Use: No   Current Outpatient Prescriptions  Medication Sig Dispense Refill  . amLODipine (NORVASC) 2.5 MG tablet       . aspirin EC 325 MG tablet Take 325 mg by mouth every morning.      . bisacodyl (DULCOLAX) 10 MG suppository Place 10 mg rectally as needed for constipation.      . Cholecalciferol (VITAMIN D3) 50000 UNITS CAPS Take 1 capsule by mouth once a week.      Marland Kitchen. CREON 24000 UNITS CPEP Take 2 capsules by mouth 3 (three) times daily before meals.       . docusate sodium (COLACE) 100 MG capsule Take 100 mg by mouth 2 (two) times daily.      Marland Kitchen. escitalopram (LEXAPRO) 10 MG tablet Take 10 mg by mouth daily.      . fish oil-omega-3 fatty acids 1000 MG  capsule Take 2 g by mouth every morning.      . loratadine (CLARITIN) 10 MG tablet Take 10 mg by mouth daily.      . Multiple Vitamin (MULTIVITAMIN) tablet Take 1 tablet by mouth daily.      . multivitamin-lutein (OCUVITE-LUTEIN) CAPS capsule Take 1 capsule by mouth daily.      . polyethylene glycol powder (GLYCOLAX/MIRALAX) powder Take 1 Container by mouth once.      . pregabalin (LYRICA) 75 MG capsule Take 75 mg by mouth 2 (two) times daily.        Marland Kitchen. Propylene Glycol (SYSTANE BALANCE) 0.6 % SOLN Apply 3 drops to eye 3 (three) times daily.      . rosuvastatin (CRESTOR) 40 MG tablet Take 40 mg by mouth daily with supper.       . senna (SENOKOT) 8.6 MG tablet Take 1 tablet by mouth daily.       No current facility-administered medications for this visit.   Allergies  Allergen Reactions  . Codeine Rash  . Penicillins Rash     Review of Systems: All systems reviewed  and negative except where noted in HPI.   Physical Exam: BP 90/68  Pulse 72 Constitutional: Pleasant,well-developed, white female in no acute distress. In wheelchair HEENT: Normocephalic and atraumatic. Conjunctivae are normal. No scleral icterus. Neck supple.  Cardiovascular: Normal rate, regular rhythm.  Pulmonary/chest: Effort normal and breath sounds normal. Bibasilar inspiratory crackles.  Abdominal: limited exam, in wheelchair. Abdomen soft, nondistended, nontender. Bowel sounds active throughout. Extremities: no edema Lymphadenopathy: No cervical adenopathy noted. Neurological: Alert and oriented to person place and time. Skin: Skin is warm and dry. No rashes noted. Psychiatric: Normal mood and affect. Behavior is normal.   ASSESSMENT AND PLAN:  34. 80 year old female with solid food dyshagia and coughing with liquids. She has mild oropharyngeal dysphagia without aspiration by FEES study which probably explains coughing with meals. Needs evaluation of solid food dysphagia which could just be dysmotility.  Stricture not excluded. Will obtain a barium swallow with tablet to start with. Of note, CBC last month pertinent for mild eosinophilia. WBC, hgb normal. Renal function normal. TSH normal.   2. AAA, 5.0 cm on 2013 scan  3. ?pancreatic insuffiency. Creon on home med list.

## 2013-12-23 DIAGNOSIS — R1310 Dysphagia, unspecified: Secondary | ICD-10-CM | POA: Insufficient documentation

## 2013-12-25 NOTE — Progress Notes (Signed)
Agree with Ms. Guenther's assessment and plan. Iva Booparl E. Carletta Feasel, MD, [facility]  Await the Ba swallow.  If she has a stricture/tablet hangs in esophagus can [facility] when I am there later tjhis month  MAC or moderate - no prefence

## 2013-12-27 ENCOUNTER — Ambulatory Visit (HOSPITAL_COMMUNITY)
Admission: RE | Admit: 2013-12-27 | Discharge: 2013-12-27 | Disposition: A | Discharge status: Home / Self Care | Payer: Medicare Other | Source: Ambulatory Visit | Attending: Nurse Practitioner | Admitting: Nurse Practitioner

## 2013-12-27 DIAGNOSIS — R1310 Dysphagia, unspecified: Secondary | ICD-10-CM | POA: Insufficient documentation

## 2013-12-28 NOTE — Progress Notes (Signed)
Quick Note:  Please schedule for [facility], [facility] when I am working there ______

## 2013-12-29 ENCOUNTER — Other Ambulatory Visit: Payer: Self-pay

## 2013-12-29 DIAGNOSIS — R1319 Other dysphagia: Secondary | ICD-10-CM

## 2014-01-11 ENCOUNTER — Encounter (HOSPITAL_COMMUNITY)
Admission: RE | Disposition: A | Discharge status: Home / Self Care | Payer: Self-pay | Source: Ambulatory Visit | Attending: Internal Medicine

## 2014-01-11 ENCOUNTER — Encounter (HOSPITAL_COMMUNITY): Payer: Self-pay

## 2014-01-11 ENCOUNTER — Ambulatory Visit (HOSPITAL_COMMUNITY)
Admission: RE | Admit: 2014-01-11 | Discharge: 2014-01-11 | Disposition: A | Discharge status: Home / Self Care | Payer: Medicare Other | Source: Ambulatory Visit | Attending: Internal Medicine | Admitting: Internal Medicine

## 2014-01-11 DIAGNOSIS — K222 Esophageal obstruction: Secondary | ICD-10-CM | POA: Diagnosis not present

## 2014-01-11 DIAGNOSIS — K2970 Gastritis, unspecified, without bleeding: Secondary | ICD-10-CM | POA: Diagnosis not present

## 2014-01-11 DIAGNOSIS — R1310 Dysphagia, unspecified: Secondary | ICD-10-CM | POA: Insufficient documentation

## 2014-01-11 DIAGNOSIS — G43909 Migraine, unspecified, not intractable, without status migrainosus: Secondary | ICD-10-CM | POA: Insufficient documentation

## 2014-01-11 DIAGNOSIS — Z7982 Long term (current) use of aspirin: Secondary | ICD-10-CM | POA: Diagnosis not present

## 2014-01-11 DIAGNOSIS — E785 Hyperlipidemia, unspecified: Secondary | ICD-10-CM | POA: Insufficient documentation

## 2014-01-11 DIAGNOSIS — N189 Chronic kidney disease, unspecified: Secondary | ICD-10-CM | POA: Diagnosis not present

## 2014-01-11 DIAGNOSIS — R1319 Other dysphagia: Secondary | ICD-10-CM

## 2014-01-11 DIAGNOSIS — Z79899 Other long term (current) drug therapy: Secondary | ICD-10-CM | POA: Diagnosis not present

## 2014-01-11 DIAGNOSIS — K220 Achalasia of cardia: Secondary | ICD-10-CM | POA: Diagnosis present

## 2014-01-11 DIAGNOSIS — R1312 Dysphagia, oropharyngeal phase: Secondary | ICD-10-CM | POA: Insufficient documentation

## 2014-01-11 DIAGNOSIS — Z905 Acquired absence of kidney: Secondary | ICD-10-CM | POA: Insufficient documentation

## 2014-01-11 DIAGNOSIS — I129 Hypertensive chronic kidney disease with stage 1 through stage 4 chronic kidney disease, or unspecified chronic kidney disease: Secondary | ICD-10-CM | POA: Diagnosis not present

## 2014-01-11 DIAGNOSIS — Z87891 Personal history of nicotine dependence: Secondary | ICD-10-CM | POA: Diagnosis not present

## 2014-01-11 DIAGNOSIS — Z8673 Personal history of transient ischemic attack (TIA), and cerebral infarction without residual deficits: Secondary | ICD-10-CM | POA: Insufficient documentation

## 2014-01-11 DIAGNOSIS — K2990 Gastroduodenitis, unspecified, without bleeding: Secondary | ICD-10-CM

## 2014-01-11 HISTORY — DX: Achalasia of cardia: K22.0

## 2014-01-11 HISTORY — PX: BOTOX INJECTION: SHX5754

## 2014-01-11 HISTORY — PX: ESOPHAGOGASTRODUODENOSCOPY: SHX5428

## 2014-01-11 SURGERY — EGD (ESOPHAGOGASTRODUODENOSCOPY)
Anesthesia: Moderate Sedation

## 2014-01-11 MED ORDER — MIDAZOLAM HCL 10 MG/2ML IJ SOLN
INTRAMUSCULAR | Status: DC | PRN
  Administered 2014-01-11: 1 mg via INTRAVENOUS

## 2014-01-11 MED ORDER — SODIUM CHLORIDE 0.9 % IJ SOLN
INTRAMUSCULAR | Status: AC
  Filled 2014-01-11: qty 10

## 2014-01-11 MED ORDER — SODIUM CHLORIDE 0.9 % IV SOLN
INTRAVENOUS | Status: DC
  Administered 2014-01-11: 11:00:00 via INTRAVENOUS

## 2014-01-11 MED ORDER — FENTANYL CITRATE 0.05 MG/ML IJ SOLN
INTRAMUSCULAR | Status: AC
  Filled 2014-01-11: qty 2

## 2014-01-11 MED ORDER — BUTAMBEN-TETRACAINE-BENZOCAINE 2-2-14 % EX AERO
INHALATION_SPRAY | CUTANEOUS | Status: DC | PRN
  Administered 2014-01-11: 2 via TOPICAL

## 2014-01-11 MED ORDER — ONABOTULINUMTOXINA 100 UNITS IJ SOLR
100.0000 [IU] | Freq: Once | INTRAMUSCULAR | Status: AC
  Administered 2014-01-11: 100 [IU] via INTRAMUSCULAR
  Filled 2014-01-11: qty 100

## 2014-01-11 MED ORDER — MIDAZOLAM HCL 10 MG/2ML IJ SOLN
INTRAMUSCULAR | Status: AC
  Filled 2014-01-11: qty 2

## 2014-01-11 MED ORDER — FENTANYL CITRATE 0.05 MG/ML IJ SOLN
INTRAMUSCULAR | Status: DC | PRN
  Administered 2014-01-11: 12.5 ug via INTRAVENOUS

## 2014-01-11 NOTE — Interval H&P Note (Signed)
History and Physical Interval Note:  01/11/2014 10:10 AM  [name]  has presented today for surgery, with the diagnosis of dysphagia Ba swallow suggests achalasia. The various methods of treatment have been discussed with the patient and family. After consideration of risks, benefits and other options for treatment, the patient has consented to  Procedure(s): ESOPHAGOGASTRODUODENOSCOPY (EGD) (N/A) BOTOX INJECTION (N/A) as a surgical intervention .  The patient's history has been reviewed, patient examined, no change in status, stable for surgery.  I have reviewed the patient's chart and labs.  Questions were answered to the patient's satisfaction.     Stan Headarl Gessner

## 2014-01-11 NOTE — Op Note (Signed)
[facility] [address] [place], [id number]   ENDOSCOPY PROCEDURE REPORT  PATIENT: [name] BilisLoy, Shandrea A.  MR#: [id number] BIRTHDATE: 11/08/1933 , 80  yrs. old GENDER: Female ENDOSCOPIST: Iva Booparl E Tequan Redmon, MD, [facility] PROCEDURE DATE:  01/11/2014 PROCEDURE:  EGD w/ directed submucosal injection(s), any substance ASA CLASS:     Class III INDICATIONS:  Dysphagia.  Achalasia MEDICATIONS: Fentanyl 12.5 mcg IV and Versed 1mg  IV TOPICAL ANESTHETIC: Cetacaine Spray  DESCRIPTION OF PROCEDURE: After the risks benefits and alternatives of the procedure were thoroughly explained, informed consent was obtained.  The    endoscope was introduced through the mouth and advanced to the second portion of the duodenum. Without limitations.  The instrument was slowly withdrawn as the mucosa was fully examined.        ESOPHAGUS: A stenosis was found at the gastroesophageal junction. The stenosis was traversable with the endoscope.  A(n) 4 ml Botulinum toxin injection was given to deliver drugs.   100 U total, 4 x 25 U  STOMACH: Moderate gastritis (inflammation) was found in the gastric antrum.  The remainder of the upper endoscopy exam was otherwise normal. Retroflexed views revealed no abnormalities.     The scope was then withdrawn from the patient and the procedure completed.  COMPLICATIONS: There were no complications. ENDOSCOPIC IMPRESSION: 1.   Stenosis was found at the gastroesophageal junction; injection was given to deliver drugs.  botulinum toxin - suspect achalasia 2.   Gastritis (inflammation) was found in the gastric antrum 3.   The remainder of the upper endoscopy exam was otherwise normal  RECOMMENDATIONS: Call for office visit to be seen in 8 weeks    eSigned:  Iva Booparl E Bransyn Adami, MD, [facility] 01/11/2014 11:57 AM   CC:The Patient /[facility]

## 2014-01-11 NOTE — H&P (View-Only) (Signed)
HPI :  Patient is a 80 year old female, new to this practice, referred for evaluation of dysphasia. Patient is a resident of [name] where she has been followed by SLP. Patient has mild OP dysphagia without aspiration. She coughs with solids and liquids, feels like solids get stuck in esophagus. Appetite is great, denies weight loss. No history of GERD  Past Medical History  Diagnosis Date  . Abdominal aneurysm without mention of rupture   . Cholelithiasis   . Hyperlipidemia   . TIA (transient ischemic attack) ~ 09/2010    "[name]"  . Neuropathic pain   . Osteoporosis   . Arthritis   . Hypertension 04/30/11    "not a problem now"  . Pneumonia     "once or twice"  . Migraines   . Headache(784.0)   . Peripheral neuropathy     "hands, feet, legs"  . Stroke     hx of TIA  . Chronic kidney disease     NEPHRECTOMY    Family History  Problem Relation Age of Onset  . Diabetes Mother   . Heart disease Mother   . Hypertension Mother   . Hyperlipidemia Mother   . Aneurysm Father   . Diabetes Brother    History  Substance Use Topics  . Smoking status: Former Smoker -- 0.25 packs/day for 60 years    Types: Cigarettes    Start date: 06/17/2011  . Smokeless tobacco: Never Used  . Alcohol Use: No   Current Outpatient Prescriptions  Medication Sig Dispense Refill  . amLODipine (NORVASC) 2.5 MG tablet       . aspirin EC 325 MG tablet Take 325 mg by mouth every morning.      . bisacodyl (DULCOLAX) 10 MG suppository Place 10 mg rectally as needed for constipation.      . Cholecalciferol (VITAMIN D3) 50000 UNITS CAPS Take 1 capsule by mouth once a week.      Marland Kitchen. CREON 24000 UNITS CPEP Take 2 capsules by mouth 3 (three) times daily before meals.       . docusate sodium (COLACE) 100 MG capsule Take 100 mg by mouth 2 (two) times daily.      Marland Kitchen. escitalopram (LEXAPRO) 10 MG tablet Take 10 mg by mouth daily.      . fish oil-omega-3 fatty acids 1000 MG  capsule Take 2 g by mouth every morning.      . loratadine (CLARITIN) 10 MG tablet Take 10 mg by mouth daily.      . Multiple Vitamin (MULTIVITAMIN) tablet Take 1 tablet by mouth daily.      . multivitamin-lutein (OCUVITE-LUTEIN) CAPS capsule Take 1 capsule by mouth daily.      . polyethylene glycol powder (GLYCOLAX/MIRALAX) powder Take 1 Container by mouth once.      . pregabalin (LYRICA) 75 MG capsule Take 75 mg by mouth 2 (two) times daily.        Marland Kitchen. Propylene Glycol (SYSTANE BALANCE) 0.6 % SOLN Apply 3 drops to eye 3 (three) times daily.      . rosuvastatin (CRESTOR) 40 MG tablet Take 40 mg by mouth daily with supper.       . senna (SENOKOT) 8.6 MG tablet Take 1 tablet by mouth daily.       No current facility-administered medications for this visit.   Allergies  Allergen Reactions  . Codeine Rash  . Penicillins Rash     Review of Systems: All systems reviewed  and negative except where noted in HPI.   Physical Exam: BP 90/68  Pulse 72 Constitutional: Pleasant,well-developed, white female in no acute distress. In wheelchair HEENT: Normocephalic and atraumatic. Conjunctivae are normal. No scleral icterus. Neck supple.  Cardiovascular: Normal rate, regular rhythm.  Pulmonary/chest: Effort normal and breath sounds normal. Bibasilar inspiratory crackles.  Abdominal: limited exam, in wheelchair. Abdomen soft, nondistended, nontender. Bowel sounds active throughout. Extremities: no edema Lymphadenopathy: No cervical adenopathy noted. Neurological: Alert and oriented to person place and time. Skin: Skin is warm and dry. No rashes noted. Psychiatric: Normal mood and affect. Behavior is normal.   ASSESSMENT AND PLAN:  34. 80 year old female with solid food dyshagia and coughing with liquids. She has mild oropharyngeal dysphagia without aspiration by FEES study which probably explains coughing with meals. Needs evaluation of solid food dysphagia which could just be dysmotility.  Stricture not excluded. Will obtain a barium swallow with tablet to start with. Of note, CBC last month pertinent for mild eosinophilia. WBC, hgb normal. Renal function normal. TSH normal.   2. AAA, 5.0 cm on 2013 scan  3. ?pancreatic insuffiency. Creon on home med list.

## 2014-01-11 NOTE — Discharge Instructions (Addendum)
I injected medicine (BoTox) to try to help you swallow better.  You will need to see me in my office in September or October to see if this helped.  I appreciate the opportunity to care for you. Iva Booparl E. Josimar Corning, MD, FACG  YOU HAD AN ENDOSCOPIC PROCEDURE TODAY: Refer to the procedure report and other information in the discharge instructions given to you for any specific questions about what was found during the examination. If this information does not answer your questions, please call Dr. Marvell FullerGessner's office at [phone] to clarify.   YOU SHOULD EXPECT: Some feelings of bloating in the abdomen. Passage of more gas than usual. Walking can help get rid of the air that was put into your GI tract during the procedure and reduce the bloating. If you had a lower endoscopy (such as a colonoscopy or flexible sigmoidoscopy) you may notice spotting of blood in your stool or on the toilet paper. Some abdominal soreness may be present for a day or two, also.  DIET: Your first meal following the procedure should be a light meal and then it is ok to progress to your normal diet. A half-sandwich or bowl of soup is an example of a good first meal. Heavy or fried foods are harder to digest and may make you feel nauseous or bloated. Drink plenty of fluids but you should avoid alcoholic beverages for 24 hours.   ACTIVITY: Your care partner should take you home directly after the procedure. You should plan to take it easy, moving slowly for the rest of the day. You can resume normal activity the day after the procedure however YOU SHOULD NOT DRIVE, use power tools, machinery or perform tasks that involve climbing or major physical exertion for 24 hours (because of the sedation medicines used during the test).   SYMPTOMS TO REPORT IMMEDIATELY: A gastroenterologist can be reached at any hour. Please call [phone]  for any of the following symptoms:   Following upper endoscopy (EGD, EUS, ERCP, esophageal  dilation) Vomiting of blood or coffee ground material  New, significant abdominal pain  New, significant chest pain or pain under the shoulder blades  Painful or persistently difficult swallowing  New shortness of breath  Black, tarry-looking or red, bloody stools

## 2014-01-12 ENCOUNTER — Encounter (HOSPITAL_COMMUNITY): Payer: Self-pay | Admitting: Internal Medicine

## 2014-03-20 ENCOUNTER — Ambulatory Visit (INDEPENDENT_AMBULATORY_CARE_PROVIDER_SITE_OTHER): Payer: Medicare Other | Admitting: Internal Medicine

## 2014-03-20 ENCOUNTER — Encounter: Payer: Self-pay | Admitting: Internal Medicine

## 2014-03-20 VITALS — BP 142/62 | HR 60

## 2014-03-20 DIAGNOSIS — K220 Achalasia of cardia: Secondary | ICD-10-CM

## 2014-03-20 NOTE — Patient Instructions (Signed)
Glad to hear things are better with your swallowing. If you have problems again please call my office and let us know.  I appreciate the opportunity to care for you. Iva Booparl E. Jaeson Molstad, MD, [facility]

## 2014-03-20 NOTE — Progress Notes (Signed)
   Subjective:    Patient ID: Silvano Bilisarolyn A Ramanathan, female    DOB: 10/18/1933, 80 y.o.   MRN: [id number]  HPI The patient is here with staff from her SNF. She says she is not having any dysphagia after BoTox injection into LES in July. Medications, allergies, past medical history, past surgical history, family history and social history are reviewed and updated in the EMR. Review of Systems In a wheelchair    Objective:   Physical Exam  NAD     Assessment & Plan:  Achalasia -suspected by hx/ba swallow Doing well after Botox injection Will see her prn dysphagia    OV:FIEP,PIRJJOACZCc:SHAW,KIMBERLEE, MD

## 2014-03-20 NOTE — Assessment & Plan Note (Addendum)
Doing well after Botox injection Will see her prn dysphagia

## 2014-05-25 ENCOUNTER — Encounter (HOSPITAL_COMMUNITY): Payer: Self-pay | Admitting: Vascular Surgery

## 2014-07-27 ENCOUNTER — Emergency Department (HOSPITAL_COMMUNITY)
Admission: EM | Admit: 2014-07-27 | Discharge: 2014-07-27 | Disposition: A | Discharge status: Home / Self Care | Payer: Medicare Other | Attending: Emergency Medicine | Admitting: Emergency Medicine

## 2014-07-27 ENCOUNTER — Emergency Department (HOSPITAL_COMMUNITY): Payer: Medicare Other

## 2014-07-27 ENCOUNTER — Encounter (HOSPITAL_COMMUNITY): Payer: Self-pay | Admitting: Emergency Medicine

## 2014-07-27 DIAGNOSIS — R040 Epistaxis: Secondary | ICD-10-CM | POA: Insufficient documentation

## 2014-07-27 DIAGNOSIS — G43909 Migraine, unspecified, not intractable, without status migrainosus: Secondary | ICD-10-CM | POA: Insufficient documentation

## 2014-07-27 DIAGNOSIS — Z880 Allergy status to penicillin: Secondary | ICD-10-CM | POA: Diagnosis not present

## 2014-07-27 DIAGNOSIS — Z8701 Personal history of pneumonia (recurrent): Secondary | ICD-10-CM | POA: Diagnosis not present

## 2014-07-27 DIAGNOSIS — Z8673 Personal history of transient ischemic attack (TIA), and cerebral infarction without residual deficits: Secondary | ICD-10-CM | POA: Insufficient documentation

## 2014-07-27 DIAGNOSIS — Z87891 Personal history of nicotine dependence: Secondary | ICD-10-CM | POA: Insufficient documentation

## 2014-07-27 DIAGNOSIS — M1990 Unspecified osteoarthritis, unspecified site: Secondary | ICD-10-CM | POA: Diagnosis not present

## 2014-07-27 DIAGNOSIS — Z7982 Long term (current) use of aspirin: Secondary | ICD-10-CM | POA: Diagnosis not present

## 2014-07-27 DIAGNOSIS — Z79899 Other long term (current) drug therapy: Secondary | ICD-10-CM | POA: Insufficient documentation

## 2014-07-27 DIAGNOSIS — I129 Hypertensive chronic kidney disease with stage 1 through stage 4 chronic kidney disease, or unspecified chronic kidney disease: Secondary | ICD-10-CM | POA: Insufficient documentation

## 2014-07-27 DIAGNOSIS — N189 Chronic kidney disease, unspecified: Secondary | ICD-10-CM | POA: Diagnosis not present

## 2014-07-27 DIAGNOSIS — Z8719 Personal history of other diseases of the digestive system: Secondary | ICD-10-CM | POA: Insufficient documentation

## 2014-07-27 DIAGNOSIS — E875 Hyperkalemia: Secondary | ICD-10-CM | POA: Diagnosis not present

## 2014-07-27 DIAGNOSIS — R0902 Hypoxemia: Secondary | ICD-10-CM | POA: Insufficient documentation

## 2014-07-27 DIAGNOSIS — E785 Hyperlipidemia, unspecified: Secondary | ICD-10-CM | POA: Diagnosis not present

## 2014-07-27 LAB — CBC
HEMATOCRIT: 42.6 % (ref 36.0–46.0)
Hemoglobin: 13.3 g/dL (ref 12.0–15.0)
MCH: 28.8 pg (ref 26.0–34.0)
MCHC: 31.2 g/dL (ref 30.0–36.0)
MCV: 92.2 fL (ref 78.0–100.0)
PLATELETS: 166 10*3/uL (ref 150–400)
RBC: 4.62 MIL/uL (ref 3.87–5.11)
RDW: 15 % (ref 11.5–15.5)
WBC: 9 10*3/uL (ref 4.0–10.5)

## 2014-07-27 LAB — I-STAT CHEM 8, ED
BUN: 38 mg/dL — ABNORMAL HIGH (ref 6–23)
CHLORIDE: 103 mmol/L (ref 96–112)
Calcium, Ion: 1.18 mmol/L (ref 1.13–1.30)
Creatinine, Ser: 0.9 mg/dL (ref 0.50–1.10)
Glucose, Bld: 122 mg/dL — ABNORMAL HIGH (ref 70–99)
HEMATOCRIT: 42 % (ref 36.0–46.0)
Hemoglobin: 14.3 g/dL (ref 12.0–15.0)
POTASSIUM: 5.6 mmol/L — AB (ref 3.5–5.1)
SODIUM: 141 mmol/L (ref 135–145)
TCO2: 29 mmol/L (ref 0–100)

## 2014-07-27 LAB — POTASSIUM: Potassium: 5.6 mmol/L — ABNORMAL HIGH (ref 3.5–5.1)

## 2014-07-27 MED ORDER — CEFUROXIME AXETIL 250 MG PO TABS: 250.0000 mg | ORAL_TABLET | Freq: Two times a day (BID) | ORAL | Status: DC

## 2014-07-27 MED ORDER — SODIUM POLYSTYRENE SULFONATE 15 GM/60ML PO SUSP
15.0000 g | Freq: Once | ORAL | Status: AC
  Administered 2014-07-27: 15 g via ORAL
  Filled 2014-07-27: qty 60

## 2014-07-27 MED ORDER — PHENYLEPHRINE HCL 0.5 % NA SOLN
1.0000 [drp] | Freq: Once | NASAL | Status: AC
  Administered 2014-07-27: 1 [drp] via NASAL

## 2014-07-27 MED ORDER — LIDOCAINE HCL 2 % EX GEL
1.0000 "application " | Freq: Once | CUTANEOUS | Status: AC
  Administered 2014-07-27: 1 via TOPICAL
  Filled 2014-07-27: qty 20

## 2014-07-27 MED ORDER — SODIUM CHLORIDE 0.9 % IV BOLUS (SEPSIS)
500.0000 mL | Freq: Once | INTRAVENOUS | Status: AC
  Administered 2014-07-27: 500 mL via INTRAVENOUS

## 2014-07-27 MED ORDER — IPRATROPIUM-ALBUTEROL 0.5-2.5 (3) MG/3ML IN SOLN
3.0000 mL | Freq: Once | RESPIRATORY_TRACT | Status: AC
  Administered 2014-07-27: 3 mL via RESPIRATORY_TRACT
  Filled 2014-07-27: qty 3

## 2014-07-27 NOTE — Discharge Instructions (Signed)
Read the information below.  You may return to the Emergency Department at any time for worsening condition or any new symptoms that concern you.    Please follow up with the ENT listed above in 2 days.  If you are unable to see this doctor in two days (Saturday), please return to the Emergency Department to have the nasal packing removed.  If you develop fevers, uncontrolled bleeding, or difficulty swallowing or breathing, return to the ER immediately for a recheck.    Please follow closely with your primary care provider for a recheck in the next 1-2 days.  You will need to have your potassium rechecked.    We have discharged you back your nursing facility in accordance with your wishes and the wishes expressed on your advanced care plan.  Your oxygen level is low and your potassium level is high.  We have given you medications to lower your potassium level but please be aware that very high levels of potassium in the blood can cause heart problems.  If you change your mind and would like be reevaluated, you may return to the Emergency Department at any time.  If you develop chest pain or shortness of breath, fevers, or uncontrolled bleeding, please return immediately.    Nosebleed Nosebleeds can be caused by many conditions, including trauma, infections, polyps, foreign bodies, dry mucous membranes or climate, medicines, and air conditioning. Most nosebleeds occur in the front of the nose. Because of this location, most nosebleeds can be controlled by pinching the nostrils gently and continuously for at least 10 to 20 minutes. The long, continuous pressure allows enough time for the blood to clot. If pressure is released during that 10 to 20 minute time period, the process may have to be started again. The nosebleed may stop by itself or quit with pressure, or it may need concentrated heating (cautery) or pressure from packing. HOME CARE INSTRUCTIONS   If your nose was packed, try to maintain the pack  inside until your health care provider removes it. If a gauze pack was used and it starts to fall out, gently replace it or cut the end off. Do not cut if a balloon catheter was used to pack the nose. Otherwise, do not remove unless instructed.  Avoid blowing your nose for 12 hours after treatment. This could dislodge the pack or clot and start the bleeding again.  If the bleeding starts again, sit up and bend forward, gently pinching the front half of your nose continuously for 20 minutes.  If bleeding was caused by dry mucous membranes, use over-the-counter saline nasal spray or gel. This will keep the mucous membranes moist and allow them to heal. If you must use a lubricant, choose the water-soluble variety. Use it only sparingly and not within several hours of lying down.  Do not use petroleum jelly or mineral oil, as these may drip into the lungs and cause serious problems.  Maintain humidity in your home by using less air conditioning or by using a humidifier.  Do not use aspirin or medicines which make bleeding more likely. Your health care provider can give you recommendations on this.  Resume normal activities as you are able, but try to avoid straining, lifting, or bending at the waist for several days.  If the nosebleeds become recurrent and the cause is unknown, your health care provider may suggest laboratory tests. SEEK MEDICAL CARE IF: You have a fever. SEEK IMMEDIATE MEDICAL CARE IF:   Bleeding recurs  and cannot be controlled.  There is unusual bleeding from or bruising on other parts of the body.  Nosebleeds continue.  There is any worsening of the condition which originally brought you in.  You become light-headed, feel faint, become sweaty, or vomit blood. MAKE SURE YOU:   Understand these instructions.  Will watch your condition.  Will get help right away if you are not doing well or get worse. Document Released: 03/12/2005 Document Revised: 10/17/2013  Document Reviewed: 05/03/2009 [facility] Patient Information 2015 [place], [place]. This information is not intended to replace advice given to you by your health care provider. Make sure you discuss any questions you have with your health care provider.  Hyperkalemia Hyperkalemia is when you have too much potassium in your blood. This can be a life-threatening condition. Potassium is normally removed (excreted) from the body by the kidneys. CAUSES  The potassium level in your body can become too high for the following reasons:  You take in too much potassium. You can do this by:  Using salt substitutes. They contain large amounts of potassium.  Taking potassium supplements from your caregiver. The dose may be too high for you.  Eating foods or taking nutritional products with potassium.  You excrete too little potassium. This can happen if:  Your kidneys are not functioning properly. Kidney (renal) disease is a very common cause of hyperkalemia.  You are taking medicines that lower your excretion of potassium, such as certain diuretic medicines.  You have an adrenal gland disease called Addison's disease.  You have a urinary tract obstruction, such as kidney stones.  You are on treatment to mechanically clean your blood (dialysis) and you skip a treatment.  You release a high amount of potassium from your cells into your blood. You may have a condition that causes potassium to move from your cells to your bloodstream. This can happen with:  Injury to muscles or other tissues. Most potassium is stored in the muscles.  Severe burns or infections.  Acidic blood plasma (acidosis). Acidosis can result from many diseases, such as uncontrolled diabetes. SYMPTOMS  Usually, there are no symptoms unless the potassium is dangerously high or has risen very quickly. Symptoms may include:  Irregular or very slow heartbeat.  Feeling sick to your stomach (nauseous).  Tiredness  (fatigue).  Nerve problems such as tingling of the skin, numbness of the hands or feet, weakness, or paralysis. DIAGNOSIS  A simple blood test can measure the amount of potassium in your body. An electrocardiogram test of the heart can also help make the diagnosis. The heart may beat dangerously fast or slow down and stop beating with severe hyperkalemia.  TREATMENT  Treatment depends on how bad the condition is and on the underlying cause.  If the hyperkalemia is an emergency (causing heart problems or paralysis), many different medicines can be used alone or together to lower the potassium level briefly. This may include an insulin injection even if you are not diabetic. Emergency dialysis may be needed to remove potassium from the body.  If the hyperkalemia is less severe or dangerous, the underlying cause is treated. This can include taking medicines if needed. Your prescription medicines may be changed. You may also need to take a medicine to help your body get rid of potassium. You may need to eat a diet low in potassium. HOME CARE INSTRUCTIONS   Take medicines and supplements as directed by your caregiver.  Do not take any over-the-counter medicines, supplements, natural products, herbs,  or vitamins without reviewing them with your caregiver. Certain supplements and natural food products can have high amounts of potassium. Other products (such as ibuprofen) can damage weak kidneys and raise your potassium.  You may be asked to do repeat lab tests. Be sure to follow these directions.  If you have kidney disease, you may need to follow a low potassium diet. SEEK MEDICAL CARE IF:   You notice an irregular or very slow heartbeat.  You feel lightheaded.  You develop weakness that is unusual for you. SEEK IMMEDIATE MEDICAL CARE IF:   You have shortness of breath.  You have chest discomfort.  You pass out (faint). MAKE SURE YOU:   Understand these instructions.  Will watch your  condition.  Will get help right away if you are not doing well or get worse. Document Released: 05/23/2002 Document Revised: 08/25/2011 Document Reviewed: 09/07/2013 [facility] Patient Information 2015 [place], [place]. This information is not intended to replace advice given to you by your health care provider. Make sure you discuss any questions you have with your health care provider.  Hypoxemia Hypoxemia occurs when your blood does not contain enough oxygen. The body cannot work well when it does not have enough oxygen because every part of your body needs oxygen. Oxygen travels to all parts of the body through your blood. Hypoxemia can develop suddenly or can come on slowly. CAUSES Some common causes of hypoxemia include:  Long-term (chronic) lung diseases, such as chronic obstructive pulmonary disease (COPD) or interstitial lung disease.  Disorders that affect breathing at night, such as sleep apnea.  Fluid buildup in your lungs (pulmonary edema).  Lung infection (pneumonia).  Lung or throat cancer.  Abnormal blood flow that bypasses the lungs (shunt).  Certain diseasesthat affect nerves or muscles.  A collapsed lung (pneumothorax).  A blood clot in the lungs (pulmonary embolus).  Certain types of heart disease.  Slow or shallow breathing (hypoventilation).  Certain medicines.  High altitudes.  Toxic chemicals and gases. SIGNS AND SYMPTOMS Not everyone who has hypoxemia will develop symptoms. If the hypoxemia developed quickly, you will likely have symptoms such as shortness of breath. If the hypoxemia came on slowly over months or years, you may not notice any symptoms. Symptoms can include:  Shortness of breath (dyspnea).  Bluish color of the skin, lips, or nail beds.  Breathing that is fast, noisy, or shallow.  A fast heartbeat.  Feeling tired or sleepy.  Being confused or feeling anxious. DIAGNOSIS To determine if you have hypoxemia, your health care  provider may perform:  A physical exam.  Blood tests.  A pulse oximetry. A sensor will be put on your finger, toe, or earlobe to measure the percent of oxygen in your blood. TREATMENT You will likely be treated with oxygen therapy. Depending on the cause of your hypoxemia, you may need oxygen for a short time (weeks or months), or you may need it indefinitely. Your health care provider may also recommend other therapies to treat the underlying cause of your hypoxemia. HOME CARE INSTRUCTIONS  Only take over-the-counter or prescription medicines as directed by your health care provider.  Follow oxygen safety measures if you are on oxygen therapy. These may include:  Always having a backup supply of oxygen.  Not allowing anyone to smoke around oxygen.  Handling the oxygen tanks carefully and as instructed.  If you smoke, quit. Stay away from people who smoke.  Follow up with your health care provider as directed. SEEK MEDICAL CARE  IF:  You have any concerns about your oxygen therapy.  You still have trouble breathing.  You become short of breath when you exercise.  You are tired when you wake up.  You have a headache when you wake up. SEEK IMMEDIATE MEDICAL CARE IF:   Your breathing gets worse.  You have new shortness of breath with normal activity.  You have a bluish color of the skin, lips, or nail beds.  You have confusion or cloudy thinking.  You cough up dark mucus.  You have chest pain.  You have a fever. MAKE SURE YOU:  Understand these instructions.  Will watch your condition.  Will get help right away if you are not doing well or get worse. Document Released: 12/16/2010 Document Revised: 06/07/2013 Document Reviewed: 12/30/2012 [facility] Patient Information 2015 [place], [place]. This information is not intended to replace advice given to you by your health care provider. Make sure you discuss any questions you have with your health care provider.

## 2014-07-27 NOTE — ED Notes (Signed)
Called [facility] 

## 2014-07-27 NOTE — ED Notes (Signed)
ENT Cart at bedside. 

## 2014-07-27 NOTE — ED Provider Notes (Signed)
CSN: [id number]     Arrival date & time 07/27/14  1056 History   First MD Initiated Contact with Patient 07/27/14 1102     Chief Complaint  Patient presents with  . Epistaxis     (Consider location/radiation/quality/duration/timing/severity/associated sxs/prior Treatment) HPI   Patient with hx HTN, multiple medical problems, p/w uncontrolled epistaxis since 7:45am.  Pt states she has had daily nosebleeds for the past week, most lasting about 30 minutes and resolving.  They occur randomly.  States this one started while she was eating breakfast, is located on the left side.  Denies lightheadedness/dizziness, weakness, SOB, nasal congestion or sinus pressure.  Denies any other sources of bleeding.  Takes an 81mg  aspirin daily but denies other blood thinner usage.    Past Medical History  Diagnosis Date  . Abdominal aneurysm without mention of rupture   . Cholelithiasis   . Hyperlipidemia   . TIA (transient ischemic attack) ~ 09/2010    "[name]"  . Neuropathic pain   . Osteoporosis   . Arthritis   . Hypertension 04/30/11    "not a problem now"  . Pneumonia     "once or twice"  . Migraines   . Headache(784.0)   . Peripheral neuropathy     "hands, feet, legs"  . Stroke     hx of TIA  . Chronic kidney disease     NEPHRECTOMY  . Achalasia -suspected by hx/ba swallow 01/11/2014   Past Surgical History  Procedure Laterality Date  . Carotid endarterectomy  03/1999; 04/1999    bilateral (Dr. Edilia Boickson)  . Nephrectomy  1954    "double left kidney out"  . Tonsillectomy      "when I was little"  . Abdominal hysterectomy  1984  . Tubal ligation  1975  . Dilation and curettage of uterus      "I've had a bunch of those"  . Colon surgery  2002    "8 inches of my colon taken out"  . Fracture surgery  02/2008    "femur; put rod & screws in"  . Fracture surgery  03/2008    "replaced screws in femur"  . Fracture surgery  04/2008    "replaced hardware S/P  rebreak femur"  . Esophagogastroduodenoscopy N/A 01/11/2014    Procedure: ESOPHAGOGASTRODUODENOSCOPY (EGD);  Surgeon: Iva Booparl E Gessner, MD;  [facility];  Service: Endoscopy;  Laterality: N/A;  . Botox injection N/A 01/11/2014    Procedure: BOTOX INJECTION;  Surgeon: Iva Booparl E Gessner, MD;  Location: [facility] END[facility]COPY;  Service: Endoscopy;  Laterality: N/A;  . Carotid angiogram N/A 06/30/2011    Procedure: CAROTID ANGIOGRAM;  Surgeon: Chuck Hinthristopher S Dickson, MD;  [facility];  Service: Cardiovascular;  Laterality: N/A;   Family History  Problem Relation Age of Onset  . Diabetes Mother   . Heart disease Mother   . Hypertension Mother   . Hyperlipidemia Mother   . Aneurysm Father   . Diabetes Brother    History  Substance Use Topics  . Smoking status: Former Smoker -- 0.25 packs/day for 60 years    Types: Cigarettes    Start date: 06/17/2011  . Smokeless tobacco: Never Used  . Alcohol Use: No   OB History    No data available     Review of Systems  All other systems reviewed and are negative.     Allergies  Aminoglycosides; Erythromycin; Macrolides and ketolides; Vancomycin; Codeine; and Penicillins  Home Medications   Prior  to Admission medications   Medication Sig Start Date End Date Taking? Authorizing Provider  acetaminophen (TYLENOL) 650 MG suppository Place 650 mg rectally every 6 (six) hours as needed for fever.    Historical Provider, MD  amLODipine (NORVASC) 2.5 MG tablet  07/20/12   Historical Provider, MD  aspirin EC 325 MG tablet Take 325 mg by mouth every morning.    Historical Provider, MD  bisacodyl (DULCOLAX) 5 MG EC tablet Take 5 mg by mouth every evening.    Historical Provider, MD  Cholecalciferol (VITAMIN D3) 50000 UNITS CAPS Take 1 capsule by mouth once a week.    Historical Provider, MD  Cranberry 475 MG CAPS Take 1 capsule by mouth 2 (two) times daily.    Historical Provider, MD  CREON 24000 UNITS CPEP Take 2 capsules by mouth 3 (three) times  daily before meals.  05/26/11   Historical Provider, MD  escitalopram (LEXAPRO) 10 MG tablet Take 10 mg by mouth daily.    Historical Provider, MD  fish oil-omega-3 fatty acids 1000 MG capsule Take 2 g by mouth every morning.    Historical Provider, MD  HYDROcodone-acetaminophen (NORCO/VICODIN) 5-325 MG per tablet Take 1-2 tablets by mouth every 4 (four) hours as needed for moderate pain.    Historical Provider, MD  hydrOXYzine (ATARAX/VISTARIL) 25 MG tablet Take 25 mg by mouth every 6 (six) hours as needed for itching.    Historical Provider, MD  ipratropium-albuterol (DUONEB) 0.5-2.5 (3) MG/3ML SOLN Take 3 mLs by nebulization every 6 (six) hours as needed.    Historical Provider, MD  loratadine (CLARITIN) 10 MG tablet Take 10 mg by mouth daily.    Historical Provider, MD  Multiple Vitamin (MULTIVITAMIN) tablet Take 1 tablet by mouth daily.    Historical Provider, MD  multivitamin-lutein (OCUVITE-LUTEIN) CAPS capsule Take 1 capsule by mouth daily.    Historical Provider, MD  polyethylene glycol powder (GLYCOLAX/MIRALAX) powder Take 1 Container by mouth once.    Historical Provider, MD  pregabalin (LYRICA) 75 MG capsule Take 75 mg by mouth 2 (two) times daily.      Historical Provider, MD  promethazine (PHENERGAN) 25 MG suppository Place 25 mg rectally every 8 (eight) hours as needed for nausea or vomiting.    Historical Provider, MD  Propylene Glycol (SYSTANE BALANCE) 0.6 % SOLN Apply 3 drops to eye 3 (three) times daily.    Historical Provider, MD  rosuvastatin (CRESTOR) 40 MG tablet Take 5 mg by mouth daily with supper.     Historical Provider, MD  senna (SENOKOT) 8.6 MG tablet Take 1 tablet by mouth daily.    Historical Provider, MD   BP 91/70 mmHg  Pulse 99  Temp(Src) 98.3 F (36.8 C) (Oral)  Resp 23  SpO2 91% Physical Exam  Constitutional: She appears well-developed and well-nourished. No distress.  HENT:  Head: Normocephalic and atraumatic.  Nose: Epistaxis is observed.  Neck: Neck  supple.  Pulmonary/Chest: Effort normal.  Neurological: She is alert.  Skin: She is not diaphoretic.  Nursing note and vitals reviewed.   ED Course  EPISTAXIS MANAGEMENT Date/Time: 07/27/2014 11:29 AM Performed by: Trixie Dredge Authorized by: Trixie Dredge Consent: Verbal consent obtained. Consent given by: patient Patient understanding: patient states understanding of the procedure being performed Patient identity confirmed: verbally with patient Local anesthetic: lidocaine 1% without epinephrine Patient sedated: no Treatment site: left anterior Repair method: silver nitrate and anterior pack Post-procedure assessment: bleeding decreased Treatment complexity: complex Patient tolerance: Patient tolerated the procedure well with no  immediate complications   (including critical care time) Labs Review Labs Reviewed - No data to display  Imaging Review No results found.   EKG Interpretation None       1:28 PM Discussed pt with Dr Donnald Garre, who will also see the patient.    Epistaxis not controlled by my initial attempts and bleeding restarted.  Dr Donnald Garre saw the patient and performed procedure with balloon rhino rocket, which did control the bleeding.  Please see her note for further information.    Patient's advanced care plan that she brought to the ED with her paperwork notes that she is a DNR and that she does not want to be admitted to the hospital "if at all possible."  She has outlined specifically that she does not want to be admitted to the hospital for rupture of her AAA, for MI, or for CVA.  Given this information, I have discussed the plan with Dr Donnald Garre who agrees that patient would very unlikely want hospitalization for hypoxia and epistaxis.  Her epistaxis is controlled and she denies any SOB despite occasional hypoxia.  Plan to d/c home.  Potassium recheck pending.   Potasium slightly elevated.  There are no concerning EKG findings.  Pt given medications to treat  hyperkalemia.  As before, pt will be d/c home in accordance with her own wishes expressed to me in the ED and by her advanced care plan.  The epistaxis is resolved.  There is no bleeding posteriorly and no e/o blood in her pharynx with packing in place.    MDM   Final diagnoses:  Oxygen desaturation  Epistaxis  Hypoxia  Hyperkalemia    Afebrile nontoxic patient presenting with left sided nosebleed.  Pt was not in afib during her visit despite EMS report.  The bleeding was controlled after multiple attempts in the ED, Dr Donnald Garre was able to stop bleeding.  Pt was intermittently agitated with what seemed to be muscle spasms in her legs and was bothered by many things including blood draw, light in her eyes, any attempts to stop her nosebleed.  She did desaturate while in the ED repeatedly but always denied any SOB.  She did seem to desaturate more when she was upset or in pain and was holding her breath somewhat.  She was found to be mildly hyperkalemia, treated in ED.  Please see discussion above regarding admission.   We initially were going to admit this patient due to hypoxia, hyperkalemia, agitation with concern she may pull out rhino rocket (again) (please see Dr Fabian Sharp note, the initial rhino rocket she placed was ripped out by the patient while she was attempting to blow up the balloon).  Pt discharged back to her facility per her wishes.  To return to ED in two days (or see ENT if available on Saturday) for removal of nasal packing.  Pt placed on antibiotic per my discussion with Dr Donnald Garre, as patient has many comorbidities.  D/C back to facility with ceftin.  Close PCP, ENT follow up.  Return to ED for worsening symptoms or if she changes her mind about admission.      Trixie Dredge, PA-C 07/27/14 1634  Medical screening examination/treatment/procedure(s) were conducted as a shared visit with non-physician practitioner(s) and myself.  I personally evaluated the patient during the  encounter.   EKG Interpretation   Date/Time:  Thursday July 27 2014 11:14:53 EST Ventricular Rate:  95 PR Interval:  174 QRS Duration: 137 QT Interval:  428 QTC Calculation: 538  R Axis:   12 Text Interpretation:  Sinus tachycardia Atrial premature complexes  Consider right atrial enlargement Left bundle branch block agree. no from  old. Confirmed by Donnald Garre, MD, Lebron Conners [phone]) on 07/27/2014 11:20:06 AM       PROCEDURE: NASAL BALLOON TAMPONADE DEVICE The patient was initially managed for nosebleed by Trixie Dredge. Despite placement of Merocel, the patient continued to have bleeding around it. I subsequently placed a rapid Rhino balloon epistaxis device. Initial placement was of a 7.5 cm balloon. This was placed after complete clearing of clots by suction and blowing to open nasal passage. The catheter placed easily however the patient was very agitated by it and as well as inflating the balloon quickly reached up and pulled it out. I subsequently placed a 4.5cm as I felt the patient might tolerate this better. Due to dementia she was very agitated by anything noxious such as blood pressure monitoring, IV draws and placement of the epistaxis catheter. She tolerated this 4.5 which I was able to inflate to about 7 mL. She did not like the balloon in place however it did control bleeding and she did not remove it. As per review of her advanced directives the patient was returned to nursing home care.   Arby Barrette, MD 08/03/14 [phone]

## 2014-07-27 NOTE — ED Notes (Signed)
EMS called out for Nosebleed off and on for past few days. Today severe nosebleed has not stopped for over last hour and a half. Pt is not on blood thinner. EMS noticed pt rhythm was Afib with BBB. Pt denies history of this. HR 80's. BP 160/80.

## 2014-07-27 NOTE — ED Notes (Signed)
[facility] called  

## 2014-08-30 ENCOUNTER — Encounter (HOSPITAL_COMMUNITY): Payer: Self-pay | Admitting: Emergency Medicine

## 2014-08-30 ENCOUNTER — Inpatient Hospital Stay (HOSPITAL_COMMUNITY)
Admission: EM | Admit: 2014-08-30 | Discharge: 2014-09-02 | DRG: 812 | Disposition: A | Discharge status: Skilled Nursing Facility | Payer: Medicare Other | Attending: Internal Medicine | Admitting: Internal Medicine

## 2014-08-30 DIAGNOSIS — D649 Anemia, unspecified: Secondary | ICD-10-CM

## 2014-08-30 DIAGNOSIS — D62 Acute posthemorrhagic anemia: Secondary | ICD-10-CM | POA: Diagnosis not present

## 2014-08-30 DIAGNOSIS — Z905 Acquired absence of kidney: Secondary | ICD-10-CM | POA: Diagnosis present

## 2014-08-30 DIAGNOSIS — I714 Abdominal aortic aneurysm, without rupture: Secondary | ICD-10-CM | POA: Diagnosis present

## 2014-08-30 DIAGNOSIS — E876 Hypokalemia: Secondary | ICD-10-CM | POA: Diagnosis present

## 2014-08-30 DIAGNOSIS — F0390 Unspecified dementia without behavioral disturbance: Secondary | ICD-10-CM | POA: Diagnosis present

## 2014-08-30 DIAGNOSIS — I10 Essential (primary) hypertension: Secondary | ICD-10-CM | POA: Diagnosis present

## 2014-08-30 DIAGNOSIS — M810 Age-related osteoporosis without current pathological fracture: Secondary | ICD-10-CM | POA: Diagnosis present

## 2014-08-30 DIAGNOSIS — Z79899 Other long term (current) drug therapy: Secondary | ICD-10-CM

## 2014-08-30 DIAGNOSIS — R531 Weakness: Secondary | ICD-10-CM | POA: Diagnosis not present

## 2014-08-30 DIAGNOSIS — M1990 Unspecified osteoarthritis, unspecified site: Secondary | ICD-10-CM | POA: Diagnosis present

## 2014-08-30 DIAGNOSIS — E785 Hyperlipidemia, unspecified: Secondary | ICD-10-CM | POA: Diagnosis present

## 2014-08-30 DIAGNOSIS — K922 Gastrointestinal hemorrhage, unspecified: Secondary | ICD-10-CM | POA: Diagnosis present

## 2014-08-30 DIAGNOSIS — Z6827 Body mass index (BMI) 27.0-27.9, adult: Secondary | ICD-10-CM

## 2014-08-30 DIAGNOSIS — Z87891 Personal history of nicotine dependence: Secondary | ICD-10-CM

## 2014-08-30 DIAGNOSIS — Z7982 Long term (current) use of aspirin: Secondary | ICD-10-CM

## 2014-08-30 DIAGNOSIS — E440 Moderate protein-calorie malnutrition: Secondary | ICD-10-CM | POA: Diagnosis present

## 2014-08-30 DIAGNOSIS — N179 Acute kidney failure, unspecified: Secondary | ICD-10-CM | POA: Diagnosis present

## 2014-08-30 DIAGNOSIS — Z8673 Personal history of transient ischemic attack (TIA), and cerebral infarction without residual deficits: Secondary | ICD-10-CM

## 2014-08-30 DIAGNOSIS — G629 Polyneuropathy, unspecified: Secondary | ICD-10-CM | POA: Diagnosis present

## 2014-08-30 LAB — COMPREHENSIVE METABOLIC PANEL
ALBUMIN: 2.8 g/dL — AB (ref 3.5–5.2)
ALK PHOS: 84 U/L (ref 39–117)
ALT: 13 U/L (ref 0–35)
AST: 21 U/L (ref 0–37)
Anion gap: 7 (ref 5–15)
BUN: 17 mg/dL (ref 6–23)
CO2: 29 mmol/L (ref 19–32)
Calcium: 8.6 mg/dL (ref 8.4–10.5)
Chloride: 104 mmol/L (ref 96–112)
Creatinine, Ser: 1.16 mg/dL — ABNORMAL HIGH (ref 0.50–1.10)
GFR, EST AFRICAN AMERICAN: 51 mL/min — AB (ref >90)
GFR, EST NON AFRICAN AMERICAN: 44 mL/min — AB (ref >90)
Glucose, Bld: 79 mg/dL (ref 70–99)
POTASSIUM: 3.9 mmol/L (ref 3.5–5.1)
SODIUM: 140 mmol/L (ref 135–145)
Total Bilirubin: 0.5 mg/dL (ref 0.3–1.2)
Total Protein: 5.4 g/dL — ABNORMAL LOW (ref 6.0–8.3)

## 2014-08-30 LAB — PROTIME-INR
INR: 1.05 (ref 0.00–1.49)
PROTHROMBIN TIME: 13.8 s (ref 11.6–15.2)

## 2014-08-30 LAB — POC OCCULT BLOOD, ED: FECAL OCCULT BLD: POSITIVE — AB

## 2014-08-30 LAB — CBC
HCT: 21.6 % — ABNORMAL LOW (ref 36.0–46.0)
Hemoglobin: 6.7 g/dL — CL (ref 12.0–15.0)
MCH: 27.9 pg (ref 26.0–34.0)
MCHC: 31 g/dL (ref 30.0–36.0)
MCV: 90 fL (ref 78.0–100.0)
PLATELETS: 263 10*3/uL (ref 150–400)
RBC: 2.4 MIL/uL — AB (ref 3.87–5.11)
RDW: 15.5 % (ref 11.5–15.5)
WBC: 6.2 10*3/uL (ref 4.0–10.5)

## 2014-08-30 NOTE — ED Provider Notes (Signed)
CSN: [id number]     Arrival date & time 08/30/14  2038 History   First MD Initiated Contact with Patient 08/30/14 2041     Chief Complaint  Patient presents with  . Abnormal Lab     (Consider location/radiation/quality/duration/timing/severity/associated sxs/prior Treatment) HPI  Pt presents from SNF with report of low hemoglobin of 6.7.  This is a drop from 12 in 12/15.  Pt reports feeling more tired than usual and "run down" .  She denies rectal bleding or melena.  Denies abdominal pain.  She is unsure how long her symptoms have been ongoing.  No syncope.  She is not on nsaids.  There are no other associated systemic symptoms, there are no other alleviating or modifying factors.   Past Medical History  Diagnosis Date  . Abdominal aneurysm without mention of rupture   . Cholelithiasis   . Hyperlipidemia   . TIA (transient ischemic attack) ~ 09/2010    "[name]"  . Neuropathic pain   . Osteoporosis   . Arthritis   . Hypertension 04/30/11    "not a problem now"  . Pneumonia     "once or twice"  . Migraines   . Headache(784.0)   . Peripheral neuropathy     "hands, feet, legs"  . Stroke     hx of TIA  . Chronic kidney disease     NEPHRECTOMY  . Achalasia -suspected by hx/ba swallow 01/11/2014   Past Surgical History  Procedure Laterality Date  . Carotid endarterectomy  03/1999; 04/1999    bilateral (Dr. Edilia Bo)  . Nephrectomy  1954    "double left kidney out"  . Tonsillectomy      "when I was little"  . Abdominal hysterectomy  1984  . Tubal ligation  1975  . Dilation and curettage of uterus      "I've had a bunch of those"  . Colon surgery  2002    "8 inches of my colon taken out"  . Fracture surgery  02/2008    "femur; put rod & screws in"  . Fracture surgery  03/2008    "replaced screws in femur"  . Fracture surgery  04/2008    "replaced hardware S/P rebreak femur"  . Esophagogastroduodenoscopy N/A 01/11/2014    Procedure:  ESOPHAGOGASTRODUODENOSCOPY (EGD);  Surgeon: Iva Boop, MD;  Location: [facility];  Service: Endoscopy;  Laterality: N/A;  . Botox injection N/A 01/11/2014    Procedure: BOTOX INJECTION;  Surgeon: Iva Boop, MD;  Location: [facility];  Service: Endoscopy;  Laterality: N/A;  . Carotid angiogram N/A 06/30/2011    Procedure: CAROTID ANGIOGRAM;  Surgeon: Chuck Hint, MD;  Location: [facility] CATH LAB;  Service: Cardiovascular;  Laterality: N/A;   Family History  Problem Relation Age of Onset  . Diabetes Mother   . Heart disease Mother   . Hypertension Mother   . Hyperlipidemia Mother   . Aneurysm Father   . Diabetes Brother    History  Substance Use Topics  . Smoking status: Former Smoker -- 0.25 packs/day for 60 years    Types: Cigarettes    Start date: 06/17/2011  . Smokeless tobacco: Never Used  . Alcohol Use: No   OB History    No data available     Review of Systems  ROS reviewed and all otherwise negative except for mentioned in HPI    Allergies  Aminoglycosides; Codeine; Erythromycin; Macrolides and ketolides; Penicillins; and Vancomycin  Home Medications  Prior to Admission medications   Medication Sig Start Date End Date Taking? Authorizing Provider  amLODipine (NORVASC) 2.5 MG tablet Take 2.5 mg by mouth daily.  07/20/12  Yes Historical Provider, MD  aspirin EC 325 MG tablet Take 325 mg by mouth every morning.   Yes Historical Provider, MD  bisacodyl (DULCOLAX) 5 MG EC tablet Take 5 mg by mouth every evening.   Yes Historical Provider, MD  Cranberry 475 MG CAPS Take 1 capsule by mouth 2 (two) times daily.   Yes Historical Provider, MD  CREON 24000 UNITS CPEP Take 2 capsules by mouth 3 (three) times daily before meals.  05/26/11  Yes Historical Provider, MD  escitalopram (LEXAPRO) 10 MG tablet Take 10 mg by mouth daily.   Yes Historical Provider, MD  fish oil-omega-3 fatty acids 1000 MG capsule Take 2 g by mouth every morning.   Yes Historical Provider,  MD  HYDROcodone-acetaminophen (NORCO/VICODIN) 5-325 MG per tablet Take 1 tablet by mouth every 6 (six) hours as needed for moderate pain.   Yes Historical Provider, MD  hydrOXYzine (ATARAX/VISTARIL) 25 MG tablet Take 25 mg by mouth 3 (three) times daily as needed for itching.   Yes Historical Provider, MD  ipratropium-albuterol (DUONEB) 0.5-2.5 (3) MG/3ML SOLN Take 3 mLs by nebulization every 6 (six) hours as needed (for shortness of breath).    Yes Historical Provider, MD  Multiple Vitamin (MULTIVITAMIN) tablet Take 1 tablet by mouth daily.   Yes Historical Provider, MD  multivitamin-lutein (OCUVITE-LUTEIN) CAPS capsule Take 1 capsule by mouth daily.   Yes Historical Provider, MD  polyethylene glycol (MIRALAX / GLYCOLAX) packet Take 17 g by mouth every other day.    Yes Historical Provider, MD  pregabalin (LYRICA) 75 MG capsule Take 75 mg by mouth 3 (three) times daily.    Yes Historical Provider, MD  promethazine (PHENERGAN) 25 MG suppository Place 25 mg rectally every 8 (eight) hours as needed for nausea or vomiting.   Yes Historical Provider, MD  Propylene Glycol (SYSTANE BALANCE) 0.6 % SOLN Apply 2 drops to eye 3 (three) times daily.    Yes Historical Provider, MD  rosuvastatin (CRESTOR) 5 MG tablet Take 5 mg by mouth at bedtime.   Yes Historical Provider, MD  senna (SENOKOT) 8.6 MG tablet Take 1 tablet by mouth daily.   Yes Historical Provider, MD  sodium chloride (OCEAN) 0.65 % SOLN nasal spray Place 1 spray into both nostrils 4 (four) times daily.   Yes Historical Provider, MD  Vitamin D, Ergocalciferol, (DRISDOL) 50000 UNITS CAPS capsule Take 50,000 Units by mouth every 30 (thirty) days.   Yes Historical Provider, MD  cefUROXime (CEFTIN) 250 MG tablet Take 1 tablet (250 mg total) by mouth 2 (two) times daily with a meal. 07/27/14   Trixie Dredge, PA-C  omeprazole (PRILOSEC) 20 MG capsule Take 20 mg by mouth daily.    Historical Provider, MD   BP 138/64 mmHg  Pulse 84  Temp(Src) 97.6 F (36.4  C) (Oral)  Resp 20  Ht 5\' 6"  (1.676 m)  Wt 170 lb (77.111 kg)  BMI 27.45 kg/m2  SpO2 100%  Vitals reviewed Physical Exam  Physical Examination: General appearance - alert, well appearing, and in no distress Mental status - alert, oriented to person, place, and time Eyes -no conjunctival injection ,no scleral icterus, some conjunctival pallor Mouth - mucous membranes moist, pharynx normal without lesions Chest - clear to auscultation, no wheezes, rales or rhonchi, symmetric air entry Heart - normal rate, regular rhythm,  normal S1, S2, no murmurs, rubs, clicks or gallops Abdomen - soft, nontender, nondistended, no masses or organomegaly Extremities - peripheral pulses normal, no pedal edema, no clubbing or cyanosis Skin - normal coloration and turgor, no rashes  ED Course  Procedures (including critical care time)  11:28 PM d/w Dr. Alvester Morin, [facility], he will come see patient in the ED for admission.   Labs Review Labs Reviewed  CBC - Abnormal; Notable for the following:    RBC 2.40 (*)    Hemoglobin 6.7 (*)    HCT 21.6 (*)    All other components within normal limits  COMPREHENSIVE METABOLIC PANEL - Abnormal; Notable for the following:    Creatinine, Ser 1.16 (*)    Total Protein 5.4 (*)    Albumin 2.8 (*)    GFR calc non Af Amer 44 (*)    GFR calc Af Amer 51 (*)    All other components within normal limits  COMPREHENSIVE METABOLIC PANEL - Abnormal; Notable for the following:    Total Protein 5.3 (*)    Albumin 2.8 (*)    GFR calc non Af Amer 60 (*)    GFR calc Af Amer 69 (*)    All other components within normal limits  CBC WITH DIFFERENTIAL/PLATELET - Abnormal; Notable for the following:    RBC 2.28 (*)    Hemoglobin 6.2 (*)    HCT 20.7 (*)    RDW 15.6 (*)    Neutrophils Relative % 36 (*)    Eosinophils Relative 11 (*)    All other components within normal limits  CBC WITH DIFFERENTIAL/PLATELET - Abnormal; Notable for the following:    RBC 3.20 (*)    Hemoglobin 9.3  (*)    HCT 29.3 (*)    Eosinophils Relative 10 (*)    All other components within normal limits  CBC - Abnormal; Notable for the following:    RBC 3.09 (*)    Hemoglobin 8.9 (*)    HCT 27.6 (*)    All other components within normal limits  BASIC METABOLIC PANEL - Abnormal; Notable for the following:    Potassium 3.4 (*)    GFR calc non Af Amer 52 (*)    GFR calc Af Amer 60 (*)    All other components within normal limits  RETICULOCYTES - Abnormal; Notable for the following:    Retic Ct Pct 3.9 (*)    RBC. 3.22 (*)    All other components within normal limits  POC OCCULT BLOOD, ED - Abnormal; Notable for the following:    Fecal Occult Bld POSITIVE (*)    All other components within normal limits  PROTIME-INR  VITAMIN B12  FOLATE  IRON AND TIBC  FERRITIN  TYPE AND SCREEN  PREPARE RBC (CROSSMATCH)    Imaging Review No results found.   EKG Interpretation   Date/Time:  Wednesday August 30 2014 20:46:10 EDT Ventricular Rate:  83 PR Interval:  191 QRS Duration: 143 QT Interval:  435 QTC Calculation: 511 R Axis:   34 Text Interpretation:  Sinus rhythm Atrial premature complexes Left bundle  branch block Baseline wander in lead(s) V2 No significant change since  last tracing Confirmed by [facility]  MD, Alexarae Oliva [id number]) on 08/30/2014 9:39:19  PM      MDM   Final diagnoses:  None  lower gi bleed anemia  Pt presenting with anemia, this was confirmed on blood test in the ED.  hemocult is positive.  Will need PRBC tranfusion and admission  to medicine for further evaluation and management.  No bright red bleeding or abdominal tenderness.      Jerelyn ScottMartha Linker, MD 09/01/14 [phone]

## 2014-08-30 NOTE — ED Notes (Signed)
MD Alvester MorinNewton at bedside.

## 2014-08-30 NOTE — ED Notes (Signed)
EDP notified of hgb-6.7

## 2014-08-30 NOTE — ED Notes (Signed)
EMS reports pt sent to ED for eval d/t hgb-6.7. Pt asympyomatic, a/o x 4 on arrival and denies any pain. Pt hgb-12.7 07/28/14 per facility paperwork.

## 2014-08-31 DIAGNOSIS — D559 Anemia due to enzyme disorder, unspecified: Secondary | ICD-10-CM | POA: Diagnosis not present

## 2014-08-31 DIAGNOSIS — D649 Anemia, unspecified: Secondary | ICD-10-CM | POA: Diagnosis present

## 2014-08-31 LAB — CBC WITH DIFFERENTIAL/PLATELET
Basophils Absolute: 0 10*3/uL (ref 0.0–0.1)
Basophils Absolute: 0 10*3/uL (ref 0.0–0.1)
Basophils Relative: 0 % (ref 0–1)
Basophils Relative: 0 % (ref 0–1)
EOS ABS: 0.6 10*3/uL (ref 0.0–0.7)
EOS PCT: 11 % — AB (ref 0–5)
Eosinophils Absolute: 0.6 10*3/uL (ref 0.0–0.7)
Eosinophils Relative: 10 % — ABNORMAL HIGH (ref 0–5)
HCT: 29.3 % — ABNORMAL LOW (ref 36.0–46.0)
HEMATOCRIT: 20.7 % — AB (ref 36.0–46.0)
HEMOGLOBIN: 6.2 g/dL — AB (ref 12.0–15.0)
Hemoglobin: 9.3 g/dL — ABNORMAL LOW (ref 12.0–15.0)
LYMPHS ABS: 2.2 10*3/uL (ref 0.7–4.0)
LYMPHS PCT: 43 % (ref 12–46)
Lymphocytes Relative: 31 % (ref 12–46)
Lymphs Abs: 1.9 10*3/uL (ref 0.7–4.0)
MCH: 27.2 pg (ref 26.0–34.0)
MCH: 29.1 pg (ref 26.0–34.0)
MCHC: 30 g/dL (ref 30.0–36.0)
MCHC: 31.7 g/dL (ref 30.0–36.0)
MCV: 90.8 fL (ref 78.0–100.0)
MCV: 91.6 fL (ref 78.0–100.0)
MONO ABS: 0.5 10*3/uL (ref 0.1–1.0)
Monocytes Absolute: 0.7 10*3/uL (ref 0.1–1.0)
Monocytes Relative: 9 % (ref 3–12)
Monocytes Relative: 11 % (ref 3–12)
NEUTROS ABS: 3 10*3/uL (ref 1.7–7.7)
NEUTROS PCT: 48 % (ref 43–77)
Neutro Abs: 1.9 10*3/uL (ref 1.7–7.7)
Neutrophils Relative %: 36 % — ABNORMAL LOW (ref 43–77)
PLATELETS: 242 10*3/uL (ref 150–400)
PLATELETS: 239 10*3/uL (ref 150–400)
RBC: 2.28 MIL/uL — ABNORMAL LOW (ref 3.87–5.11)
RBC: 3.2 MIL/uL — ABNORMAL LOW (ref 3.87–5.11)
RDW: 15.6 % — ABNORMAL HIGH (ref 11.5–15.5)
RDW: 14.8 % (ref 11.5–15.5)
WBC: 5.2 10*3/uL (ref 4.0–10.5)
WBC: 6.2 10*3/uL (ref 4.0–10.5)

## 2014-08-31 LAB — COMPREHENSIVE METABOLIC PANEL
ALBUMIN: 2.8 g/dL — AB (ref 3.5–5.2)
ALT: 11 U/L (ref 0–35)
ANION GAP: 8 (ref 5–15)
AST: 19 U/L (ref 0–37)
Alkaline Phosphatase: 85 U/L (ref 39–117)
BILIRUBIN TOTAL: 1 mg/dL (ref 0.3–1.2)
BUN: 12 mg/dL (ref 6–23)
CO2: 26 mmol/L (ref 19–32)
Calcium: 8.5 mg/dL (ref 8.4–10.5)
Chloride: 107 mmol/L (ref 96–112)
Creatinine, Ser: 0.9 mg/dL (ref 0.50–1.10)
GFR calc Af Amer: 69 mL/min — ABNORMAL LOW (ref >90)
GFR, EST NON AFRICAN AMERICAN: 60 mL/min — AB (ref >90)
Glucose, Bld: 87 mg/dL (ref 70–99)
POTASSIUM: 3.8 mmol/L (ref 3.5–5.1)
Sodium: 141 mmol/L (ref 135–145)
Total Protein: 5.3 g/dL — ABNORMAL LOW (ref 6.0–8.3)

## 2014-08-31 LAB — PREPARE RBC (CROSSMATCH)

## 2014-08-31 MED ORDER — IPRATROPIUM-ALBUTEROL 0.5-2.5 (3) MG/3ML IN SOLN: 3.0000 mL | Freq: Four times a day (QID) | RESPIRATORY_TRACT | Status: DC | PRN

## 2014-08-31 MED ORDER — SODIUM CHLORIDE 0.9 % IV SOLN
Freq: Once | INTRAVENOUS | Status: AC
  Administered 2014-08-31: 02:00:00 via INTRAVENOUS

## 2014-08-31 MED ORDER — PANTOPRAZOLE SODIUM 40 MG PO TBEC
40.0000 mg | DELAYED_RELEASE_TABLET | Freq: Every day | ORAL | Status: DC
  Administered 2014-08-31 – 2014-09-02 (×3): 40 mg via ORAL
  Filled 2014-08-31 (×3): qty 1

## 2014-08-31 MED ORDER — ROSUVASTATIN CALCIUM 5 MG PO TABS
5.0000 mg | ORAL_TABLET | Freq: Every day | ORAL | Status: DC
  Administered 2014-08-31 – 2014-09-01 (×3): 5 mg via ORAL
  Filled 2014-08-31 (×6): qty 1

## 2014-08-31 MED ORDER — ADULT MULTIVITAMIN W/MINERALS CH
1.0000 | ORAL_TABLET | Freq: Every day | ORAL | Status: DC
  Administered 2014-08-31 – 2014-09-02 (×3): 1 via ORAL
  Filled 2014-08-31 (×3): qty 1

## 2014-08-31 MED ORDER — SODIUM CHLORIDE 0.9 % IV SOLN
INTRAVENOUS | Status: DC
  Administered 2014-08-31: 02:00:00 via INTRAVENOUS

## 2014-08-31 MED ORDER — PROMETHAZINE HCL 25 MG RE SUPP
25.0000 mg | Freq: Three times a day (TID) | RECTAL | Status: DC | PRN
  Filled 2014-08-31: qty 1

## 2014-08-31 NOTE — Progress Notes (Signed)
Patient ID: [name], female   DOB: 03/29/1934, 81 y.o.   MRN: [id number]  [facility] PROGRESS NOTE  [name] WUJ:[id number] DOB: [birth year] DOA: 08/30/2014 PCP: Lupita Raider, MD   Brief narrative:    81 y.o. year old female with history of TIA, HTN, HLD, hx/o rectal bleeding, last EGD 12/2013 with gastritis and suspected achalasia, resident of SNF, now presenting to [facility] ED with main concern of generalized weakness. Please note that pt is very poor historian, ? Underlying dementia possible reason, she is also very slow to respond to questions. She was brought in due to low Hg of 6.7 (last known Hg 13.3 (Feb 2016).  In ED, pt was hemodynamically stable, two units of PRBC requested for transfusion. FOBT positive. [facility] asked to admit for further evaluation.   Assessment/Plan:    Active Problems: Acute blood loss anemia - unclear exact source at this time - Hg up post 2 U PRBC: 6.2 --> 9.3 - will continue to monitor, if Hg stabilizes and no further drop in hg, it is reasonable to proceed with outpatient follow up - repeat CBC in AM - continue PPI Acute renal failure - likely secondary to the above - Cr has now stabilized with blood transfusions  ? Dementia - pt somewhat confused on exam this AM - will reassess in AM HLD - continue statin   DVT prophylaxis  Code Status: Full.  Family Communication:  plan of care discussed with the patient Disposition Plan: SNF in 24 - 48 hours   IV access:  Peripheral IV  Procedures and diagnostic studies:    No results found.  Medical Consultants:  Gi over the phone   Other Consultants:  Nutritionist   IAnti-Infectives:   None  Debbora Presto, MD  [facility] Pager [phone]  If 7PM-7AM, please contact night-coverage [email or web address] Password [email or web address] 08/31/2014, 2:52 PM     HPI/Subjective: No events overnight.   Objective: Filed Vitals:   08/31/14 0830 08/31/14 0900 08/31/14 1000 08/31/14 1337  BP:  155/74 131/58  164/89  Pulse:  83 85 101  Temp: 98.5 F (36.9 C)     TempSrc: Oral     Resp:    20  Height:      Weight:      SpO2:  100% 100% 100%    Intake/Output Summary (Last 24 hours) at 08/31/14 1452 Last data filed at 08/31/14 0413  Gross per 24 hour  Intake    560 ml  Output      0 ml  Net    560 ml    Exam:   General:  Pt is alert, follows commands appropriately, not in acute distress  Cardiovascular: Regular rhythm, tachycardic, no rubs, no gallops  Respiratory: Clear to auscultation bilaterally, no wheezing, no crackles, no rhonchi  Abdomen: Soft, non tender, non distended, bowel sounds present, no guarding  Extremities:  pulses DP and PT palpable bilaterally  Data Reviewed: Basic Metabolic Panel:  Recent Labs Lab 08/30/14 2058 08/31/14 0845  NA 140 141  K 3.9 3.8  CL 104 107  CO2 29 26  GLUCOSE 79 87  BUN 17 12  CREATININE 1.16* 0.90  CALCIUM 8.6 8.5   Liver Function Tests:  Recent Labs Lab 08/30/14 2058 08/31/14 0845  AST 21 19  ALT 13 11  ALKPHOS 84 85  BILITOT 0.5 1.0  PROT 5.4* 5.3*  ALBUMIN 2.8* 2.8*   CBC:  Recent Labs Lab 08/30/14 2058 08/31/14 0120 08/31/14 0845  WBC 6.2 5.2 6.2  NEUTROABS  --  1.9 3.0  HGB 6.7* 6.2* 9.3*  HCT 21.6* 20.7* 29.3*  MCV 90.0 90.8 91.6  PLT 263 242 239   Scheduled Meds: . multivitamin with minerals  1 tablet Oral Daily  . pantoprazole  40 mg Oral Daily  . rosuvastatin  5 mg Oral QHS   Continuous Infusions: . sodium chloride 75 mL/hr at 08/31/14 [phone]

## 2014-08-31 NOTE — Evaluation (Signed)
Physical Therapy Evaluation Patient Details Name: [name] BilisCarolyn A Meth MRN: [id number] DOB: 07/10/1933 Today's Date: 08/31/2014   History of Present Illness  Patient is a 81 y/o female with PMH of TIA, HTN, HLD, hx/o rectal bleeding, last EGD 12/2013 with gastritis and suspected achalasia, resident of SNF, [facility] with main concern of generalized weakness. She was brought in due to low Hg of 6.7 s/p 2 units of blood transfusion.    Clinical Impression  Patient presents with generalized weakness, cognitive deficits (early dementia?) and balance deficits impacting mobility. Pt poor historian and not able to state PLOF or level of mobility. Anticipate pt is total A for all ADls based on mobility assessment and most likely uses lift to transfer to w/c at SNF. Pt refused to stand during evaluation or transfer to chair - seemed anxious. Will need to call facility to determine pt's functional baseline to determine if PT is appropriate in this setting.     Follow Up Recommendations SNF;Supervision/Assistance - 24 hour    Equipment Recommendations  None recommended by PT    Recommendations for Other Services       Precautions / Restrictions Precautions Precautions: Fall Restrictions Weight Bearing Restrictions: No      Mobility  Bed Mobility Overal bed mobility: Needs Assistance Bed Mobility: Supine to Sit;Sit to Supine     Supine to sit: HOB elevated;Max assist Sit to supine: Mod assist;HOB elevated   General bed mobility comments: Cues for technique. Assist with bringing BLEs to EOB, scooting bottom to EOB and elevating trunk.   Transfers                 General transfer comment: Attempted to stand a few times, however pt with no effort to assist. Anxious about standing. Might use lift to get to w/c at SNF??  Ambulation/Gait                Stairs            Wheelchair Mobility    Modified Rankin (Stroke Patients Only)       Balance Overall  balance assessment: Needs assistance Sitting-balance support: Feet supported;Bilateral upper extremity supported Sitting balance-Leahy Scale: Fair Sitting balance - Comments: ABle to perform static and dynamic sitting EOB with UE support, no external support for short periods.                                      Pertinent Vitals/Pain Pain Assessment: No/denies pain    Home Living Family/patient expects to be discharged to:: Skilled nursing facility                 Additional Comments: Pt poor historian with inconsistent reports as far as mobility - not able to state transfer method.    Prior Function Level of Independence: Needs assistance   Gait / Transfers Assistance Needed: Pt reports being non ambulatory for ~ 2 years. Not able to state how pt transfers in/out of bed and [facility]  ADL's / Homemaking Assistance Needed: Reports assist with ADLs.        Hand Dominance   Dominant Hand: Right    Extremity/Trunk Assessment   Upper Extremity Assessment: Defer to OT evaluation (Good grip strength bilaterally.)           Lower Extremity Assessment: Generalized weakness;RLE deficits/detail;LLE deficits/detail;Difficult to assess due to impaired cognition RLE Deficits / Details: Seems  to be increased tone in BLEs however difficult to assess due to impaired cognition and pt resisting therapist. Grossly ~2+/5 throughout. Pt with wounds on BLEs distal to knee. LLE Deficits / Details: Seems to be increased tone in BLEs however difficult to assess due to impaired cognition and pt resisting therapist. Grossly ~2+/5 throughout. Pt with deformities in Bil feet into supination.    Communication   Communication: Other (comment) (Delayed processing.)  Cognition Arousal/Alertness: Awake/alert Behavior During Therapy: WFL for tasks assessed/performed Overall Cognitive Status: No family/caregiver present to determine baseline cognitive functioning (A&0x1. When  asked where she is, pt states, "epidural.")                      General Comments      Exercises        Assessment/Plan    PT Assessment Patient needs continued PT services  PT Diagnosis Generalized weakness   PT Problem List Decreased strength;Decreased cognition;Decreased mobility  PT Treatment Interventions Balance training;Functional mobility training;Therapeutic activities;Therapeutic exercise;Wheelchair mobility training;Patient/family education;Cognitive remediation   PT Goals (Current goals can be found in the Care Plan section) Acute Rehab PT Goals Patient Stated Goal: none stated PT Goal Formulation: Patient unable to participate in goal setting Time For Goal Achievement: 09/14/14 Potential to Achieve Goals: Fair    Frequency Min 2X/week   Barriers to discharge        Co-evaluation               End of Session Equipment Utilized During Treatment: Gait belt Activity Tolerance: Patient tolerated treatment well Patient left: in bed;with call bell/phone within reach;with bed alarm set Nurse Communication: Mobility status    Functional Assessment Tool Used: Clinical judgment Functional Limitation: Changing and maintaining body position Changing and Maintaining Body Position Current Status (Z6109): At least 80 percent but less than 100 percent impaired, limited or restricted Changing and Maintaining Body Position Goal Status (U0454): At least 60 percent but less than 80 percent impaired, limited or restricted    Time: 0981-1914 PT Time Calculation (min) (ACUTE ONLY): 19 min   Charges:   PT Evaluation $Initial PT Evaluation Tier I: 1 Procedure     PT G Codes:   PT G-Codes **NOT FOR INPATIENT CLASS** Functional Assessment Tool Used: Clinical judgment Functional Limitation: Changing and maintaining body position Changing and Maintaining Body Position Current Status (N8295): At least 80 percent but less than 100 percent impaired, limited or  restricted Changing and Maintaining Body Position Goal Status (A2130): At least 60 percent but less than 80 percent impaired, limited or restricted    Alvie Heidelberg A 08/31/2014, 4:27 PM Alvie Heidelberg, PT, DPT [phone]

## 2014-08-31 NOTE — H&P (Signed)
Hospitalist Admission History and Physical  Patient name: [name] Medical record number: [id number] Date of birth: 08/31/1933 Age: 81 y.o. Gender: female  Primary Care Provider: Lupita RaiderSHAW,KIMBERLEE, MD  Chief Complaint: anemia, GIB   History of Present Illness:This is a 81 y.o. year old female with significant past medical history of TIA, HTN, HLD, hx/o rectal bleeding  presenting with anemia, GIB. Pt resident of local skilled nursing facility. Fairly limited history as patient is overall poor story. Patient had a hemoglobin check with noted improvement of 6.7. Most recent hemoglobin 12.7 approximately one month ago. Patient reported to be asymptomatic. No reports of nausea vomiting or diarrhea. Presented to the ER afebrile, hemodynamically stable. Noted blood pressure in the upper 90s to 130s. Satting in the mid 90s on room air. Hemoglobin 6.7. Hemoccult-positive.  Assessment and Plan: [name] BilisCarolyn A Camps is a 81 y.o. year old female presenting with anemia, GIB  Active Problems:   Anemia   1- Anemia -likely secondary acute blood loss in setting of GIB -pending pRBC transfusion  -serial CBCs  -follow   2-GIB -hold potential offending agents  -PPI  -NPO overnight  -pRBC transfusion -consider GI c/s in am   3-HTN -LLN BPs on presentation -hold orals  -follow   FEN/GI: NPO  Prophylaxis: SCDs  Disposition: pending further evaluation  Code Status:Full Code    Patient Active Problem List   Diagnosis Date Noted  . Anemia 08/31/2014  . Achalasia -suspected by hx/ba swallow 01/11/2014  . Dysphagia, unspecified(787.20) 12/23/2013  . Hip fracture 08/29/2011  . Occlusion and stenosis of carotid artery without mention of cerebral infarction 07/17/2011  . Abdominal aneurysm without mention of rupture 06/06/2011  . Carotid stenosis, bilateral 06/06/2011  . Dyslipidemia 05/01/2011  . History of transient ischemic attack (TIA) 05/01/2011  . Neuropathy 05/01/2011  . Rectal bleeding  04/30/2011   Past Medical History: Past Medical History  Diagnosis Date  . Abdominal aneurysm without mention of rupture   . Cholelithiasis   . Hyperlipidemia   . TIA (transient ischemic attack) ~ 09/2010    "[name]"  . Neuropathic pain   . Osteoporosis   . Arthritis   . Hypertension 04/30/11    "not a problem now"  . Pneumonia     "once or twice"  . Migraines   . Headache(784.0)   . Peripheral neuropathy     "hands, feet, legs"  . Stroke     hx of TIA  . Chronic kidney disease     NEPHRECTOMY  . Achalasia -suspected by hx/ba swallow 01/11/2014    Past Surgical History: Past Surgical History  Procedure Laterality Date  . Carotid endarterectomy  03/1999; 04/1999    bilateral (Dr. Edilia Boickson)  . Nephrectomy  1954    "double left kidney out"  . Tonsillectomy      "when I was little"  . Abdominal hysterectomy  1984  . Tubal ligation  1975  . Dilation and curettage of uterus      "I've had a bunch of those"  . Colon surgery  2002    "8 inches of my colon taken out"  . Fracture surgery  02/2008    "femur; put rod & screws in"  . Fracture surgery  03/2008    "replaced screws in femur"  . Fracture surgery  04/2008    "replaced hardware S/P rebreak femur"  . Esophagogastroduodenoscopy N/A 01/11/2014    Procedure: ESOPHAGOGASTRODUODENOSCOPY (EGD);  Surgeon: Iva Booparl E Gessner, MD;  Location:  [facility];  Service: Endoscopy;  Laterality: N/A;  . Botox injection N/A 01/11/2014    Procedure: BOTOX INJECTION;  Surgeon: Iva Boop, MD;  Location: [facility];  Service: Endoscopy;  Laterality: N/A;  . Carotid angiogram N/A 06/30/2011    Procedure: CAROTID ANGIOGRAM;  Surgeon: Chuck Hint, MD;  Location: [facility] CATH LAB;  Service: Cardiovascular;  Laterality: N/A;    Social History: History   Social History  . Marital Status: Single    Spouse Name: N/A  . Number of Children: 2  . Years of Education: N/A   Social History Main Topics  .  Smoking status: Former Smoker -- 0.25 packs/day for 60 years    Types: Cigarettes    Start date: 06/17/2011  . Smokeless tobacco: Never Used  . Alcohol Use: No  . Drug Use: No     Comment: "I'm trying hard to quit smoking cigarettes"  . Sexual Activity: No   Other Topics Concern  . None   Social History Narrative    Family History: Family History  Problem Relation Age of Onset  . Diabetes Mother   . Heart disease Mother   . Hypertension Mother   . Hyperlipidemia Mother   . Aneurysm Father   . Diabetes Brother     Allergies: Allergies  Allergen Reactions  . Aminoglycosides Other (See Comments)    unknown  . Codeine Rash  . Erythromycin Other (See Comments)    unknown  . Macrolides And Ketolides Other (See Comments)    unknown  . Penicillins Rash  . Vancomycin Other (See Comments)    unknown    Current Facility-Administered Medications  Medication Dose Route Frequency Provider Last Rate Last Dose  . 0.9 %  sodium chloride infusion   Intravenous Continuous Floydene Flock, MD      . ipratropium-albuterol (DUONEB) 0.5-2.5 (3) MG/3ML nebulizer solution 3 mL  3 mL Nebulization Q6H PRN Floydene Flock, MD      . multivitamin tablet 1 tablet  1 tablet Oral Daily Floydene Flock, MD      . pantoprazole (PROTONIX) EC tablet 40 mg  40 mg Oral Daily Floydene Flock, MD      . promethazine (PHENERGAN) suppository 25 mg  25 mg Rectal Q8H PRN Floydene Flock, MD      . rosuvastatin (CRESTOR) tablet 5 mg  5 mg Oral QHS Floydene Flock, MD       Current Outpatient Prescriptions  Medication Sig Dispense Refill  . amLODipine (NORVASC) 2.5 MG tablet Take 2.5 mg by mouth daily.     Marland Kitchen aspirin EC 325 MG tablet Take 325 mg by mouth every morning.    . bisacodyl (DULCOLAX) 5 MG EC tablet Take 5 mg by mouth every evening.    . Cranberry 475 MG CAPS Take 1 capsule by mouth 2 (two) times daily.    Marland Kitchen CREON 24000 UNITS CPEP Take 2 capsules by mouth 3 (three) times daily before meals.     Marland Kitchen  escitalopram (LEXAPRO) 10 MG tablet Take 10 mg by mouth daily.    . fish oil-omega-3 fatty acids 1000 MG capsule Take 2 g by mouth every morning.    Marland Kitchen HYDROcodone-acetaminophen (NORCO/VICODIN) 5-325 MG per tablet Take 1 tablet by mouth every 6 (six) hours as needed for moderate pain.    . hydrOXYzine (ATARAX/VISTARIL) 25 MG tablet Take 25 mg by mouth 3 (three) times daily as needed for itching.    Marland Kitchen ipratropium-albuterol (DUONEB) 0.5-2.5 (3)  MG/3ML SOLN Take 3 mLs by nebulization every 6 (six) hours as needed (for shortness of breath).     . Multiple Vitamin (MULTIVITAMIN) tablet Take 1 tablet by mouth daily.    . multivitamin-lutein (OCUVITE-LUTEIN) CAPS capsule Take 1 capsule by mouth daily.    . polyethylene glycol (MIRALAX / GLYCOLAX) packet Take 17 g by mouth every other day.     . pregabalin (LYRICA) 75 MG capsule Take 75 mg by mouth 3 (three) times daily.     . promethazine (PHENERGAN) 25 MG suppository Place 25 mg rectally every 8 (eight) hours as needed for nausea or vomiting.    Marland Kitchen Propylene Glycol (SYSTANE BALANCE) 0.6 % SOLN Apply 2 drops to eye 3 (three) times daily.     . rosuvastatin (CRESTOR) 5 MG tablet Take 5 mg by mouth at bedtime.    . senna (SENOKOT) 8.6 MG tablet Take 1 tablet by mouth daily.    . sodium chloride (OCEAN) 0.65 % SOLN nasal spray Place 1 spray into both nostrils 4 (four) times daily.    . Vitamin D, Ergocalciferol, (DRISDOL) 50000 UNITS CAPS capsule Take 50,000 Units by mouth every 30 (thirty) days.    . cefUROXime (CEFTIN) 250 MG tablet Take 1 tablet (250 mg total) by mouth 2 (two) times daily with a meal. 10 tablet 0  . omeprazole (PRILOSEC) 20 MG capsule Take 20 mg by mouth daily.     Review Of Systems: 12 point ROS negative except as noted above in HPI.  Physical Exam: Filed Vitals:   08/30/14 2315  BP: 110/38  Pulse: 72  Temp:   Resp: 16    General: cooperative HEENT: PERRLA and extra ocular movement intact Heart: S1, S2 normal, no murmur, rub  or gallop, regular rate and rhythm Lungs: clear to auscultation, no wheezes or rales and unlabored breathing Abdomen: abdomen is soft without significant tenderness, masses, organomegaly or guarding Extremities: extremities normal, atraumatic, no cyanosis or edema Skin:no rashes Neurology: normal without focal findings  Labs and Imaging: Lab Results  Component Value Date/Time   NA 140 08/30/2014 08:58 PM   K 3.9 08/30/2014 08:58 PM   CL 104 08/30/2014 08:58 PM   CO2 29 08/30/2014 08:58 PM   BUN 17 08/30/2014 08:58 PM   CREATININE 1.16* 08/30/2014 08:58 PM   GLUCOSE 79 08/30/2014 08:58 PM   Lab Results  Component Value Date   WBC 6.2 08/30/2014   HGB 6.7* 08/30/2014   HCT 21.6* 08/30/2014   MCV 90.0 08/30/2014   PLT 263 08/30/2014    No results found.         Doree Albee MD  Pager: [phone]

## 2014-08-31 NOTE — Progress Notes (Signed)
Pt arrived to 4N22, settled and welcomed to the unit.  VSS, SCD's applied.  Call bell within reach.  Will continue to monitor. Sondra ComeSilva, Serin Thornell M, RN

## 2014-09-01 DIAGNOSIS — E876 Hypokalemia: Secondary | ICD-10-CM | POA: Diagnosis present

## 2014-09-01 DIAGNOSIS — M810 Age-related osteoporosis without current pathological fracture: Secondary | ICD-10-CM | POA: Diagnosis present

## 2014-09-01 DIAGNOSIS — D559 Anemia due to enzyme disorder, unspecified: Secondary | ICD-10-CM | POA: Diagnosis not present

## 2014-09-01 DIAGNOSIS — E440 Moderate protein-calorie malnutrition: Secondary | ICD-10-CM | POA: Insufficient documentation

## 2014-09-01 DIAGNOSIS — G629 Polyneuropathy, unspecified: Secondary | ICD-10-CM | POA: Diagnosis present

## 2014-09-01 DIAGNOSIS — Z7982 Long term (current) use of aspirin: Secondary | ICD-10-CM | POA: Diagnosis not present

## 2014-09-01 DIAGNOSIS — R531 Weakness: Secondary | ICD-10-CM | POA: Diagnosis present

## 2014-09-01 DIAGNOSIS — F0390 Unspecified dementia without behavioral disturbance: Secondary | ICD-10-CM | POA: Diagnosis present

## 2014-09-01 DIAGNOSIS — E785 Hyperlipidemia, unspecified: Secondary | ICD-10-CM | POA: Diagnosis present

## 2014-09-01 DIAGNOSIS — Z87891 Personal history of nicotine dependence: Secondary | ICD-10-CM | POA: Diagnosis not present

## 2014-09-01 DIAGNOSIS — N179 Acute kidney failure, unspecified: Secondary | ICD-10-CM | POA: Diagnosis present

## 2014-09-01 DIAGNOSIS — Z79899 Other long term (current) drug therapy: Secondary | ICD-10-CM | POA: Diagnosis not present

## 2014-09-01 DIAGNOSIS — D62 Acute posthemorrhagic anemia: Secondary | ICD-10-CM | POA: Diagnosis present

## 2014-09-01 DIAGNOSIS — Z905 Acquired absence of kidney: Secondary | ICD-10-CM | POA: Diagnosis present

## 2014-09-01 DIAGNOSIS — I10 Essential (primary) hypertension: Secondary | ICD-10-CM | POA: Diagnosis present

## 2014-09-01 DIAGNOSIS — K922 Gastrointestinal hemorrhage, unspecified: Secondary | ICD-10-CM | POA: Diagnosis present

## 2014-09-01 DIAGNOSIS — I714 Abdominal aortic aneurysm, without rupture: Secondary | ICD-10-CM | POA: Diagnosis present

## 2014-09-01 DIAGNOSIS — M1990 Unspecified osteoarthritis, unspecified site: Secondary | ICD-10-CM | POA: Diagnosis present

## 2014-09-01 DIAGNOSIS — Z8673 Personal history of transient ischemic attack (TIA), and cerebral infarction without residual deficits: Secondary | ICD-10-CM | POA: Diagnosis not present

## 2014-09-01 DIAGNOSIS — Z6827 Body mass index (BMI) 27.0-27.9, adult: Secondary | ICD-10-CM | POA: Diagnosis not present

## 2014-09-01 LAB — BASIC METABOLIC PANEL
Anion gap: 6 (ref 5–15)
BUN: 11 mg/dL (ref 6–23)
CO2: 28 mmol/L (ref 19–32)
Calcium: 8.5 mg/dL (ref 8.4–10.5)
Chloride: 107 mmol/L (ref 96–112)
Creatinine, Ser: 1.01 mg/dL (ref 0.50–1.10)
GFR, EST AFRICAN AMERICAN: 60 mL/min — AB (ref >90)
GFR, EST NON AFRICAN AMERICAN: 52 mL/min — AB (ref >90)
Glucose, Bld: 80 mg/dL (ref 70–99)
Potassium: 3.4 mmol/L — ABNORMAL LOW (ref 3.5–5.1)
Sodium: 141 mmol/L (ref 135–145)

## 2014-09-01 LAB — CBC
HCT: 27.6 % — ABNORMAL LOW (ref 36.0–46.0)
Hemoglobin: 8.9 g/dL — ABNORMAL LOW (ref 12.0–15.0)
MCH: 28.8 pg (ref 26.0–34.0)
MCHC: 32.2 g/dL (ref 30.0–36.0)
MCV: 89.3 fL (ref 78.0–100.0)
Platelets: 260 10*3/uL (ref 150–400)
RBC: 3.09 MIL/uL — ABNORMAL LOW (ref 3.87–5.11)
RDW: 14.9 % (ref 11.5–15.5)
WBC: 5.6 10*3/uL (ref 4.0–10.5)

## 2014-09-01 LAB — IRON AND TIBC
IRON: 17 ug/dL — AB (ref 42–145)
Saturation Ratios: 5 % — ABNORMAL LOW (ref 20–55)
TIBC: 367 ug/dL (ref 250–470)
UIBC: 350 ug/dL (ref 125–400)

## 2014-09-01 LAB — RETICULOCYTES
RBC.: 3.22 MIL/uL — AB (ref 3.87–5.11)
RETIC CT PCT: 3.9 % — AB (ref 0.4–3.1)
Retic Count, Absolute: 125.6 10*3/uL (ref 19.0–186.0)

## 2014-09-01 LAB — TYPE AND SCREEN
ABO/RH(D): O POS
Antibody Screen: NEGATIVE
UNIT DIVISION: 0
Unit division: 0

## 2014-09-01 LAB — VITAMIN B12: Vitamin B-12: 250 pg/mL (ref 211–911)

## 2014-09-01 LAB — FOLATE: Folate: >20 ng/mL

## 2014-09-01 LAB — FERRITIN: Ferritin: 60 ng/mL (ref 10–291)

## 2014-09-01 MED ORDER — ACETAMINOPHEN 325 MG PO TABS
650.0000 mg | ORAL_TABLET | Freq: Four times a day (QID) | ORAL | Status: DC | PRN
  Administered 2014-09-01 (×2): 650 mg via ORAL
  Filled 2014-09-01 (×2): qty 2

## 2014-09-01 MED ORDER — ENSURE PUDDING PO PUDG
1.0000 | Freq: Two times a day (BID) | ORAL | Status: DC
  Administered 2014-09-02 (×2): 1 via ORAL

## 2014-09-01 MED ORDER — POTASSIUM CHLORIDE CRYS ER 20 MEQ PO TBCR
40.0000 meq | EXTENDED_RELEASE_TABLET | Freq: Once | ORAL | Status: AC
  Administered 2014-09-01: 40 meq via ORAL
  Filled 2014-09-01: qty 2

## 2014-09-01 MED ORDER — LORAZEPAM 2 MG/ML IJ SOLN
0.5000 mg | Freq: Once | INTRAMUSCULAR | Status: AC
  Administered 2014-09-01: 0.5 mg via INTRAVENOUS
  Filled 2014-09-01: qty 1

## 2014-09-01 NOTE — Progress Notes (Signed)
Patient ID: [name], female   DOB: 07-27-1933, 81 y.o.   MRN: [id number]  [facility] PROGRESS NOTE  [name]:[id number] DOB: 11-30-1933 DOA: 08/30/2014 PCP: Lupita Raider, MD   Brief narrative:    81 y.o. year old female with history of TIA, HTN, HLD, hx/o rectal bleeding, last EGD 12/2013 with gastritis and suspected achalasia, resident of SNF, now presenting to [facility] ED with main concern of generalized weakness. Please note that pt is very poor historian, ? Underlying dementia possible reason, she is also very slow to respond to questions. She was brought in due to low Hg of 6.7 (last known Hg 13.3 (Feb 2016).  In ED, pt was hemodynamically stable, two units of PRBC requested for transfusion. FOBT positive. [facility] asked to admit for further evaluation.   Assessment/Plan:    Active Problems: Acute blood loss anemia - unclear exact source at this time - Hg up post 2 U PRBC: 6.2 --> 9.3 --> 8.9 - will continue to monitor, if Hg stabilizes and no further drop in hg, it is reasonable to proceed with outpatient follow up - repeat CBC in AM - continue PPI - anemia panel pending Acute renal failure - likely secondary to the above - Cr has now stabilized with blood transfusions and is WNL this AM ? Dementia - clearer mental status - PT evaluation requested  Hypokalemia - mild, will supplement and repeat BMP in AM HLD - continue statin   DVT prophylaxis: SCD's  Code Status: Full.  Family Communication:  plan of care discussed with the patient Disposition Plan: SNF in 24if Hg stable   IV access:  Peripheral IV  Procedures and diagnostic studies:    No results found.  Medical Consultants:  Gi over the phone   Other Consultants:  Nutritionist   IAnti-Infectives:   None  Debbora Presto, MD  [facility] Pager [phone]  If 7PM-7AM, please contact night-coverage [email or web address] Password [facility] 09/01/2014, 8:44 AM  HPI/Subjective: No events overnight.    Objective: Filed Vitals:   08/31/14 1600 08/31/14 2102 09/01/14 0023 09/01/14 0455  BP: 143/56 134/68 123/69 128/56  Pulse: 93 92 88 82  Temp: 97.8 F (36.6 C) 97.3 F (36.3 C) 97.3 F (36.3 C) 97.5 F (36.4 C)  TempSrc: Oral Oral Oral Oral  Resp: Height:      Weight:      SpO2: 97% 100% 94% 94%   No intake or output data in the 24 hours ending 09/01/14 0844  Exam:   General:  Pt is alert, follows commands appropriately, not in acute distress  Cardiovascular: Regular rhythm, no gallops  Respiratory: Clear to auscultation bilaterally, no wheezing, no crackles, no rhonchi  Abdomen: Soft, non tender, non distended, bowel sounds present, no guarding  Extremities:  pulses DP and PT palpable bilaterally  Data Reviewed: Basic Metabolic Panel:  Recent Labs Lab 08/30/14 2058 08/31/14 0845 09/01/14 0606  NA 140 141 141  K 3.9 3.8 3.4*  CL 104 107 107  CO2 GLUCOSE 79 87 80  BUN CREATININE 1.16* 0.90 1.01  CALCIUM 8.6 8.5 8.5   Liver Function Tests:  Recent Labs Lab 08/30/14 2058 08/31/14 0845  AST 21 19  ALT 13 11  ALKPHOS 84 85  BILITOT 0.5 1.0  PROT 5.4* 5.3*  ALBUMIN 2.8* 2.8*   CBC:  Recent Labs Lab 08/30/14 2058 08/31/14 0120 08/31/14 0845 09/01/14 0606  WBC 6.2 5.2 6.2 5.6  NEUTROABS  --  1.9 3.0  --   HGB 6.7* 6.2* 9.3* 8.9*  HCT 21.6* 20.7* 29.3* 27.6*  MCV 90.0 90.8 91.6 89.3  PLT 263 242 239 260   Scheduled Meds: . multivitamin with minerals  1 tablet Oral Daily  . pantoprazole  40 mg Oral Daily  . potassium chloride  40 mEq Oral Once  . rosuvastatin  5 mg Oral QHS   Continuous Infusions: . sodium chloride 50 mL/hr at 08/31/14 1638

## 2014-09-01 NOTE — Progress Notes (Signed)
INITIAL NUTRITION ASSESSMENT  DOCUMENTATION CODES Per approved criteria  -Non-severe (moderate) malnutrition in the context of chronic illness  Pt meets criteria for MODERATE MALNUTRITION in the context of CHRONIC ILLNESS as evidenced by moderate loss of subcutaneous fat mass and moderate muscle wasting per physical exam.  INTERVENTION: Provide Ensure Pudding BID,  each supplement provides 170 kcal and 4 grams of protein Provide Magic Cup BID with meals, each supplement provides 290 kcal and 9 grams of protein Provide Snack once daily Provide Multivitamin with minerals daily  NUTRITION DIAGNOSIS: Inadequate oral intake related to poor appetite as evidenced by <25% meal completion.   Goal: Pt to meet >/= 90% of their estimated nutrition needs   Monitor:  PO intake, supplement intake, weight trend, labs  Reason for Assessment: Consult for assessment   81 y.o. female  Admitting Dx: <principal problem not specified>  ASSESSMENT: 81 y.o. year old female with history of TIA, HTN, HLD, hx/o rectal bleeding, last EGD 12/2013 with gastritis and suspected achalasia, resident of SNF, [facility] with main concern of generalized weakness.   Pt with lunch tray in front of her at time of visit- only consumed a few bites. Pt reports having a poor appetite for the past few days. She denies any nausea or abdominal pain. Per RN pt only ate a few bites at breakfast too. Patient states that she usually eats well. She reports weight loss but, also states that she usually weighs 130 lbs. Pt reports being bed bound for past 2 years. Encouraged pt to try and eat at each meal. Pt states she likes vanilla pudding, ice cream, yogurt, and peaches. RD provided Ensure Pudding, assisted pt in feeding.   Labs: low hemoglobin, low albumin   Nutrition Focused Physical Exam:  Subcutaneous Fat:  Orbital Region: mild wasting Upper Arm Region: moderate wasting Thoracic and Lumbar Region: NA  Muscle:   Temple Region: mild wasting Clavicle Bone Region: wnl Clavicle and Acromion Bone Region: wnl Scapular Bone Region: NA Dorsal Hand: wnl Patellar Region: moderate wasting Anterior Thigh Region: moderate wasting Posterior Calf Region: moderate wasting  Edema: none noted  Height: Ht Readings from Last 1 Encounters:  08/30/14 5\' 6"  (1.676 m)    Weight: Wt Readings from Last 1 Encounters:  08/30/14 170 lb (77.111 kg)    Ideal Body Weight: 130 lbs  % Ideal Body Weight: 131%  Wt Readings from Last 10 Encounters:  08/30/14 170 lb (77.111 kg)  01/11/14 158 lb (71.668 kg)  08/06/12 162 lb (73.483 kg)  11/16/11 162 lb (73.483 kg)  08/29/11 165 lb (74.844 kg)  07/17/11 165 lb (74.844 kg)  06/30/11 160 lb (72.576 kg)  06/06/11 160 lb (72.576 kg)  04/30/11 145 lb 8.1 oz (66 kg)  09/26/10 180 lb (81.647 kg)    Usual Body Weight: unknown  % Usual Body Weight: NA  BMI:  Body mass index is 27.45 kg/(m^2).  Estimated Nutritional Needs: Kcal: 1700-1900 Protein: 90-100 grams Fluid: 1.7-1.9 L/day  Skin: abrasions on legs  Diet Order: Diet regular  EDUCATION NEEDS: -No education needs identified at this time   Intake/Output Summary (Last 24 hours) at 09/01/14 1523 Last data filed at 09/01/14 1440  Gross per 24 hour  Intake      0 ml  Output      0 ml  Net      0 ml    Last BM: PTA  Labs:   Recent Labs Lab 08/30/14 2058 08/31/14 0845 09/01/14 0606  NA 140 141 141  K 3.9 3.8 3.4*  CL 104 107 107  CO2 BUN CREATININE 1.16* 0.90 1.01  CALCIUM 8.6 8.5 8.5  GLUCOSE 79 87 80    CBG (last 3)  No results for input(s): GLUCAP in the last 72 hours.  Scheduled Meds: . multivitamin with minerals  1 tablet Oral Daily  . pantoprazole  40 mg Oral Daily  . rosuvastatin  5 mg Oral QHS    Continuous Infusions:   Past Medical History  Diagnosis Date  . Abdominal aneurysm without mention of rupture   . Cholelithiasis   . Hyperlipidemia   .  TIA (transient ischemic attack) ~ 09/2010    "[name]"  . Neuropathic pain   . Osteoporosis   . Arthritis   . Hypertension 04/30/11    "not a problem now"  . Pneumonia     "once or twice"  . Migraines   . Headache(784.0)   . Peripheral neuropathy     "hands, feet, legs"  . Stroke     hx of TIA  . Chronic kidney disease     NEPHRECTOMY  . Achalasia -suspected by hx/ba swallow 01/11/2014    Past Surgical History  Procedure Laterality Date  . Carotid endarterectomy  03/1999; 04/1999    bilateral (Dr. Edilia Bo)  . Nephrectomy  1954    "double left kidney out"  . Tonsillectomy      "when I was little"  . Abdominal hysterectomy  1984  . Tubal ligation  1975  . Dilation and curettage of uterus      "I've had a bunch of those"  . Colon surgery  2002    "8 inches of my colon taken out"  . Fracture surgery  02/2008    "femur; put rod & screws in"  . Fracture surgery  03/2008    "replaced screws in femur"  . Fracture surgery  04/2008    "replaced hardware S/P rebreak femur"  . Esophagogastroduodenoscopy N/A 01/11/2014    Procedure: ESOPHAGOGASTRODUODENOSCOPY (EGD);  Surgeon: Iva Boop, MD;  Location: [facility];  Service: Endoscopy;  Laterality: N/A;  . Botox injection N/A 01/11/2014    Procedure: BOTOX INJECTION;  Surgeon: Iva Boop, MD;  Location: [facility];  Service: Endoscopy;  Laterality: N/A;  . Carotid angiogram N/A 06/30/2011    Procedure: CAROTID ANGIOGRAM;  Surgeon: Chuck Hint, MD;  Location: [facility] CATH LAB;  Service: Cardiovascular;  Laterality: N/A;    Ian Malkin RD, LDN Inpatient Clinical Dietitian Pager: [phone] After Hours Pager: [phone]

## 2014-09-01 NOTE — Clinical Social Work Psychosocial (Signed)
Clinical Social Work Department BRIEF PSYCHOSOCIAL ASSESSMENT 09/01/2014  Patient:  [name],[name]     Account Number:  [id number]     Admit date:  08/30/2014  Clinical Social Worker:  Bo McclintockBIBBS,Placida Cambre, LCSWA  Date/Time:  09/01/2014 03:45 PM  Referred by:  RN  Date Referred:  09/01/2014 Referred for  SNF Placement   Other Referral:   Interview type:  Family Other interview type:   Pt's is not oriented; [name] son [phone]    PSYCHOSOCIAL DATA Living Status:  FACILITY [facility] Level of care:  Long Term Acute Primary support name:  [name] Primary support relationship to patient:  CHILD, ADULT Degree of support available:   Strong Support    CURRENT CONCERNS Current Concerns  None Noted   Other Concerns:    SOCIAL WORK ASSESSMENT / PLAN CSW spoke with the pt's son [name].  CSW introduced self and purpose of the visit. [name] confirmed the pt [name]. [name] reported the pt will return back. CSW answered all questions in which the [place] inquired about. CSW provided [place] with contact information for further questions. CSW will continue to follow this pt and assist with discharge as needed.   Assessment/plan status:  Psychosocial Support/Ongoing Assessment of Needs Other assessment/ plan:   Information/referral to community resources:    PATIENT'S/FAMILY'S RESPONSE TO CURRENT DIAGNOSE: [name] acknowledged the pt has decline in health. [name] reported he could not take her of the pt, thus leading him to place the pt in [name].   PATIENT'S/FAMILY'S RESPONSE TO PLAN OF CARE: [name] acknowledged and agreed to the plan for the pt to return back to SNF.   Ameen Mostafa, MSW, LCSWA [phone]

## 2014-09-01 NOTE — Progress Notes (Signed)
UR completed 

## 2014-09-02 LAB — CBC
HCT: 35.9 % — ABNORMAL LOW (ref 36.0–46.0)
Hemoglobin: 11.5 g/dL — ABNORMAL LOW (ref 12.0–15.0)
MCH: 28.8 pg (ref 26.0–34.0)
MCHC: 32 g/dL (ref 30.0–36.0)
MCV: 89.8 fL (ref 78.0–100.0)
PLATELETS: 300 10*3/uL (ref 150–400)
RBC: 4 MIL/uL (ref 3.87–5.11)
RDW: 15 % (ref 11.5–15.5)
WBC: 9.7 10*3/uL (ref 4.0–10.5)

## 2014-09-02 LAB — BASIC METABOLIC PANEL
Anion gap: 11 (ref 5–15)
BUN: 9 mg/dL (ref 6–23)
CO2: 25 mmol/L (ref 19–32)
Calcium: 9.4 mg/dL (ref 8.4–10.5)
Chloride: 104 mmol/L (ref 96–112)
Creatinine, Ser: 1.01 mg/dL (ref 0.50–1.10)
GFR calc Af Amer: 60 mL/min — ABNORMAL LOW (ref >90)
GFR, EST NON AFRICAN AMERICAN: 52 mL/min — AB (ref >90)
Glucose, Bld: 85 mg/dL (ref 70–99)
Potassium: 4.5 mmol/L (ref 3.5–5.1)
Sodium: 140 mmol/L (ref 135–145)

## 2014-09-02 MED ORDER — HYDROCODONE-ACETAMINOPHEN 5-325 MG PO TABS: 1.0000 | ORAL_TABLET | Freq: Four times a day (QID) | ORAL | Status: AC | PRN

## 2014-09-02 NOTE — Discharge Instructions (Signed)

## 2014-09-02 NOTE — Progress Notes (Signed)
Pt to tx to [facility] via [facility].  Left VM message for son informing him of such.  No return call.

## 2014-09-02 NOTE — Discharge Summary (Signed)
Physician Discharge Summary  Silvano BilisCarolyn A Orellana ZOX:[id number] DOB: 07/28/1933 DOA: 08/30/2014  PCP: Lupita RaiderSHAW,KIMBERLEE, MD  Admit date: 08/30/2014 Discharge date: 09/02/2014  Recommendations for Outpatient Follow-up:  1. Pt will need to follow up with PCP in 2-3 weeks post discharge 2. Please obtain BMP to evaluate electrolytes and kidney function 3. Please also check CBC to evaluate Hg and Hct levels 4. Please note the appointment time and date with [facility]   Discharge Diagnoses:  Active Problems:   Anemia   Discharge Condition: Stable  Diet recommendation: Heart healthy diet discussed in details     Brief narrative:    81 y.o. year old female with history of TIA, HTN, HLD, hx/o rectal bleeding, last EGD 12/2013 with gastritis and suspected achalasia, resident of SNF, now [facility] with main concern of generalized weakness. Please note that pt is very poor historian, ? Underlying dementia possible reason, she is also very slow to respond to questions. She was brought in due to low Hg of 6.7 (last known Hg 13.3 (Feb 2016).  In ED, pt was hemodynamically stable, two units of PRBC requested for transfusion. FOBT positive. TRH asked to admit for further evaluation.   Assessment/Plan:    Active Problems: Acute blood loss anemia - unclear exact source at this time - Hg up post 2 U PRBC: 6.2 --> 9.3 --> 8.9 --> 11.5 - d/w GI on call, outpatient follow up recommended, no need for an intervention at this time   Acute renal failure - likely secondary to the above - Cr has now stabilized with blood transfusions and is WNL this AM ? Dementia - clearer mental status Hypokalemia - supplemented and WNL HLD - continue statin  Moderate malnutrition - in the setting of chronic illness, ? Dementia - tolerating diet well  DVT prophylaxis: SCD's  Code Status: Full.  Family Communication: plan of care discussed with the patient Disposition Plan: SNF   IV access:   Peripheral IV  Procedures and diagnostic studies:    Imaging Results    No results found.    Medical Consultants:  Gi over the phone   Other Consultants:  Nutritionist   IAnti-Infectives:   None        Discharge Exam: Filed Vitals:   09/02/14 0542  BP: 153/73  Pulse: 82  Temp: 98.1 F (36.7 C)  Resp: 20   Filed Vitals:   09/01/14 1814 09/01/14 2109 09/02/14 0319 09/02/14 0542  BP: 134/67 165/83 158/71 153/73  Pulse: 73  94 82  Temp: 98.1 F (36.7 C) 97.9 F (36.6 C) 97.4 F (36.3 C) 98.1 F (36.7 C)  TempSrc: Oral Oral Oral Oral  Resp: 18 18 18 20   Height:      Weight:      SpO2: 96% 95% 90% 96%    General: Pt is alert, follows commands appropriately, not in acute distress Cardiovascular: Regular rate and rhythm, no rubs, no gallops Respiratory: Clear to auscultation bilaterally, no wheezing, no crackles, no rhonchi Abdominal: Soft, non tender, non distended, bowel sounds +, no guarding Extremities: no edema, no cyanosis, pulses palpable bilaterally DP and PT Neuro: Grossly nonfocal  Discharge Instructions  Discharge Instructions    Diet - low sodium heart healthy    Complete by:  As directed      Increase activity slowly    Complete by:  As directed             Medication List    STOP taking these medications  cefUROXime 250 MG tablet  Commonly known as:  CEFTIN      TAKE these medications        amLODipine 2.5 MG tablet  Commonly known as:  NORVASC  Take 2.5 mg by mouth daily.     aspirin EC 325 MG tablet  Take 325 mg by mouth every morning.     bisacodyl 5 MG EC tablet  Commonly known as:  DULCOLAX  Take 5 mg by mouth every evening.     Cranberry 475 MG Caps  Take 1 capsule by mouth 2 (two) times daily.     CREON 24000 UNITS Cpep  Generic drug:  Pancrelipase (Lip-Prot-Amyl)  Take 2 capsules by mouth 3 (three) times daily before meals.     escitalopram 10 MG tablet  Commonly known as:  LEXAPRO  Take 10  mg by mouth daily.     fish oil-omega-3 fatty acids 1000 MG capsule  Take 2 g by mouth every morning.     HYDROcodone-acetaminophen 5-325 MG per tablet  Commonly known as:  NORCO/VICODIN  Take 1 tablet by mouth every 6 (six) hours as needed for moderate pain.     hydrOXYzine 25 MG tablet  Commonly known as:  ATARAX/VISTARIL  Take 25 mg by mouth 3 (three) times daily as needed for itching.     ipratropium-albuterol 0.5-2.5 (3) MG/3ML Soln  Commonly known as:  DUONEB  Take 3 mLs by nebulization every 6 (six) hours as needed (for shortness of breath).     multivitamin tablet  Take 1 tablet by mouth daily.     multivitamin-lutein Caps capsule  Take 1 capsule by mouth daily.     omeprazole 20 MG capsule  Commonly known as:  PRILOSEC  Take 20 mg by mouth daily.     polyethylene glycol packet  Commonly known as:  MIRALAX / GLYCOLAX  Take 17 g by mouth every other day.     pregabalin 75 MG capsule  Commonly known as:  LYRICA  Take 75 mg by mouth 3 (three) times daily.     promethazine 25 MG suppository  Commonly known as:  PHENERGAN  Place 25 mg rectally every 8 (eight) hours as needed for nausea or vomiting.     rosuvastatin 5 MG tablet  Commonly known as:  CRESTOR  Take 5 mg by mouth at bedtime.     senna 8.6 MG tablet  Commonly known as:  SENOKOT  Take 1 tablet by mouth daily.     sodium chloride 0.65 % Soln nasal spray  Commonly known as:  OCEAN  Place 1 spray into both nostrils 4 (four) times daily.     SYSTANE BALANCE 0.6 % Soln  Generic drug:  Propylene Glycol  Apply 2 drops to eye 3 (three) times daily.     Vitamin D (Ergocalciferol) 50000 UNITS Caps capsule  Commonly known as:  DRISDOL  Take 50,000 Units by mouth every 30 (thirty) days.           Follow-up Information    Follow up with ZEHR, JESSICA D., PA-C On 09/14/2014.   Specialty:  Gastroenterology   Why:  10 AM appointment with Dr Marvell Fuller PA.  for evaluation of low blood counts requiring  transfusion and blood detected in stool   Contact information:   [address] [place] [address] [phone]       Follow up with Lupita Raider, MD.   Specialty:  Family Medicine   Contact information:   [address] [employer]  [address] [place] [address] [phone]        The results of significant diagnostics from this hospitalization (including imaging, microbiology, ancillary and laboratory) are listed below for reference.     Microbiology: No results found for this or any previous visit (from the past 240 hour(s)).   Labs: Basic Metabolic Panel:  Recent Labs Lab 08/30/14 2058 08/31/14 0845 09/01/14 0606 09/02/14 0620  NA 140 141 141 140  K 3.9 3.8 3.4* 4.5  CL 104 107 107 104  CO2 GLUCOSE 79 87 80 85  BUN CREATININE 1.16* 0.90 1.01 1.01  CALCIUM 8.6 8.5 8.5 9.4   Liver Function Tests:  Recent Labs Lab 08/30/14 2058 08/31/14 0845  AST 21 19  ALT 13 11  ALKPHOS 84 85  BILITOT 0.5 1.0  PROT 5.4* 5.3*  ALBUMIN 2.8* 2.8*   No results for input(s): LIPASE, AMYLASE in the last 168 hours. No results for input(s): AMMONIA in the last 168 hours. CBC:  Recent Labs Lab 08/30/14 2058 08/31/14 0120 08/31/14 0845 09/01/14 0606 09/02/14 0620  WBC 6.2 5.2 6.2 5.6 9.7  NEUTROABS  --  1.9 3.0  --   --   HGB 6.7* 6.2* 9.3* 8.9* 11.5*  HCT 21.6* 20.7* 29.3* 27.6* 35.9*  MCV 90.0 90.8 91.6 89.3 89.8  PLT 263 242 239 260 300     SIGNED: Time coordinating discharge: Over 30 minutes  Debbora Presto, MD  [facility] 09/02/2014, 8:11 AM Pager [phone]  If 7PM-7AM, please contact night-coverage [email or web address] Password [email or web address]

## 2014-09-13 ENCOUNTER — Encounter: Payer: Self-pay | Admitting: *Deleted

## 2014-09-14 ENCOUNTER — Encounter: Payer: Self-pay | Admitting: Gastroenterology

## 2014-09-14 ENCOUNTER — Ambulatory Visit (INDEPENDENT_AMBULATORY_CARE_PROVIDER_SITE_OTHER): Payer: Medicare Other | Admitting: Gastroenterology

## 2014-09-14 VITALS — BP 118/62 | HR 68 | Ht 63.0 in | Wt 161.1 lb

## 2014-09-14 DIAGNOSIS — R195 Other fecal abnormalities: Secondary | ICD-10-CM | POA: Insufficient documentation

## 2014-09-14 DIAGNOSIS — D649 Anemia, unspecified: Secondary | ICD-10-CM | POA: Diagnosis not present

## 2014-09-14 DIAGNOSIS — K5900 Constipation, unspecified: Secondary | ICD-10-CM | POA: Diagnosis not present

## 2014-09-14 NOTE — Patient Instructions (Addendum)
We will speak with Dr Leone PayorGessner regarding getting you scheduled for a colonoscopy at [facility].  When we get a date and time set for your procedure, you will need to have a previsit with a nurse. Your Power of Attorney MUST be present at that visit as well as at colonoscopy.  [name] should be monitoring your Hemoglobin.  Tap water enema x 2 today.   Please increase Miralax to daily instead of every other day.

## 2014-09-14 NOTE — Progress Notes (Signed)
     09/14/2014 [name] [id number] 12/08/1933   History of Present Illness:  Patient is a 81 year old female who is a resident at [facility] and is here today with a staff member from medical records that does not know that patient to relay any helpful information.  She is previously known to Dr. Leone PayorGessner for treatment of achalasia with Botox injection during EGD in 12/2013.  At that time stenosis was found at the GE junction, which is where the Botox was injected and she had some gastritis.  This seems to have helped her swallowing and even today she said that she ate all of her breakfast and always eats all of her meals.  Anyway, she was referred here on this occasion by Dr. Izola PriceMyers, from [facility], for evaluation of anemia and heme positive stools.  One month ago her Hgb was 14.3 grams, however, she went to the ED for low Hgb of 6.7 grams 2 weeks ago.  No over bleeding reported, but she was heme positive.  She was transfused and Hgb 12 days ago was 11.5 grams with another stable repeat of 11.7 grams just 6 days ago.  The patient complains of constipation, but says that she had 3 bowel movements this morning.  Unsure how reliable she is with any history.  It is listed that she receives dulcolax 5 mg every evening, Miralax every other day, and Senokot one tablet daily for her bowel regimen.   Current Medications, Allergies, Past Medical History, Past Surgical History, Family History and Social History were reviewed in [facility] electronic medical record.   Physical Exam: BP 118/62 mmHg  Pulse 68  Ht 5\' 3"  (1.6 m)  Wt 161 lb 2 oz (73.086 kg)  BMI 28.55 kg/m2 General:  Elderly and chronically ill-appearing white female in no acute distress; in wheelchair and repetitively yelling out and crying when not engaged in conversation. Head: Normocephalic and atraumatic Eyes:  Sclerae anicteric, conjunctiva pink  Ears: Normal auditory acuity Lungs: Clear throughout to  auscultation Heart: Regular rate and rhythm Abdomen: Soft, non-distended.  Bowel sounds.  Hard to assess tenderness due to patient's continuous yelling out, but abdominal exam is benign. Musculoskeletal: Symmetrical with no gross deformities  Extremities: No edema  Neurological: Alert oriented x 4, grossly non-focal Psychological:  Alert and cooperative. Normal mood and affect  Assessment and Recommendations: -Acute drop in Hgb with heme positive stools, but no overt bleeding:  Discussed with Dr. Leone PayorGessner.  [facility] and he will help arrange a date for that.  Hgb to be monitored by patient's PCP/living facility. -Constipation:  Unsure how reliable of a historian patient is.  Will have them give her two tap water enemas today and then increase Miralax to daily instead of every other day.  Will evaluate with colonoscopy.

## 2014-09-28 NOTE — Progress Notes (Signed)
Agree with Ms. Zehr's management.  Carl E. Gessner, MD, FACG  

## 2014-10-03 ENCOUNTER — Telehealth: Payer: Self-pay

## 2014-10-03 DIAGNOSIS — D649 Anemia, unspecified: Secondary | ICD-10-CM

## 2014-10-03 DIAGNOSIS — R195 Other fecal abnormalities: Secondary | ICD-10-CM

## 2014-10-03 NOTE — Telephone Encounter (Signed)
Silvano BilisLoy, Bettylou A - 09/14/14 >','<< Less Detail',event)" href="javascript:;">More Detail >>   needs colon at [facility] and a cbc    Iva Booparl E Gessner, MD    Sent: Thu September 28, 2014 10:50 PM    To: Annett FabianSheri L Jones, RN        Message     [facility] CBC at [facility]        Set up colonoscopy at hospital week in May re: heme + anemia    Let's have her admitted to observation night before for prep        Let me know dates please            ----- Message -----     From: Leta BaptistJessica D Zehr, PA-C     Sent: 09/14/2014  1:42 PM      To: Iva Booparl E Gessner, MD    Left message for patient's son to call back and discuss colonoscopy I spoke with Maia PlanBethel Ayodabo, LPN  at [facility] and gave  orders for CBC.  Order faxed also to [phone]

## 2014-10-03 NOTE — Telephone Encounter (Signed)
-----   Message from Iva Booparl E Gessner, MD sent at 10/02/2014  1:22 PM EDT ----- Regarding: [facility] This lady should have a colon[facility] (see Jess' note)  However - not sure she can permit due to possible dementia  Can we reach out to son/POA about this and see if he is ok with this and best approach could be to see me in office before May hosp week - schedule permitting though if son ok and patient ok could set up with obs admit first and no OV as I have concerns about [facility]  Thanks

## 2014-10-04 NOTE — Telephone Encounter (Signed)
Left message for patient to call back  

## 2014-10-09 NOTE — Telephone Encounter (Signed)
Left message for Waynetta PeanKathy Maddox to call back to call back to discuss procedures

## 2014-10-10 NOTE — Telephone Encounter (Signed)
I spoke with her nurse Bethel at [facility] the patient is not able to consent herself.  She advised me to call her daughter not the son.  I have left another message for her to call back and discuss.

## 2014-10-10 NOTE — Telephone Encounter (Signed)
It is not clear to me that she needs someone else to sign. I saw that Jess told her POA needed to be there but having a POA does not mean it has to be used if patient can give consent. Please check with [facility] staff to see what the situation is. We may need to have her come back in for a visit with me.  I will try to call POA after hours if we still need them Thanks

## 2014-10-10 NOTE — Telephone Encounter (Signed)
I contacted [facility] and spoke with Maia PlanBethel Ayodabo, LPN again.  I have asked that she fax a copy of the CBC results from last week.

## 2014-10-10 NOTE — Telephone Encounter (Signed)
Dr. Leone PayorGessner I have left numerous messages for the patient's son and daughter.  Neither have returned my call.  What should I do from here?  Do I just set up the procedures?  Not sure how we could consent.

## 2014-10-12 NOTE — Telephone Encounter (Signed)
I left another message for the patient's daughter to call back about setting up an appt for her mother

## 2014-10-13 NOTE — Telephone Encounter (Signed)
I spoke with the patient's daughter Lynden AngCathy.  She has been out of the [place] and not able to reply until today. She has agreed to allow her mom to have colon[facility] and be admitted the night before.  I have [facility] on 10/24/14 with colonoscopy planned for 10/25/14.  Lynden AngCathy lives in [place] and is not able to come to sign the consent form, but she will try and reach her brother and make sure he is available to sign the consent.  She will call me back on Monday after she speaks with him.  I have not placed the patient on the endo schedule or entered orders, she will be inpatient at the time and can't be scheduled at this time. I will send a reminder flag to Willette ClusterPaula Guenther RNP that the patient will be admitted and will need the case scheduled and orders placed.     I spoke with Maia PlanBethel Ayodabo, LPN at [facility] and notified her that the patient will need to be clear liquid diet after midnight on 10/24/14 for [facility] that afternoon and should return to the facility on 10/25/14 after the colonoscopy.

## 2014-10-17 NOTE — Telephone Encounter (Signed)
I spoke with the patient's daughter.  She nor her brother can be here when the patient can be admitted.  She will be available to do a verbal consent for the procedure when she is admitted.  She agrees to allow her mom to be admitted on 10/24/14 and colonoscopy on 10/25/14.

## 2014-10-24 ENCOUNTER — Encounter (HOSPITAL_COMMUNITY): Payer: Self-pay

## 2014-10-24 ENCOUNTER — Observation Stay (HOSPITAL_COMMUNITY)
Admission: RE | Admit: 2014-10-24 | Discharge: 2014-10-25 | Payer: Medicare Other | Source: Ambulatory Visit | Attending: Internal Medicine | Admitting: Internal Medicine

## 2014-10-24 DIAGNOSIS — Z881 Allergy status to other antibiotic agents status: Secondary | ICD-10-CM | POA: Insufficient documentation

## 2014-10-24 DIAGNOSIS — G43909 Migraine, unspecified, not intractable, without status migrainosus: Secondary | ICD-10-CM | POA: Diagnosis not present

## 2014-10-24 DIAGNOSIS — Z5309 Procedure and treatment not carried out because of other contraindication: Secondary | ICD-10-CM | POA: Insufficient documentation

## 2014-10-24 DIAGNOSIS — Z8701 Personal history of pneumonia (recurrent): Secondary | ICD-10-CM | POA: Insufficient documentation

## 2014-10-24 DIAGNOSIS — R1310 Dysphagia, unspecified: Secondary | ICD-10-CM

## 2014-10-24 DIAGNOSIS — Z888 Allergy status to other drugs, medicaments and biological substances status: Secondary | ICD-10-CM | POA: Diagnosis not present

## 2014-10-24 DIAGNOSIS — M810 Age-related osteoporosis without current pathological fracture: Secondary | ICD-10-CM | POA: Diagnosis not present

## 2014-10-24 DIAGNOSIS — D6489 Other specified anemias: Secondary | ICD-10-CM | POA: Diagnosis not present

## 2014-10-24 DIAGNOSIS — R195 Other fecal abnormalities: Secondary | ICD-10-CM | POA: Diagnosis present

## 2014-10-24 DIAGNOSIS — I714 Abdominal aortic aneurysm, without rupture, unspecified: Secondary | ICD-10-CM | POA: Diagnosis present

## 2014-10-24 DIAGNOSIS — Z90710 Acquired absence of both cervix and uterus: Secondary | ICD-10-CM | POA: Insufficient documentation

## 2014-10-24 DIAGNOSIS — Z9851 Tubal ligation status: Secondary | ICD-10-CM | POA: Diagnosis not present

## 2014-10-24 DIAGNOSIS — E785 Hyperlipidemia, unspecified: Secondary | ICD-10-CM | POA: Diagnosis not present

## 2014-10-24 DIAGNOSIS — Z7982 Long term (current) use of aspirin: Secondary | ICD-10-CM | POA: Diagnosis not present

## 2014-10-24 DIAGNOSIS — N189 Chronic kidney disease, unspecified: Secondary | ICD-10-CM | POA: Diagnosis not present

## 2014-10-24 DIAGNOSIS — R059 Cough, unspecified: Secondary | ICD-10-CM | POA: Insufficient documentation

## 2014-10-24 DIAGNOSIS — J690 Pneumonitis due to inhalation of food and vomit: Secondary | ICD-10-CM | POA: Diagnosis present

## 2014-10-24 DIAGNOSIS — Z8673 Personal history of transient ischemic attack (TIA), and cerebral infarction without residual deficits: Secondary | ICD-10-CM | POA: Insufficient documentation

## 2014-10-24 DIAGNOSIS — I129 Hypertensive chronic kidney disease with stage 1 through stage 4 chronic kidney disease, or unspecified chronic kidney disease: Secondary | ICD-10-CM | POA: Diagnosis not present

## 2014-10-24 DIAGNOSIS — Z880 Allergy status to penicillin: Secondary | ICD-10-CM | POA: Insufficient documentation

## 2014-10-24 DIAGNOSIS — Z905 Acquired absence of kidney: Secondary | ICD-10-CM | POA: Diagnosis not present

## 2014-10-24 DIAGNOSIS — D509 Iron deficiency anemia, unspecified: Secondary | ICD-10-CM | POA: Diagnosis not present

## 2014-10-24 DIAGNOSIS — Z87891 Personal history of nicotine dependence: Secondary | ICD-10-CM | POA: Diagnosis not present

## 2014-10-24 DIAGNOSIS — Z886 Allergy status to analgesic agent status: Secondary | ICD-10-CM | POA: Diagnosis not present

## 2014-10-24 DIAGNOSIS — K921 Melena: Secondary | ICD-10-CM | POA: Diagnosis present

## 2014-10-24 DIAGNOSIS — R05 Cough: Secondary | ICD-10-CM | POA: Insufficient documentation

## 2014-10-24 DIAGNOSIS — D649 Anemia, unspecified: Secondary | ICD-10-CM | POA: Diagnosis present

## 2014-10-24 DIAGNOSIS — J9601 Acute respiratory failure with hypoxia: Secondary | ICD-10-CM | POA: Insufficient documentation

## 2014-10-24 SURGERY — Surgical Case
Anesthesia: *Unknown

## 2014-10-24 MED ORDER — ONDANSETRON HCL 4 MG/2ML IJ SOLN: 4.0000 mg | Freq: Four times a day (QID) | INTRAMUSCULAR | Status: DC | PRN

## 2014-10-24 MED ORDER — PREGABALIN 75 MG PO CAPS
75.0000 mg | ORAL_CAPSULE | Freq: Three times a day (TID) | ORAL | Status: DC
Start: 2014-10-24 — End: 2014-10-25
  Administered 2014-10-24 – 2014-10-25 (×3): 75 mg via ORAL
  Filled 2014-10-24 (×3): qty 1

## 2014-10-24 MED ORDER — ESCITALOPRAM OXALATE 10 MG PO TABS
10.0000 mg | ORAL_TABLET | Freq: Every day | ORAL | Status: DC
  Administered 2014-10-24: 10 mg via ORAL
  Filled 2014-10-24 (×2): qty 1

## 2014-10-24 MED ORDER — PEG-KCL-NACL-NASULF-NA ASC-C 100 G PO SOLR
0.5000 | Freq: Once | ORAL | Status: AC
  Administered 2014-10-25: 100 g via ORAL
  Filled 2014-10-24: qty 1

## 2014-10-24 MED ORDER — PEG-KCL-NACL-NASULF-NA ASC-C 100 G PO SOLR
0.5000 | Freq: Once | ORAL | Status: AC
  Administered 2014-10-24: 100 g via ORAL
  Filled 2014-10-24 (×2): qty 1

## 2014-10-24 MED ORDER — ONDANSETRON HCL 4 MG PO TABS: 4.0000 mg | ORAL_TABLET | Freq: Four times a day (QID) | ORAL | Status: DC | PRN

## 2014-10-24 MED ORDER — SALINE SPRAY 0.65 % NA SOLN
1.0000 | Freq: Four times a day (QID) | NASAL | Status: DC
  Administered 2014-10-24 – 2014-10-25 (×4): 1 via NASAL
  Filled 2014-10-24: qty 44

## 2014-10-24 MED ORDER — HYDROXYZINE HCL 25 MG PO TABS: 25.0000 mg | ORAL_TABLET | Freq: Three times a day (TID) | ORAL | Status: DC | PRN

## 2014-10-24 MED ORDER — SODIUM CHLORIDE 0.9 % IV SOLN
INTRAVENOUS | Status: DC
  Administered 2014-10-24 – 2014-10-25 (×2): via INTRAVENOUS

## 2014-10-24 MED ORDER — AMLODIPINE BESYLATE 2.5 MG PO TABS
2.5000 mg | ORAL_TABLET | Freq: Every day | ORAL | Status: DC
  Administered 2014-10-24: 2.5 mg via ORAL
  Filled 2014-10-24 (×2): qty 1

## 2014-10-24 MED ORDER — HYDROCODONE-ACETAMINOPHEN 5-325 MG PO TABS
1.0000 | ORAL_TABLET | Freq: Four times a day (QID) | ORAL | Status: DC | PRN
  Administered 2014-10-24: 1 via ORAL
  Filled 2014-10-24: qty 1

## 2014-10-24 MED ORDER — PEG-KCL-NACL-NASULF-NA ASC-C 100 G PO SOLR: 1.0000 | Freq: Once | ORAL | Status: DC

## 2014-10-24 MED ORDER — IPRATROPIUM-ALBUTEROL 0.5-2.5 (3) MG/3ML IN SOLN
3.0000 mL | Freq: Four times a day (QID) | RESPIRATORY_TRACT | Status: DC | PRN
  Administered 2014-10-24: 3 mL via RESPIRATORY_TRACT
  Filled 2014-10-24: qty 3

## 2014-10-24 NOTE — H&P (Signed)
Admission Note  Primary Care Physician:  Mayra Neer, MD Primary Gastroenterologist:   Silvano Rusk,  MD  HPI: [name] is a 81 y.o. female known to Dr. Carlean Purl for a history of achalasia. She was most recently seen for anemia and heme positive stool. Her hemoglobin dropped from 14 to 6 in March. Patient was transfused, hgb rose to mid 11 range. Patient scheduled for colonoscopy for further evaluation. She resides at [facility]. Patient being admitted to observation to assist with bowel prep   Past Medical History  Diagnosis Date  . Abdominal aneurysm without mention of rupture   . Cholelithiasis   . Hyperlipidemia   . TIA (transient ischemic attack) ~ 09/2010    "[name]"  . Neuropathic pain   . Osteoporosis   . Arthritis   . Hypertension 04/30/11    "not a problem now"  . Pneumonia     "once or twice"  . Migraines   . Peripheral neuropathy     "hands, feet, legs"  . Stroke     hx of TIA  . Chronic kidney disease     NEPHRECTOMY  . Achalasia -suspected by hx/ba swallow 01/11/2014  . Gastritis   . Ischemic colitis   . Anemia     Past Surgical History  Procedure Laterality Date  . Carotid endarterectomy  03/1999; 04/1999    bilateral (Dr. Scot Dock)  . Nephrectomy  1954    "double left kidney out"  . Tonsillectomy      "when I was little"  . Abdominal hysterectomy  1984  . Tubal ligation  1975  . Dilation and curettage of uterus      "I've had a bunch of those"  . Colon surgery  2002    "8 inches of my colon taken out"  . Fracture surgery  02/2008    "femur; put rod & screws in"  . Fracture surgery  03/2008    "replaced screws in femur"  . Fracture surgery  04/2008    "replaced hardware S/P rebreak femur"  . Esophagogastroduodenoscopy N/A 01/11/2014    Procedure: ESOPHAGOGASTRODUODENOSCOPY (EGD);  Surgeon: Gatha Mayer, MD;  Location: [facility];  Service: Endoscopy;  Laterality: N/A;  . Botox injection N/A 01/11/2014   Procedure: BOTOX INJECTION;  Surgeon: Gatha Mayer, MD;  Location: [facility];  Service: Endoscopy;  Laterality: N/A;  . Carotid angiogram N/A 06/30/2011    Procedure: CAROTID ANGIOGRAM;  Surgeon: Angelia Mould, MD;  Location: [facility];  Service: Cardiovascular;  Laterality: N/A;    Prior to Admission medications   Medication Sig Start Date End Date Taking? Authorizing Provider  amLODipine (NORVASC) 2.5 MG tablet Take 2.5 mg by mouth daily.  07/20/12  Yes Historical Provider, MD  aspirin EC 325 MG tablet Take 325 mg by mouth every morning.   Yes Historical Provider, MD  Cranberry 475 MG CAPS Take 1 capsule by mouth 2 (two) times daily.   Yes Historical Provider, MD  CREON 24000 UNITS CPEP Take 2 capsules by mouth 3 (three) times daily before meals.  05/26/11  Yes Historical Provider, MD  escitalopram (LEXAPRO) 10 MG tablet Take 10 mg by mouth daily.   Yes Historical Provider, MD  ferrous sulfate 325 (65 FE) MG tablet Take 325 mg by mouth 2 (two) times daily with a meal.   Yes Historical Provider, MD  fish oil-omega-3 fatty acids 1000 MG capsule Take 2 g by mouth every morning.   Yes Historical  Provider, MD  HYDROcodone-acetaminophen (NORCO/VICODIN) 5-325 MG per tablet Take 1 tablet by mouth every 6 (six) hours as needed for moderate pain. 09/02/14  Yes Theodis Blaze, MD  hydrOXYzine (ATARAX/VISTARIL) 25 MG tablet Take 25 mg by mouth 3 (three) times daily as needed for itching.   Yes Historical Provider, MD  ipratropium-albuterol (DUONEB) 0.5-2.5 (3) MG/3ML SOLN Take 3 mLs by nebulization every 6 (six) hours as needed (for shortness of breath).    Yes Historical Provider, MD  Multiple Vitamin (MULTIVITAMIN) tablet Take 1 tablet by mouth daily.   Yes Historical Provider, MD  multivitamin-lutein (OCUVITE-LUTEIN) CAPS capsule Take 1 capsule by mouth daily.   Yes Historical Provider, MD  omeprazole (PRILOSEC) 20 MG capsule Take 20 mg by mouth daily.   Yes Historical Provider, MD    polyethylene glycol (MIRALAX / GLYCOLAX) packet Take 17 g by mouth every other day.    Yes Historical Provider, MD  pregabalin (LYRICA) 75 MG capsule Take 75 mg by mouth 3 (three) times daily.    Yes Historical Provider, MD  promethazine (PHENERGAN) 25 MG suppository Place 25 mg rectally every 8 (eight) hours as needed for nausea or vomiting.   Yes Historical Provider, MD  Propylene Glycol (SYSTANE BALANCE) 0.6 % SOLN Apply 2 drops to eye 3 (three) times daily.    Yes Historical Provider, MD  rosuvastatin (CRESTOR) 5 MG tablet Take 5 mg by mouth at bedtime.   Yes Historical Provider, MD  senna (SENOKOT) 8.6 MG tablet Take 1 tablet by mouth daily.   Yes Historical Provider, MD  sodium chloride (OCEAN) 0.65 % SOLN nasal spray Place 1 spray into both nostrils 4 (four) times daily.   Yes Historical Provider, MD  Vitamin D, Ergocalciferol, (DRISDOL) 50000 UNITS CAPS capsule Take 50,000 Units by mouth every 30 (thirty) days.   Yes Historical Provider, MD  bisacodyl (DULCOLAX) 5 MG EC tablet Take 5 mg by mouth every evening.    Historical Provider, MD    Current Facility-Administered Medications  Medication Dose Route Frequency Provider Last Rate Last Dose  . 0.9 %  sodium chloride infusion   Intravenous Continuous Willia Craze, NP      . amLODipine (NORVASC) tablet 2.5 mg  2.5 mg Oral Daily Willia Craze, NP      . escitalopram (LEXAPRO) tablet 10 mg  10 mg Oral Daily Willia Craze, NP      . HYDROcodone-acetaminophen (NORCO/VICODIN) 5-325 MG per tablet 1 tablet  1 tablet Oral Q6H PRN Willia Craze, NP      . hydrOXYzine (ATARAX/VISTARIL) tablet 25 mg  25 mg Oral TID PRN Willia Craze, NP      . ipratropium-albuterol (DUONEB) 0.5-2.5 (3) MG/3ML nebulizer solution 3 mL  3 mL Nebulization Q6H PRN Willia Craze, NP      . ondansetron [facility]) tablet 4 mg  4 mg Oral Q6H PRN Willia Craze, NP       Or  . ondansetron [facility]) injection 4 mg  4 mg Intravenous Q6H PRN Willia Craze,  NP      . peg 3350 powder (MOVIPREP) kit 100 g  0.5 kit Oral Once Gatha Mayer, MD       And  . Derrill Memo ON 10/25/2014] peg 3350 powder (MOVIPREP) kit 100 g  0.5 kit Oral Once Gatha Mayer, MD      . pregabalin (LYRICA) capsule 75 mg  75 mg Oral TID Willia Craze, NP      .  sodium chloride (OCEAN) 0.65 % nasal spray 1 spray  1 spray Each Nare QID Willia Craze, NP        Allergies as of 10/13/2014 - Review Complete 09/14/2014  Allergen Reaction Noted  . Aminoglycosides Other (See Comments) 03/20/2014  . Codeine Rash 09/26/2010  . Erythromycin Other (See Comments) 03/20/2014  . Macrolides and ketolides Other (See Comments) 03/20/2014  . Penicillins Rash 09/26/2010  . Vancomycin Other (See Comments) 03/20/2014    Family History  Problem Relation Age of Onset  . Diabetes Mother   . Heart disease Mother   . Hypertension Mother   . Hyperlipidemia Mother   . Aneurysm Father   . Diabetes Brother     History   Social History  . Marital Status: Single    Spouse Name: N/A  . Number of Children: 2  . Years of Education: N/A   Occupational History  . Not on file.   Social History Main Topics  . Smoking status: Former Smoker -- 0.25 packs/day for 60 years    Types: Cigarettes    Start date: 06/17/2011  . Smokeless tobacco: Never Used  . Alcohol Use: No  . Drug Use: No     Comment: "I'm trying hard to quit smoking cigarettes"  . Sexual Activity: No   Other Topics Concern  . Not on file   Social History Narrative    Review of Systems: Positive for right should rash with itching. All systems reviewed and negative except where noted in HPI.   Physical Exam: Vital signs in last 24 hours:    temp 97.7, BP 104/66, HR 64, 02 98%  General:  Pleasant, well-developed, white female in NAD Head:  Normocephalic and atraumatic. Eyes:  Sclera clear, no icterus.   Conjunctiva pink. Ears:  Normal auditory acuity.  Neck:  Supple; no masses . Lungs:  Clear throughout to  auscultation.   Occasional expiratory wheeze at bilateral bases. Heart:  Regular rate and rhythm Abdomen:  Soft, nondistended, nontender. No masses, hepatomegaly. No obvious masses.  Normal bowel .    Msk:  Symmetrical without gross deformities.. Extremities:  Without edema. Neurologic:  Alert and  oriented x4;  grossly normal neurologically. Skin:  Intact . Multiple ecchymotic areas on both arms. Localized rash right shoulder.  Cervical Nodes:  No significant cervical adenopathy. Psych:  Alert and cooperative. Normal mood and affect.  Lab Results: No results for input(s): WBC, HGB, HCT, PLT in the last 72 hours. BMET No results for input(s): NA, K, CL, CO2, GLUCOSE, BUN, CREATININE, CALCIUM in the last 72 hours. LFT No results for input(s): PROT, ALBUMIN, AST, ALT, ALKPHOS, BILITOT, BILIDIR, IBILI in the last 72 hours. PT/INR No results for input(s): LABPROT, INR in the last 72 hours. Hepatitis Panel No results for input(s): HEPBSAG, HCVAB, HEPAIGM, HEPBIGM in the last 72 hours.  Impression / Plan:   Pleasant 81 year old female with anemia and heme positive stools. Hgb dropped from 11 to 6 last month requiring transfusion. For further evaluation patient will be scheduled for colonoscopy. Left message for daughter [name] [place] to call nurse. We need to get verbal consent. Bowel prep to start at 6pm. Continue only essential home meds.    [name]  10/24/2014, 4:11 PM

## 2014-10-24 NOTE — Progress Notes (Signed)
Informed consent obtained from pt's dasughter , [name], verified by two RNs. She stated understanding of the reason for the procedure and attendant risks, and agrees to proceed.

## 2014-10-25 ENCOUNTER — Observation Stay (HOSPITAL_COMMUNITY): Payer: Medicare Other

## 2014-10-25 ENCOUNTER — Encounter (HOSPITAL_COMMUNITY)
Admission: RE | Disposition: A | Discharge status: Other Acute Inpatient Hospital | Payer: Self-pay | Source: Ambulatory Visit | Attending: Internal Medicine

## 2014-10-25 ENCOUNTER — Encounter (HOSPITAL_COMMUNITY): Payer: Self-pay | Admitting: Gastroenterology

## 2014-10-25 ENCOUNTER — Ambulatory Visit (HOSPITAL_COMMUNITY): Admission: RE | Admit: 2014-10-25 | Payer: Medicare Other | Source: Ambulatory Visit | Admitting: Internal Medicine

## 2014-10-25 DIAGNOSIS — R05 Cough: Secondary | ICD-10-CM | POA: Insufficient documentation

## 2014-10-25 DIAGNOSIS — Z8673 Personal history of transient ischemic attack (TIA), and cerebral infarction without residual deficits: Secondary | ICD-10-CM

## 2014-10-25 DIAGNOSIS — R195 Other fecal abnormalities: Secondary | ICD-10-CM | POA: Diagnosis not present

## 2014-10-25 DIAGNOSIS — J690 Pneumonitis due to inhalation of food and vomit: Secondary | ICD-10-CM

## 2014-10-25 DIAGNOSIS — D509 Iron deficiency anemia, unspecified: Secondary | ICD-10-CM | POA: Diagnosis not present

## 2014-10-25 DIAGNOSIS — I714 Abdominal aortic aneurysm, without rupture: Secondary | ICD-10-CM

## 2014-10-25 DIAGNOSIS — J9601 Acute respiratory failure with hypoxia: Secondary | ICD-10-CM | POA: Diagnosis not present

## 2014-10-25 DIAGNOSIS — R1310 Dysphagia, unspecified: Secondary | ICD-10-CM

## 2014-10-25 DIAGNOSIS — D6489 Other specified anemias: Secondary | ICD-10-CM | POA: Diagnosis not present

## 2014-10-25 DIAGNOSIS — R059 Cough, unspecified: Secondary | ICD-10-CM | POA: Insufficient documentation

## 2014-10-25 HISTORY — PX: COLONOSCOPY: SHX5424

## 2014-10-25 LAB — MRSA PCR SCREENING: MRSA by PCR: POSITIVE — AB

## 2014-10-25 SURGERY — COLONOSCOPY
Anesthesia: Moderate Sedation

## 2014-10-25 MED ORDER — CEFPODOXIME PROXETIL 200 MG PO TABS: 200.0000 mg | ORAL_TABLET | Freq: Two times a day (BID) | ORAL | Status: AC

## 2014-10-25 MED ORDER — AMPICILLIN-SULBACTAM SODIUM 1.5 (1-0.5) G IJ SOLR
1.5000 g | Freq: Four times a day (QID) | INTRAMUSCULAR | Status: DC
  Administered 2014-10-25: 1.5 g via INTRAVENOUS
  Filled 2014-10-25: qty 1.5

## 2014-10-25 MED ORDER — MUPIROCIN 2 % EX OINT
1.0000 "application " | TOPICAL_OINTMENT | Freq: Two times a day (BID) | CUTANEOUS | Status: DC
  Administered 2014-10-25: 1 via NASAL
  Filled 2014-10-25: qty 22

## 2014-10-25 MED ORDER — CEFPODOXIME PROXETIL 200 MG PO TABS
200.0000 mg | ORAL_TABLET | Freq: Two times a day (BID) | ORAL | Status: DC
  Administered 2014-10-25: 200 mg via ORAL
  Filled 2014-10-25: qty 1

## 2014-10-25 MED ORDER — METRONIDAZOLE 500 MG PO TABS
500.0000 mg | ORAL_TABLET | Freq: Three times a day (TID) | ORAL | Status: DC
  Administered 2014-10-25: 500 mg via ORAL
  Filled 2014-10-25 (×2): qty 1

## 2014-10-25 MED ORDER — MUPIROCIN 2 % EX OINT: 1.0000 "application " | TOPICAL_OINTMENT | Freq: Two times a day (BID) | CUTANEOUS | Status: AC

## 2014-10-25 MED ORDER — CHLORHEXIDINE GLUCONATE CLOTH 2 % EX PADS
6.0000 | MEDICATED_PAD | Freq: Every day | CUTANEOUS | Status: DC
  Administered 2014-10-25: 6 via TOPICAL

## 2014-10-25 MED ORDER — METRONIDAZOLE 500 MG PO TABS: 500.0000 mg | ORAL_TABLET | Freq: Three times a day (TID) | ORAL | Status: AC

## 2014-10-25 NOTE — Discharge Summary (Signed)
   .   Addendum to Discharge Summary 10/25/14  Discharge medications (updated)  amLODipine 2.5 MG tablet  Commonly known as: NORVASC  Take 2.5 mg by mouth daily.     aspirin EC 325 MG tablet  Take 325 mg by mouth every morning.    bisacodyl 5 MG EC tablet  Commonly known as: DULCOLAX  Take 5 mg by mouth every evening.    Cranberry 475 MG Caps  Take 1 capsule by mouth 2 (two) times daily.    CREON 24000 UNITS Cpep  Generic drug: Pancrelipase (Lip-Prot-Amyl)  Take 2 capsules by mouth 3 (three) times daily before meals.    escitalopram 10 MG tablet  Commonly known as: LEXAPRO  Take 10 mg by mouth daily.    ferrous sulfate 325 (65 FE) MG tablet  Take 325 mg by mouth 2 (two) times daily with a meal.    fish oil-omega-3 fatty acids 1000 MG capsule  Take 2 g by mouth every morning.    HYDROcodone-acetaminophen 5-325 MG per tablet  Commonly known as: NORCO/VICODIN  Take 1 tablet by mouth every 6 (six) hours as needed for moderate pain.    hydrOXYzine 25 MG tablet  Commonly known as: ATARAX/VISTARIL  Take 25 mg by mouth 3 (three) times daily as needed for itching.    ipratropium-albuterol 0.5-2.5 (3) MG/3ML Soln  Commonly known as: DUONEB  Take 3 mLs by nebulization every 6 (six) hours as needed (for shortness of breath).    multivitamin tablet  Take 1 tablet by mouth daily.    multivitamin-lutein Caps capsule  Take 1 capsule by mouth daily.    mupirocin ointment 2 %  Commonly known as: BACTROBAN  Place 1 application into the nose 2 (two) times daily.    omeprazole 20 MG capsule  Commonly known as: PRILOSEC  Take 20 mg by mouth daily.    polyethylene glycol packet  Commonly known as: MIRALAX / GLYCOLAX  Take 17 g by mouth every other day.    pregabalin 75 MG capsule  Commonly known as: LYRICA  Take 75 mg by mouth 3 (three) times daily.    promethazine 25 MG suppository  Commonly  known as: PHENERGAN  Place 25 mg rectally every 8 (eight) hours as needed for nausea or vomiting.    rosuvastatin 5 MG tablet  Commonly known as: CRESTOR  Take 5 mg by mouth at bedtime.    senna 8.6 MG tablet  Commonly known as: SENOKOT  Take 1 tablet by mouth daily.    sodium chloride 0.65 % Soln nasal spray  Commonly known as: OCEAN  Place 1 spray into both nostrils 4 (four) times daily.    SYSTANE BALANCE 0.6 % Soln  Generic drug: Propylene Glycol  Apply 2 drops to eye 3 (three) times daily.    Vitamin D (Ergocalciferol) 50000 UNITS Caps capsule  Commonly known as: DRISDOL  Take 50,000 Units by mouth every 30 (thirty) days.   Vantin 200mg   Take one PO BID x 7 days  Flagyl 500mg  (Metronidazole) Take one PO TID x 7 days

## 2014-10-25 NOTE — Progress Notes (Signed)
No sedation given.

## 2014-10-25 NOTE — Progress Notes (Signed)
Tap water enema done with watery green output, Dr Leone PayorGessner aware, will proceed with colonoscopy

## 2014-10-25 NOTE — Progress Notes (Signed)
Report given to [name] PaddockBethel Ayobabo, [facility],also reminded the nurse to monitor for patient's worsening respiratory status- s/s of aspiration,fever,coughing episodes,oxygen level per MD's recommendation and to go to ED if condition worsens,nurse verbalized understanding.

## 2014-10-25 NOTE — Progress Notes (Signed)
Patient had first  dose of Unasyn per order,no reactions noted,no rash.. I spoke with the patient's daughterLynden [name]( Cathy) earlier and said mother does not have reactions to Penicillin with her previous hospitalization. Will continue to monitor.

## 2014-10-25 NOTE — Consult Note (Signed)
PCP:   Lupita Raider, MD   Chief Complaint:  hypoxia  HPI: 81 yo female was being prepped last night for colonscopy reportedly aspirated some of her bowel prep.  Is on 3 Liters [place] oxygen at snf prn basis.  She has h/o cva and dysphagia.  Oxygen sats or mid 70s on RA.  Other vitals are stable.  cxr shows new infiltrate.  Pt has no complaints.  Asked to consult for choice of abx for aspiration by GI team due to her multiple allergies.  Review of Systems:  Positive and negative as per HPI otherwise all other systems are negative  Past Medical History: Past Medical History  Diagnosis Date  . Abdominal aneurysm without mention of rupture   . Cholelithiasis   . Hyperlipidemia   . TIA (transient ischemic attack) ~ 09/2010    "[name]"  . Neuropathic pain   . Osteoporosis   . Arthritis   . Hypertension 04/30/11    "not a problem now"  . Pneumonia     "once or twice"  . Migraines   . Headache(784.0)   . Peripheral neuropathy     "hands, feet, legs"  . Stroke     hx of TIA  . Chronic kidney disease     NEPHRECTOMY  . Achalasia -suspected by hx/ba swallow 01/11/2014  . Gastritis   . Ischemic colitis   . Anemia   . GI bleed    Past Surgical History  Procedure Laterality Date  . Carotid endarterectomy  03/1999; 04/1999    bilateral (Dr. Edilia Bo)  . Nephrectomy  1954    "double left kidney out"  . Tonsillectomy      "when I was little"  . Abdominal hysterectomy  1984  . Tubal ligation  1975  . Dilation and curettage of uterus      "I've had a bunch of those"  . Colon surgery  2002    "8 inches of my colon taken out"  . Fracture surgery  02/2008    "femur; put rod & screws in"  . Fracture surgery  03/2008    "replaced screws in femur"  . Fracture surgery  04/2008    "replaced hardware S/P rebreak femur"  . Esophagogastroduodenoscopy N/A 01/11/2014    Procedure: ESOPHAGOGASTRODUODENOSCOPY (EGD);  Surgeon: Iva Boop, MD;  Location: [facility];  Service: Endoscopy;  Laterality: N/A;  . Botox injection N/A 01/11/2014    Procedure: BOTOX INJECTION;  Surgeon: Iva Boop, MD;  Location: [facility];  Service: Endoscopy;  Laterality: N/A;  . Carotid angiogram N/A 06/30/2011    Procedure: CAROTID ANGIOGRAM;  Surgeon: Chuck Hint, MD;  Location: [facility] CATH LAB;  Service: Cardiovascular;  Laterality: N/A;    Medications: Prior to Admission medications   Medication Sig Start Date End Date Taking? Authorizing Provider  amLODipine (NORVASC) 2.5 MG tablet Take 2.5 mg by mouth daily.  07/20/12  Yes Historical Provider, MD  aspirin EC 325 MG tablet Take 325 mg by mouth every morning.   Yes Historical Provider, MD  Cranberry 475 MG CAPS Take 1 capsule by mouth 2 (two) times daily.   Yes Historical Provider, MD  CREON 24000 UNITS CPEP Take 2 capsules by mouth 3 (three) times daily before meals.  05/26/11  Yes Historical Provider, MD  escitalopram (LEXAPRO) 10 MG tablet Take 10 mg by mouth daily.   Yes Historical Provider, MD  ferrous sulfate 325 (65 FE) MG tablet Take 325 mg by mouth  2 (two) times daily with a meal.   Yes Historical Provider, MD  fish oil-omega-3 fatty acids 1000 MG capsule Take 2 g by mouth every morning.   Yes Historical Provider, MD  HYDROcodone-acetaminophen (NORCO/VICODIN) 5-325 MG per tablet Take 1 tablet by mouth every 6 (six) hours as needed for moderate pain. 09/02/14  Yes Dorothea OgleIskra M Myers, MD  hydrOXYzine (ATARAX/VISTARIL) 25 MG tablet Take 25 mg by mouth 3 (three) times daily as needed for itching.   Yes Historical Provider, MD  ipratropium-albuterol (DUONEB) 0.5-2.5 (3) MG/3ML SOLN Take 3 mLs by nebulization every 6 (six) hours as needed (for shortness of breath).    Yes Historical Provider, MD  Multiple Vitamin (MULTIVITAMIN) tablet Take 1 tablet by mouth daily.   Yes Historical Provider, MD  multivitamin-lutein (OCUVITE-LUTEIN) CAPS capsule Take 1 capsule by mouth daily.   Yes Historical Provider, MD   omeprazole (PRILOSEC) 20 MG capsule Take 20 mg by mouth daily.   Yes Historical Provider, MD  polyethylene glycol (MIRALAX / GLYCOLAX) packet Take 17 g by mouth every other day.    Yes Historical Provider, MD  pregabalin (LYRICA) 75 MG capsule Take 75 mg by mouth 3 (three) times daily.    Yes Historical Provider, MD  promethazine (PHENERGAN) 25 MG suppository Place 25 mg rectally every 8 (eight) hours as needed for nausea or vomiting.   Yes Historical Provider, MD  Propylene Glycol (SYSTANE BALANCE) 0.6 % SOLN Apply 2 drops to eye 3 (three) times daily.    Yes Historical Provider, MD  rosuvastatin (CRESTOR) 5 MG tablet Take 5 mg by mouth at bedtime.   Yes Historical Provider, MD  senna (SENOKOT) 8.6 MG tablet Take 1 tablet by mouth daily.   Yes Historical Provider, MD  sodium chloride (OCEAN) 0.65 % SOLN nasal spray Place 1 spray into both nostrils 4 (four) times daily.   Yes Historical Provider, MD  Vitamin D, Ergocalciferol, (DRISDOL) 50000 UNITS CAPS capsule Take 50,000 Units by mouth every 30 (thirty) days.   Yes Historical Provider, MD  bisacodyl (DULCOLAX) 5 MG EC tablet Take 5 mg by mouth every evening.    Historical Provider, MD  mupirocin ointment (BACTROBAN) 2 % Place 1 application into the nose 2 (two) times daily. 10/25/14 11/01/14  Meredith PelPaula M Guenther, NP    Allergies:   Allergies  Allergen Reactions  . Aminoglycosides Other (See Comments)    unknown  . Codeine Rash  . Erythromycin Other (See Comments)    unknown  . Macrolides And Ketolides Other (See Comments)    unknown  . Penicillins Rash  . Vancomycin Other (See Comments)    unknown    Social History:  reports that she has quit smoking. Her smoking use included Cigarettes. She started smoking about 3 years ago. She has a 15 pack-year smoking history. She has never used smokeless tobacco. She reports that she does not drink alcohol or use illicit drugs.  Family History: Family History  Problem Relation Age of Onset  .  Diabetes Mother   . Heart disease Mother   . Hypertension Mother   . Hyperlipidemia Mother   . Aneurysm Father   . Diabetes Brother     Physical Exam: Filed Vitals:   10/24/14 2057 10/25/14 0644 10/25/14 0839 10/25/14 1433  BP:  151/80  140/65  Pulse:  91 67 76  Temp:  98.1 F (36.7 C) 97.8 F (36.6 C) 98.5 F (36.9 C)  TempSrc:  Oral Oral Oral  Resp:  22  20  Height:      Weight:      SpO2: 100% 97%  95%   General appearance: alert, cooperative and no distress Head: Normocephalic, without obvious abnormality, atraumatic Eyes: conjunctivae/corneas clear. PERRL, EOM's intact. Fundi benign. Nose: Nares normal. Septum midline. Mucosa normal. No drainage or sinus tenderness. Neck: no JVD and supple, symmetrical, trachea midline Lungs: clear to auscultation bilaterally Heart: regular rate and rhythm, S1, S2 normal, no murmur, click, rub or gallop Abdomen: soft, non-tender; bowel sounds normal; no masses,  no organomegaly Extremities: extremities normal, atraumatic, no cyanosis or edema Pulses: 2+ and symmetric Skin: Skin color, texture, turgor normal. No rashes or lesions Neurologic: Grossly normal    Labs on Admission:   none   Radiological Exams on Admission: Dg Chest 2 View  10/25/2014   CLINICAL DATA:  81 year old female with a history of productive cough and shortness of breath.  EXAM: CHEST - 2 VIEW  COMPARISON:  Plain film 07/27/2014, 11/19/2011. Prior chest CT 11/19/2011  FINDINGS: Cardiomediastinal silhouette unchanged from prior. Lateral view demonstrates left atrial enlargement. Atherosclerotic calcifications of the aortic arch. New  No evidence of pulmonary vascular congestion.  Interval development of ill-defined interstitial and airspace opacity at the right base medially, overlying the right heart border.  Lateral view demonstrates increased opacity overlying the spine.  No pneumothorax.  Osteopenia.  No displaced fracture identified.  IMPRESSION: Interval  development of right basilar interstitial and airspace opacities, potentially pneumonia or aspiration pneumonitis/ pneumonia. Background of chronic lung changes.  Atherosclerosis.  Signed,  Yvone NeuJaime S. Loreta AveWagner, DO  [facility]  [facility]   Electronically Signed   By: Gilmer MorJaime  Wagner D.O.   On: 10/25/2014 13:43    Assessment/Plan  81 yo female with report of aspiration during bowel prep last night now with acute hypoxic respiratory failure  Principal Problem:   Acute respiratory failure with hypoxia- Start vantin 200mg  po bid along with flagyl 500mg  tid po.  Would expect that patient has high probability of respiratory status worsening over the next 24 to 48 hours due to degree of hypoxia in setting of aspiration.   supp o2 at snf to keep o2 sats above 88%.  Would recommend further hospital stay, however, family wishes for her to go back to SNF at this time.  Would advise to return to ED with any worsening respiratory status.  Active Problems:   History of transient ischemic attack (TIA)   Abdominal aortic aneurysm   Dysphagia   Anemia   Heme + stool   Absolute anemia   Aspiration pneumonia  Consult time 65 total time speaking to patient, RN, GI service, and pharm D to review allergies, and previous antibiotic usage.   DAVID,RACHAL A 10/25/2014, 4:53 PM

## 2014-10-25 NOTE — Discharge Summary (Signed)
[facility] Discharge Summary  Name: [name] MRN: [id number] DOB: 02/29/1934 81 y.o. PCP:  Lupita RaiderSHAW,KIMBERLEE, MD  Date of Admission: 10/24/2014  3:11 PM Date of Discharge: 10/25/2014 Primary Gastroenterologist: Stan Headarl Gessner, MD Discharging Physician: Stan Headarl Gessner, MD  Discharge Diagnosis: 1. Microcytic anemia, heme positive stool. Etiology unclear. Anemia improved after March transfusion.   2. Probable aspiration pna (bowel prep).    3. Multiple medical problems, no exacerbation of chronic conditions this observational stay.   Consultations: [facility]   Procedures Performed:  none  GI Procedures: Flexible sigmoidoscopy - procedure aborted. Solid stool encountered in sigmoid  History/Physical Exam:  See Admission H&P  Admission HPI: [name] is a 81 y.o. female known to Dr. Leone PayorGessner for a history of achalasia. She was most recently seen for anemia and heme positive stool. Her hemoglobin dropped from 14 to 6 in March. Patient was transfused, hgb rose to mid 11 range. Patient scheduled for colonoscopy for further evaluation. [facility]. Patient being admitted to observation to assist with bowel prep.   Hospital Course by problem list: 1.  Microcytic anemia, heme positive stool. [name] for colonoscopy. Patient unable to tolerate bowel prep. Solid stool encountered in sigmoid and also there was concern that patient aspirated so procedure aborted.  After discussion with daughter [name], no plans to reattempt colonoscopy given advanced age and frail condition. Hgb was up to 11.5 09/02/14. Continue oral iron replacement.   2. Probable aspiration of bowel prep. She was coughing during bowel prep last night. CXR today shows interval development of right basilar interstitial and airspac opacities, potentially pneumonia or aspiration pneumonitis / pneumonia. She desaturates off oxygen (80's)  . No wheezing, just decreased breath sounds at bilateral bases. Originally planned to start antibiotics and observe overnight but I have spoken with daughter [name] on phone several times today. She wants mother to remain hospitalized if absolutely necessary but If patient stays beyond "observational stay" she may lose bed at [name] unless family pays out of pocket for it. [name] can provide oxygen therapy and IV antibiotics but patient could still deteriorate in the next couple of days. Patient has multiple antibiotic allergies, finding antibiotic regimen was challenging. She will be discharged on Vantin and Flagyl. If SOB, cough, and /or fevers she will need to be transported to ED.   3. MRSA (nasal swab). Continue Mucpirocin ointment for 7 days.   4. Multiple medical problems, stable.   Discharge Vitals:  BP 151/80 mmHg  Pulse 67  Temp(Src) 97.8 F (36.6 C) (Oral)  Resp 22  Ht 5\' 6"  (1.676 m)  Wt 151 lb (68.493 kg)  BMI 24.38 kg/m2  SpO2 97%  Discharge Labs:  Results for orders placed or performed during the hospital encounter of 10/24/14 (from the past 24 hour(s))  MRSA PCR Screening     Status: Abnormal   Collection Time: 10/24/14  8:41 PM  Result Value Ref Range   MRSA by PCR POSITIVE (A) NEGATIVE    Disposition and follow-up:   [name] Follow-up Appointments: follow up as needed  Discharge Medications:   Medication List    TAKE these medications        amLODipine 2.5 MG tablet  Commonly known as:  NORVASC  Take 2.5 mg by mouth daily.     aspirin EC 325 MG tablet  Take 325 mg by mouth every morning.     bisacodyl 5 MG EC tablet  Commonly known as:  DULCOLAX  Take 5 mg by mouth every evening.     Cranberry 475 MG Caps  Take 1 capsule by mouth 2 (two) times daily.     CREON 24000 UNITS Cpep  Generic drug:  Pancrelipase (Lip-Prot-Amyl)  Take 2 capsules by mouth 3 (three) times daily before meals.     escitalopram 10 MG tablet  Commonly known  as:  LEXAPRO  Take 10 mg by mouth daily.     ferrous sulfate 325 (65 FE) MG tablet  Take 325 mg by mouth 2 (two) times daily with a meal.     fish oil-omega-3 fatty acids 1000 MG capsule  Take 2 g by mouth every morning.     HYDROcodone-acetaminophen 5-325 MG per tablet  Commonly known as:  NORCO/VICODIN  Take 1 tablet by mouth every 6 (six) hours as needed for moderate pain.     hydrOXYzine 25 MG tablet  Commonly known as:  ATARAX/VISTARIL  Take 25 mg by mouth 3 (three) times daily as needed for itching.     ipratropium-albuterol 0.5-2.5 (3) MG/3ML Soln  Commonly known as:  DUONEB  Take 3 mLs by nebulization every 6 (six) hours as needed (for shortness of breath).     multivitamin tablet  Take 1 tablet by mouth daily.     multivitamin-lutein Caps capsule  Take 1 capsule by mouth daily.     mupirocin ointment 2 %  Commonly known as:  BACTROBAN  Place 1 application into the nose 2 (two) times daily.     omeprazole 20 MG capsule  Commonly known as:  PRILOSEC  Take 20 mg by mouth daily.     polyethylene glycol packet  Commonly known as:  MIRALAX / GLYCOLAX  Take 17 g by mouth every other day.     pregabalin 75 MG capsule  Commonly known as:  LYRICA  Take 75 mg by mouth 3 (three) times daily.     promethazine 25 MG suppository  Commonly known as:  PHENERGAN  Place 25 mg rectally every 8 (eight) hours as needed for nausea or vomiting.     rosuvastatin 5 MG tablet  Commonly known as:  CRESTOR  Take 5 mg by mouth at bedtime.     senna 8.6 MG tablet  Commonly known as:  SENOKOT  Take 1 tablet by mouth daily.     sodium chloride 0.65 % Soln nasal spray  Commonly known as:  OCEAN  Place 1 spray into both nostrils 4 (four) times daily.     SYSTANE BALANCE 0.6 % Soln  Generic drug:  Propylene Glycol  Apply 2 drops to eye 3 (three) times daily.     Vitamin D (Ergocalciferol) 50000 UNITS Caps capsule  Commonly known as:  DRISDOL  Take 50,000 Units by mouth every 30  (thirty) days.       Oxygen 3 liter per [place].  May wean down as long as 02 sats >88%     Signed: Willette Clusteraula Sabriyah Wilcher 10/25/2014, 11:57 AM

## 2014-10-25 NOTE — Progress Notes (Signed)
Pt unable to tolerate bowel prep. Attempted to feed drink and pt had coughing spells with each attempt. On call provider notified. Per verbal telephone call back bowel prep stopped and will re-evaluate in the morning. Will continue to monitor patient.

## 2014-10-25 NOTE — Progress Notes (Signed)
This Clinical research associatewriter left a message to Rockwell AutomationPaula [name] and asked her to complete the d/c summary, so that SW can be able to proceed with paper works.Awaiting for her call.I also notified SW regarding this matter.

## 2014-10-25 NOTE — Progress Notes (Signed)
NT3 Jessica removed patient's IV. Endorsed to night nurse Melvina that patient will be d/c to [name] tonight.

## 2014-10-25 NOTE — Clinical Social Work Note (Signed)
Clinical Social Work Assessment  Patient Details  Name: [name] MRN: [id number] Date of Birth: 11/27/1933  Date of referral:  10/25/14               Reason for consult:  Discharge Planning                Permission sought to share information with:  Family Supports, Magazine features editoracility Contact Representative Permission granted to share information::  Yes, Verbal Permission Granted  Name::     [name]  Agency::  [facility]  Relationship::  daughter  Contact Information:  [phone]  Housing/Transportation Living arrangements for the past 2 months:  Skilled Nursing Facility Source of Information:  Adult Children Patient Interpreter Needed:  None Criminal Activity/Legal Involvement Pertinent to Current Situation/Hospitalization:  No - Comment as needed Significant Relationships:  Adult Children Lives with:  Facility Resident Do you feel safe going back to the place where you live?  Yes Need for family participation in patient care:  Yes (Comment) (pt oriented to self only)  Care giving concerns:  [facility]   Social Worker assessment / plan: CSW received referral that [facility]. Per chart review, pt is a long term resident at the facility.  CSW visited pt room. Pt sleeping soundly in hospital bed. CSW reviewed chart and noted that pt oriented to self only. No family present at bedside. Per chart, pt observation only and MD note states that pending CXR will determine if pt can return [facility] vs remain in hospital.  CSW contacted pt daughter, [name] KiefCathy [name] via telephone. CSW introduced self and explained role. Pt daughter confirmed that [facility] Pt daughter confirmed plan for pt to return to [facility] when medically ready. Pt daughter aware that pt may potentially return [facility] depending on results of CXR.   [facility]. CSW updated facility and facility confirmed that they could accept pt today or whenever medically ready. Per [facility], facility does not need FL2 if discharge today as pt has not been in hospital 24 hours.   CSW to await update from MD regarding if pt stable for discharge today.  CSW to continue to follow to provide support and assist with [facility] return [facility] medically ready.   Employment status:  Retired Health and safety inspectornsurance information:  [place], [employer] PT Recommendations:  Not assessed at this time Information / Referral to community resources:  Other (Comment Required) ([facility])  Patient/Family's Response to care:  Pt oriented to self only. Pt daughter agreeable to plan for [facility]. Pt daughter aware of the plan of care and potential for return [facility].  Patient/Family's Understanding of and Emotional Response to Diagnosis, Current Treatment, and Prognosis:  Pt daughter coping appropriately with current diagnosis and treatment plan. Pt daughter feels comfortable with plan for [facility] if MD feels pt ready for discharge today.   Emotional Assessment Appearance:  Appears stated age Attitude/Demeanor/Rapport:  Unable to Assess (pt oriented to self only; sleeping at time of visit) Affect (typically observed):  Unable to Assess (oriented to self) Orientation:  Oriented to Self Alcohol / Substance use:  Not Applicable Psych involvement (Current and /or in the community):  No (Comment)  Discharge Needs  Concerns to be addressed:  Discharge Planning Concerns Readmission within  the last 30 days:  No Current discharge risk:  None Barriers to Discharge:  Continued Medical Work up   Orson EvaKIDD, Viana Sleep A, LCSW 10/25/2014, 10:28 AM  [phone]

## 2014-10-25 NOTE — Progress Notes (Signed)
Tap water enema performed as ordered,tolerated 700 cc of tap water. Noted brown watery stool after the procedure with bits of corn. Will continue to monitor.

## 2014-10-25 NOTE — Progress Notes (Signed)
Results of 2 View chest x-ray called in to Dr. Leone PayorGessner.

## 2014-10-25 NOTE — Progress Notes (Signed)
   Discussed with daughter Will not try to complete a colonoscopy  Check CXR today  She is on O2 prn per daughter  Await CXR and reassessment re: release from obs or stay  Iva Booparl E. Masaye Gatchalian, MD, [facility] [phone] (pager) 10/25/2014 10:13 AM

## 2014-10-25 NOTE — Plan of Care (Signed)
Problem: Phase I Progression Outcomes Goal: Voiding-avoid urinary catheter unless indicated Outcome: Progressing Patient is incontinent of urine.

## 2014-10-25 NOTE — Progress Notes (Signed)
Critical lab value patient positive for MRSA. Precaution initiated.

## 2014-10-25 NOTE — Progress Notes (Signed)
[facility].  CSW has confirmed with facility that they can continue to meet pt needs at facility.   CSW facilitated pt discharge needs including contacting facility, faxing pt discharge information to facility, discussing with pt at bedside and pt daughter, Lynden AngCathy via telephone, providing RN phone number to call report, and arranging ambulance transport via [facility].   Pt wheelchair from facility in pt room as pt initially came for outpatient procedure. Pt wheelchair placed in storage room on [address] and CSW will arrange with [facility] about facility getting wheelchair from hospital. CSW notified pt daughter via telephone of this and discussed with pt at bedside.   No further social work needs identified at this time.  CSW signing off.   [name] SpecterSuzanna Arieonna [name], MSW, LCSW Clinical Social Work [phone]

## 2014-10-25 NOTE — Progress Notes (Signed)
O2 4L for sat > 90 Some ? Of sat monitor issue Plan for CXR given ? Aspiration w/ prep

## 2014-10-25 NOTE — Progress Notes (Signed)
Alert and oriented times four, no complaints of pain. Pt discharged and being transported on 3L of oxygen by [facility].

## 2014-10-27 ENCOUNTER — Encounter (HOSPITAL_COMMUNITY): Payer: Self-pay | Admitting: Internal Medicine

## 2014-11-03 ENCOUNTER — Other Ambulatory Visit (HOSPITAL_COMMUNITY): Payer: Self-pay | Admitting: Endocrinology

## 2014-11-03 DIAGNOSIS — R1314 Dysphagia, pharyngoesophageal phase: Secondary | ICD-10-CM

## 2014-11-10 ENCOUNTER — Ambulatory Visit (HOSPITAL_COMMUNITY)
Admission: RE | Admit: 2014-11-10 | Discharge: 2014-11-10 | Disposition: A | Discharge status: Home / Self Care | Payer: Medicare Other | Source: Ambulatory Visit | Attending: Endocrinology | Admitting: Endocrinology

## 2014-11-10 DIAGNOSIS — R1313 Dysphagia, pharyngeal phase: Secondary | ICD-10-CM | POA: Diagnosis not present

## 2014-11-10 DIAGNOSIS — R1310 Dysphagia, unspecified: Secondary | ICD-10-CM | POA: Diagnosis present

## 2014-11-10 DIAGNOSIS — R1314 Dysphagia, pharyngoesophageal phase: Secondary | ICD-10-CM

## 2015-06-05 ENCOUNTER — Other Ambulatory Visit (HOSPITAL_COMMUNITY): Payer: Self-pay | Admitting: Orthopedic Surgery

## 2015-06-05 ENCOUNTER — Encounter (HOSPITAL_COMMUNITY): Payer: Self-pay | Admitting: *Deleted

## 2015-06-05 NOTE — Progress Notes (Signed)
Pt SDW- pre-op call completed by pt nurse, Jodie, L[facility]. Please assess pt DOS for Anesthesia Complications and Sleep Apnea. Nurse made aware to stop otc vitamins, fish oil, and herbal medications.Spoke with Dr. Randa EvensEdwards regarding pt history and MD advised " pt to be assessed upon arrival." Dr. Randa EvensEdwards also advised that pt not consume flavored thickened liquids the morning of surgery with medications; if pt needs medications they can consider giving them by IV.

## 2015-06-06 ENCOUNTER — Encounter (HOSPITAL_COMMUNITY): Payer: Self-pay | Admitting: Certified Registered Nurse Anesthetist

## 2015-06-06 ENCOUNTER — Inpatient Hospital Stay (HOSPITAL_COMMUNITY): Payer: Medicare Other | Admitting: Certified Registered Nurse Anesthetist

## 2015-06-06 ENCOUNTER — Encounter (HOSPITAL_COMMUNITY)
Admission: AD | Disposition: A | Discharge status: Skilled Nursing Facility | Payer: Self-pay | Source: Ambulatory Visit | Attending: Orthopedic Surgery

## 2015-06-06 ENCOUNTER — Inpatient Hospital Stay (HOSPITAL_COMMUNITY)
Admission: AD | Admit: 2015-06-06 | Discharge: 2015-06-08 | DRG: 239 | Disposition: A | Discharge status: Skilled Nursing Facility | Payer: Medicare Other | Source: Ambulatory Visit | Attending: Orthopedic Surgery | Admitting: Orthopedic Surgery

## 2015-06-06 DIAGNOSIS — G629 Polyneuropathy, unspecified: Secondary | ICD-10-CM | POA: Diagnosis present

## 2015-06-06 DIAGNOSIS — E7800 Pure hypercholesterolemia, unspecified: Secondary | ICD-10-CM | POA: Diagnosis present

## 2015-06-06 DIAGNOSIS — R1310 Dysphagia, unspecified: Secondary | ICD-10-CM | POA: Diagnosis present

## 2015-06-06 DIAGNOSIS — H269 Unspecified cataract: Secondary | ICD-10-CM | POA: Diagnosis present

## 2015-06-06 DIAGNOSIS — S82832B Other fracture of upper and lower end of left fibula, initial encounter for open fracture type I or II: Secondary | ICD-10-CM | POA: Diagnosis present

## 2015-06-06 DIAGNOSIS — M79662 Pain in left lower leg: Secondary | ICD-10-CM | POA: Diagnosis present

## 2015-06-06 DIAGNOSIS — F329 Major depressive disorder, single episode, unspecified: Secondary | ICD-10-CM | POA: Diagnosis present

## 2015-06-06 DIAGNOSIS — I714 Abdominal aortic aneurysm, without rupture: Secondary | ICD-10-CM | POA: Diagnosis present

## 2015-06-06 DIAGNOSIS — Z87891 Personal history of nicotine dependence: Secondary | ICD-10-CM

## 2015-06-06 DIAGNOSIS — S82302B Unspecified fracture of lower end of left tibia, initial encounter for open fracture type I or II: Secondary | ICD-10-CM | POA: Diagnosis present

## 2015-06-06 DIAGNOSIS — I2510 Atherosclerotic heart disease of native coronary artery without angina pectoris: Secondary | ICD-10-CM | POA: Diagnosis present

## 2015-06-06 DIAGNOSIS — Z8673 Personal history of transient ischemic attack (TIA), and cerebral infarction without residual deficits: Secondary | ICD-10-CM

## 2015-06-06 DIAGNOSIS — H9190 Unspecified hearing loss, unspecified ear: Secondary | ICD-10-CM | POA: Diagnosis present

## 2015-06-06 DIAGNOSIS — W19XXXA Unspecified fall, initial encounter: Secondary | ICD-10-CM | POA: Diagnosis present

## 2015-06-06 DIAGNOSIS — I10 Essential (primary) hypertension: Secondary | ICD-10-CM | POA: Diagnosis present

## 2015-06-06 DIAGNOSIS — I96 Gangrene, not elsewhere classified: Secondary | ICD-10-CM | POA: Diagnosis present

## 2015-06-06 DIAGNOSIS — H409 Unspecified glaucoma: Secondary | ICD-10-CM | POA: Diagnosis present

## 2015-06-06 DIAGNOSIS — J84112 Idiopathic pulmonary fibrosis: Secondary | ICD-10-CM | POA: Diagnosis present

## 2015-06-06 DIAGNOSIS — I998 Other disorder of circulatory system: Secondary | ICD-10-CM | POA: Diagnosis present

## 2015-06-06 DIAGNOSIS — J449 Chronic obstructive pulmonary disease, unspecified: Secondary | ICD-10-CM | POA: Diagnosis present

## 2015-06-06 DIAGNOSIS — F419 Anxiety disorder, unspecified: Secondary | ICD-10-CM | POA: Diagnosis present

## 2015-06-06 DIAGNOSIS — M810 Age-related osteoporosis without current pathological fracture: Secondary | ICD-10-CM | POA: Diagnosis present

## 2015-06-06 DIAGNOSIS — M1990 Unspecified osteoarthritis, unspecified site: Secondary | ICD-10-CM | POA: Diagnosis present

## 2015-06-06 HISTORY — DX: Anxiety disorder, unspecified: F41.9

## 2015-06-06 HISTORY — DX: Major depressive disorder, single episode, unspecified: F32.9

## 2015-06-06 HISTORY — DX: Unspecified glaucoma: H40.9

## 2015-06-06 HISTORY — DX: Chronic obstructive pulmonary disease, unspecified: J44.9

## 2015-06-06 HISTORY — DX: Unspecified hearing loss, unspecified ear: H91.90

## 2015-06-06 HISTORY — DX: Other skin changes: R23.8

## 2015-06-06 HISTORY — DX: Pure hypercholesterolemia, unspecified: E78.00

## 2015-06-06 HISTORY — DX: Idiopathic pulmonary fibrosis: J84.112

## 2015-06-06 HISTORY — DX: Depression, unspecified: F32.A

## 2015-06-06 HISTORY — PX: AMPUTATION: SHX166

## 2015-06-06 HISTORY — DX: Dysphasia: R47.02

## 2015-06-06 HISTORY — DX: Atherosclerotic heart disease of native coronary artery without angina pectoris: I25.10

## 2015-06-06 HISTORY — DX: Reserved for inherently not codable concepts without codable children: IMO0001

## 2015-06-06 HISTORY — DX: Disease of pancreas, unspecified: K86.9

## 2015-06-06 HISTORY — DX: Spontaneous ecchymoses: R23.3

## 2015-06-06 HISTORY — DX: Unspecified cataract: H26.9

## 2015-06-06 LAB — COMPREHENSIVE METABOLIC PANEL
ALT: 24 U/L (ref 14–54)
ANION GAP: 12 (ref 5–15)
AST: 39 U/L (ref 15–41)
Albumin: 2 g/dL — ABNORMAL LOW (ref 3.5–5.0)
Alkaline Phosphatase: 134 U/L — ABNORMAL HIGH (ref 38–126)
BUN: 41 mg/dL — AB (ref 6–20)
CHLORIDE: 102 mmol/L (ref 101–111)
CO2: 27 mmol/L (ref 22–32)
Calcium: 9.4 mg/dL (ref 8.9–10.3)
Creatinine, Ser: 1.41 mg/dL — ABNORMAL HIGH (ref 0.44–1.00)
GFR, EST AFRICAN AMERICAN: 40 mL/min — AB (ref >60)
GFR, EST NON AFRICAN AMERICAN: 34 mL/min — AB (ref >60)
Glucose, Bld: 109 mg/dL — ABNORMAL HIGH (ref 65–99)
POTASSIUM: 4.4 mmol/L (ref 3.5–5.1)
Sodium: 141 mmol/L (ref 135–145)
Total Bilirubin: 0.8 mg/dL (ref 0.3–1.2)
Total Protein: 5.9 g/dL — ABNORMAL LOW (ref 6.5–8.1)

## 2015-06-06 LAB — PROTIME-INR
INR: 1.28 (ref 0.00–1.49)
PROTHROMBIN TIME: 16.2 s — AB (ref 11.6–15.2)

## 2015-06-06 LAB — SURGICAL PCR SCREEN
MRSA, PCR: POSITIVE — AB
STAPHYLOCOCCUS AUREUS: POSITIVE — AB

## 2015-06-06 LAB — CBC
HEMATOCRIT: 41.9 % (ref 36.0–46.0)
HEMOGLOBIN: 12.7 g/dL (ref 12.0–15.0)
MCH: 27.4 pg (ref 26.0–34.0)
MCHC: 30.3 g/dL (ref 30.0–36.0)
MCV: 90.3 fL (ref 78.0–100.0)
PLATELETS: 234 10*3/uL (ref 150–400)
RBC: 4.64 MIL/uL (ref 3.87–5.11)
RDW: 15.4 % (ref 11.5–15.5)
WBC: 11.7 10*3/uL — ABNORMAL HIGH (ref 4.0–10.5)

## 2015-06-06 LAB — APTT: APTT: 31 s (ref 24–37)

## 2015-06-06 SURGERY — AMPUTATION, ABOVE KNEE
Anesthesia: General | Site: Leg Upper | Laterality: Left

## 2015-06-06 MED ORDER — SUGAMMADEX SODIUM 200 MG/2ML IV SOLN
INTRAVENOUS | Status: DC | PRN
  Administered 2015-06-06: 200 mg via INTRAVENOUS

## 2015-06-06 MED ORDER — BISACODYL 5 MG PO TBEC
5.0000 mg | DELAYED_RELEASE_TABLET | Freq: Every evening | ORAL | Status: DC
  Administered 2015-06-06: 5 mg via ORAL
  Filled 2015-06-06: qty 1

## 2015-06-06 MED ORDER — ACETAMINOPHEN 650 MG RE SUPP: 650.0000 mg | Freq: Four times a day (QID) | RECTAL | Status: DC | PRN

## 2015-06-06 MED ORDER — ASPIRIN EC 325 MG PO TBEC: 325.0000 mg | DELAYED_RELEASE_TABLET | Freq: Every day | ORAL | Status: DC

## 2015-06-06 MED ORDER — LIDOCAINE HCL (CARDIAC) 20 MG/ML IV SOLN
INTRAVENOUS | Status: AC
  Filled 2015-06-06: qty 5

## 2015-06-06 MED ORDER — METOCLOPRAMIDE HCL 5 MG PO TABS: 5.0000 mg | ORAL_TABLET | Freq: Three times a day (TID) | ORAL | Status: DC | PRN

## 2015-06-06 MED ORDER — PROPOFOL 10 MG/ML IV BOLUS
INTRAVENOUS | Status: AC
  Filled 2015-06-06: qty 20

## 2015-06-06 MED ORDER — LACTATED RINGERS IV SOLN
INTRAVENOUS | Status: DC
  Administered 2015-06-06 (×2): via INTRAVENOUS

## 2015-06-06 MED ORDER — SENNA 8.6 MG PO TABS
1.0000 | ORAL_TABLET | Freq: Every day | ORAL | Status: DC
  Administered 2015-06-07: 8.6 mg via ORAL
  Filled 2015-06-06: qty 1

## 2015-06-06 MED ORDER — ONDANSETRON HCL 4 MG/2ML IJ SOLN
INTRAMUSCULAR | Status: DC | PRN
  Administered 2015-06-06: 4 mg via INTRAVENOUS

## 2015-06-06 MED ORDER — FERROUS SULFATE 325 (65 FE) MG PO TABS
325.0000 mg | ORAL_TABLET | Freq: Two times a day (BID) | ORAL | Status: DC
  Administered 2015-06-06: 325 mg via ORAL
  Filled 2015-06-06: qty 1

## 2015-06-06 MED ORDER — ACETAMINOPHEN 325 MG PO TABS: 650.0000 mg | ORAL_TABLET | Freq: Four times a day (QID) | ORAL | Status: DC | PRN

## 2015-06-06 MED ORDER — PHENYLEPHRINE HCL 10 MG/ML IJ SOLN
INTRAMUSCULAR | Status: DC | PRN
  Administered 2015-06-06 (×3): 80 ug via INTRAVENOUS

## 2015-06-06 MED ORDER — OMEGA-3-ACID ETHYL ESTERS 1 G PO CAPS: 2.0000 g | ORAL_CAPSULE | Freq: Every day | ORAL | Status: DC

## 2015-06-06 MED ORDER — ONDANSETRON HCL 4 MG/2ML IJ SOLN
INTRAMUSCULAR | Status: AC
  Filled 2015-06-06: qty 2

## 2015-06-06 MED ORDER — HYDROCODONE-ACETAMINOPHEN 5-325 MG PO TABS: 1.0000 | ORAL_TABLET | Freq: Four times a day (QID) | ORAL | Status: AC | PRN

## 2015-06-06 MED ORDER — CLINDAMYCIN PHOSPHATE 600 MG/50ML IV SOLN
600.0000 mg | Freq: Four times a day (QID) | INTRAVENOUS | Status: AC
Start: 2015-06-06 — End: 2015-06-07
  Administered 2015-06-06 – 2015-06-07 (×2): 600 mg via INTRAVENOUS
  Filled 2015-06-06 (×4): qty 50

## 2015-06-06 MED ORDER — PROPYLENE GLYCOL 0.6 % OP SOLN: 2.0000 [drp] | Freq: Three times a day (TID) | OPHTHALMIC | Status: DC

## 2015-06-06 MED ORDER — FENTANYL CITRATE (PF) 100 MCG/2ML IJ SOLN
INTRAMUSCULAR | Status: DC | PRN
  Administered 2015-06-06 (×2): 75 ug via INTRAVENOUS
  Administered 2015-06-06: 50 ug via INTRAVENOUS

## 2015-06-06 MED ORDER — CHLORHEXIDINE GLUCONATE 4 % EX LIQD: 60.0000 mL | Freq: Once | CUTANEOUS | Status: DC

## 2015-06-06 MED ORDER — METHOCARBAMOL 1000 MG/10ML IJ SOLN: 500.0000 mg | Freq: Four times a day (QID) | INTRAVENOUS | Status: DC | PRN

## 2015-06-06 MED ORDER — SENNOSIDES 8.6 MG PO TABS: 1.0000 | ORAL_TABLET | Freq: Every day | ORAL | Status: DC

## 2015-06-06 MED ORDER — FENTANYL CITRATE (PF) 100 MCG/2ML IJ SOLN
INTRAMUSCULAR | Status: AC
  Filled 2015-06-06: qty 2

## 2015-06-06 MED ORDER — POLYVINYL ALCOHOL 1.4 % OP SOLN
2.0000 [drp] | Freq: Three times a day (TID) | OPHTHALMIC | Status: DC
  Administered 2015-06-07 (×2): 2 [drp] via OPHTHALMIC
  Filled 2015-06-06: qty 15

## 2015-06-06 MED ORDER — METOCLOPRAMIDE HCL 5 MG/ML IJ SOLN: 5.0000 mg | Freq: Three times a day (TID) | INTRAMUSCULAR | Status: DC | PRN

## 2015-06-06 MED ORDER — SODIUM CHLORIDE 0.9 % IV SOLN: INTRAVENOUS | Status: DC

## 2015-06-06 MED ORDER — SUGAMMADEX SODIUM 200 MG/2ML IV SOLN
INTRAVENOUS | Status: AC
  Filled 2015-06-06: qty 2

## 2015-06-06 MED ORDER — FENTANYL CITRATE (PF) 250 MCG/5ML IJ SOLN
INTRAMUSCULAR | Status: AC
  Filled 2015-06-06: qty 5

## 2015-06-06 MED ORDER — AMLODIPINE BESYLATE 2.5 MG PO TABS
2.5000 mg | ORAL_TABLET | Freq: Every day | ORAL | Status: DC
  Administered 2015-06-06 – 2015-06-07 (×2): 2.5 mg via ORAL
  Filled 2015-06-06 (×3): qty 1

## 2015-06-06 MED ORDER — EPHEDRINE SULFATE 50 MG/ML IJ SOLN
INTRAMUSCULAR | Status: DC | PRN
  Administered 2015-06-06: 5 mg via INTRAVENOUS
  Administered 2015-06-06 (×2): 10 mg via INTRAVENOUS

## 2015-06-06 MED ORDER — ROCURONIUM BROMIDE 100 MG/10ML IV SOLN
INTRAVENOUS | Status: DC | PRN
  Administered 2015-06-06: 30 mg via INTRAVENOUS

## 2015-06-06 MED ORDER — IPRATROPIUM-ALBUTEROL 0.5-2.5 (3) MG/3ML IN SOLN: 3.0000 mL | Freq: Four times a day (QID) | RESPIRATORY_TRACT | Status: DC | PRN

## 2015-06-06 MED ORDER — ONDANSETRON HCL 4 MG/2ML IJ SOLN: 4.0000 mg | Freq: Once | INTRAMUSCULAR | Status: DC | PRN

## 2015-06-06 MED ORDER — FENTANYL CITRATE (PF) 100 MCG/2ML IJ SOLN
25.0000 ug | INTRAMUSCULAR | Status: DC | PRN
  Administered 2015-06-06 (×2): 25 ug via INTRAVENOUS

## 2015-06-06 MED ORDER — PANTOPRAZOLE SODIUM 40 MG PO TBEC
40.0000 mg | DELAYED_RELEASE_TABLET | Freq: Every day | ORAL | Status: DC
  Administered 2015-06-06: 40 mg via ORAL
  Filled 2015-06-06: qty 1

## 2015-06-06 MED ORDER — PHENYLEPHRINE 40 MCG/ML (10ML) SYRINGE FOR IV PUSH (FOR BLOOD PRESSURE SUPPORT)
PREFILLED_SYRINGE | INTRAVENOUS | Status: AC
  Filled 2015-06-06: qty 20

## 2015-06-06 MED ORDER — LIDOCAINE HCL (CARDIAC) 20 MG/ML IV SOLN
INTRAVENOUS | Status: DC | PRN
  Administered 2015-06-06: 100 mg via INTRAVENOUS

## 2015-06-06 MED ORDER — HYDROXYZINE HCL 25 MG PO TABS: 25.0000 mg | ORAL_TABLET | Freq: Three times a day (TID) | ORAL | Status: DC | PRN

## 2015-06-06 MED ORDER — PHENYLEPHRINE 40 MCG/ML (10ML) SYRINGE FOR IV PUSH (FOR BLOOD PRESSURE SUPPORT)
PREFILLED_SYRINGE | INTRAVENOUS | Status: AC
  Filled 2015-06-06: qty 10

## 2015-06-06 MED ORDER — ESCITALOPRAM OXALATE 10 MG PO TABS
10.0000 mg | ORAL_TABLET | Freq: Every day | ORAL | Status: DC
  Administered 2015-06-06 – 2015-06-07 (×2): 10 mg via ORAL
  Filled 2015-06-06 (×3): qty 1

## 2015-06-06 MED ORDER — METHOCARBAMOL 500 MG PO TABS: 500.0000 mg | ORAL_TABLET | Freq: Four times a day (QID) | ORAL | Status: DC | PRN

## 2015-06-06 MED ORDER — ROSUVASTATIN CALCIUM 5 MG PO TABS
5.0000 mg | ORAL_TABLET | Freq: Every day | ORAL | Status: DC
  Administered 2015-06-06 – 2015-06-07 (×2): 5 mg via ORAL
  Filled 2015-06-06 (×2): qty 1

## 2015-06-06 MED ORDER — HYDROMORPHONE HCL 1 MG/ML IJ SOLN
0.5000 mg | INTRAMUSCULAR | Status: DC | PRN
  Administered 2015-06-06: 0.5 mg via INTRAVENOUS
  Filled 2015-06-06 (×2): qty 1

## 2015-06-06 MED ORDER — OMEGA-3 FATTY ACIDS 1000 MG PO CAPS: 2.0000 g | ORAL_CAPSULE | ORAL | Status: DC

## 2015-06-06 MED ORDER — TRAMADOL HCL 50 MG PO TABS: 50.0000 mg | ORAL_TABLET | Freq: Four times a day (QID) | ORAL | Status: DC | PRN

## 2015-06-06 MED ORDER — PROMETHAZINE HCL 25 MG RE SUPP: 25.0000 mg | Freq: Three times a day (TID) | RECTAL | Status: DC | PRN

## 2015-06-06 MED ORDER — ONDANSETRON HCL 4 MG PO TABS: 4.0000 mg | ORAL_TABLET | Freq: Four times a day (QID) | ORAL | Status: DC | PRN

## 2015-06-06 MED ORDER — POLYETHYLENE GLYCOL 3350 17 G PO PACK
17.0000 g | PACK | ORAL | Status: DC
  Administered 2015-06-07: 17 g via ORAL
  Filled 2015-06-06: qty 1

## 2015-06-06 MED ORDER — PROPOFOL 10 MG/ML IV BOLUS
INTRAVENOUS | Status: DC | PRN
  Administered 2015-06-06: 50 mg via INTRAVENOUS

## 2015-06-06 MED ORDER — ROCURONIUM BROMIDE 50 MG/5ML IV SOLN
INTRAVENOUS | Status: AC
  Filled 2015-06-06: qty 1

## 2015-06-06 MED ORDER — EPHEDRINE SULFATE 50 MG/ML IJ SOLN
INTRAMUSCULAR | Status: AC
  Filled 2015-06-06: qty 1

## 2015-06-06 MED ORDER — HYDROCODONE-ACETAMINOPHEN 5-325 MG PO TABS
1.0000 | ORAL_TABLET | ORAL | Status: DC | PRN
  Administered 2015-06-06 – 2015-06-07 (×2): 1 via ORAL
  Administered 2015-06-07: 2 via ORAL
  Administered 2015-06-08 (×2): 1 via ORAL
  Filled 2015-06-06 (×2): qty 1
  Filled 2015-06-06: qty 2
  Filled 2015-06-06 (×3): qty 1

## 2015-06-06 MED ORDER — 0.9 % SODIUM CHLORIDE (POUR BTL) OPTIME
TOPICAL | Status: DC | PRN
  Administered 2015-06-06: 1000 mL

## 2015-06-06 MED ORDER — CLINDAMYCIN PHOSPHATE 900 MG/50ML IV SOLN
900.0000 mg | INTRAVENOUS | Status: AC
  Administered 2015-06-06: 900 mg via INTRAVENOUS
  Filled 2015-06-06: qty 50

## 2015-06-06 MED ORDER — PREGABALIN 75 MG PO CAPS
75.0000 mg | ORAL_CAPSULE | Freq: Three times a day (TID) | ORAL | Status: DC
  Administered 2015-06-06 – 2015-06-07 (×4): 75 mg via ORAL
  Filled 2015-06-06 (×6): qty 1

## 2015-06-06 MED ORDER — ONDANSETRON HCL 4 MG/2ML IJ SOLN: 4.0000 mg | Freq: Four times a day (QID) | INTRAMUSCULAR | Status: DC | PRN

## 2015-06-06 SURGICAL SUPPLY — 47 items
BLADE SAW RECIP 87.9 MT (BLADE) ×6 IMPLANT
BNDG COHESIVE 6X5 TAN STRL LF (GAUZE/BANDAGES/DRESSINGS) ×3 IMPLANT
BNDG GAUZE ELAST 4 BULKY (GAUZE/BANDAGES/DRESSINGS) ×3 IMPLANT
COVER SURGICAL LIGHT HANDLE (MISCELLANEOUS) ×6 IMPLANT
CUFF TOURNIQUET SINGLE 34IN LL (TOURNIQUET CUFF) IMPLANT
CUFF TOURNIQUET SINGLE 44IN (TOURNIQUET CUFF) IMPLANT
DRAIN PENROSE 1/2X12 LTX STRL (WOUND CARE) IMPLANT
DRAPE EXTREMITY T 121X128X90 (DRAPE) ×3 IMPLANT
DRAPE PROXIMA HALF (DRAPES) ×3 IMPLANT
DRAPE U-SHAPE 47X51 STRL (DRAPES) ×6 IMPLANT
DRSG ADAPTIC 3X8 NADH LF (GAUZE/BANDAGES/DRESSINGS) ×3 IMPLANT
DRSG PAD ABDOMINAL 8X10 ST (GAUZE/BANDAGES/DRESSINGS) ×6 IMPLANT
DURAPREP 26ML APPLICATOR (WOUND CARE) ×3 IMPLANT
ELECT CAUTERY BLADE 6.4 (BLADE) IMPLANT
ELECT REM PT RETURN 9FT ADLT (ELECTROSURGICAL) ×3
ELECTRODE REM PT RTRN 9FT ADLT (ELECTROSURGICAL) ×1 IMPLANT
EVACUATOR 1/8 PVC DRAIN (DRAIN) IMPLANT
GAUZE SPONGE 4X4 12PLY STRL (GAUZE/BANDAGES/DRESSINGS) ×3 IMPLANT
GLOVE BIO SURGEON STRL SZ 6.5 (GLOVE) ×4 IMPLANT
GLOVE BIO SURGEONS STRL SZ 6.5 (GLOVE) ×2
GLOVE BIOGEL PI IND STRL 9 (GLOVE) ×1 IMPLANT
GLOVE BIOGEL PI INDICATOR 9 (GLOVE) ×2
GLOVE SURG ORTHO 9.0 STRL STRW (GLOVE) ×3 IMPLANT
GOWN STRL REUS W/ TWL XL LVL3 (GOWN DISPOSABLE) ×2 IMPLANT
GOWN STRL REUS W/TWL XL LVL3 (GOWN DISPOSABLE) ×4
KIT BASIN OR (CUSTOM PROCEDURE TRAY) ×3 IMPLANT
KIT ROOM TURNOVER OR (KITS) ×3 IMPLANT
MANIFOLD NEPTUNE II (INSTRUMENTS) ×3 IMPLANT
NS IRRIG 1000ML POUR BTL (IV SOLUTION) ×3 IMPLANT
PACK GENERAL/GYN (CUSTOM PROCEDURE TRAY) ×3 IMPLANT
PAD ARMBOARD 7.5X6 YLW CONV (MISCELLANEOUS) ×3 IMPLANT
PADDING CAST ABS 6INX4YD NS (CAST SUPPLIES) ×2
PADDING CAST ABS COTTON 6X4 NS (CAST SUPPLIES) ×1 IMPLANT
SPONGE GAUZE 4X4 12PLY STER LF (GAUZE/BANDAGES/DRESSINGS) ×3 IMPLANT
STAPLER VISISTAT 35W (STAPLE) IMPLANT
STOCKINETTE IMPERVIOUS LG (DRAPES) IMPLANT
SUT ETHILON 2 0 PSLX (SUTURE) ×9 IMPLANT
SUT PDS AB 1 CT  36 (SUTURE)
SUT PDS AB 1 CT 36 (SUTURE) IMPLANT
SUT SILK 2 0 (SUTURE) ×2
SUT SILK 2-0 18XBRD TIE 12 (SUTURE) ×1 IMPLANT
SUT VIC AB 1 CTX 27 (SUTURE) ×3 IMPLANT
SWAB COLLECTION DEVICE MRSA (MISCELLANEOUS) IMPLANT
TOWEL OR 17X24 6PK STRL BLUE (TOWEL DISPOSABLE) ×3 IMPLANT
TOWEL OR 17X26 10 PK STRL BLUE (TOWEL DISPOSABLE) ×3 IMPLANT
TUBE ANAEROBIC SPECIMEN COL (MISCELLANEOUS) IMPLANT
WATER STERILE IRR 1000ML POUR (IV SOLUTION) ×3 IMPLANT

## 2015-06-06 NOTE — Anesthesia Postprocedure Evaluation (Signed)
Anesthesia Post Note  Patient: [name]  Procedure(s) Performed: Procedure(s) (LRB): Left Above Knee Amputation (Left)  Patient location during evaluation: PACU Anesthesia Type: General Level of consciousness: awake and alert (neuro status at baseline) Pain management: pain level controlled Vital Signs Assessment: post-procedure vital signs reviewed and stable Respiratory status: spontaneous breathing, nonlabored ventilation, respiratory function stable and patient connected to nasal cannula oxygen Cardiovascular status: blood pressure returned to baseline and stable Postop Assessment: no signs of nausea or vomiting Anesthetic complications: no    Last Vitals:  Filed Vitals:   06/06/15 1330 06/06/15 1430  BP:  110/44  Pulse: 87 73  Temp:    Resp: 10 15    Last Pain: There were no vitals filed for this visit.               Cecile HearingStephen Edward Turk

## 2015-06-06 NOTE — H&P (Signed)
[name] is an 81 y.o. female.   Chief Complaint: Open fracture dislocation left distal tibia and fibula with a cold ischemic left leg HPI: Patient is 81 year old woman nonambulatory noncommunicative who fell sustaining a distal tibia and fibular fracture. Patient's lower extremity is cold ischemic with bone protruding from the lateral aspect of her ankle.  Past Medical History  Diagnosis Date  . Abdominal aneurysm without mention of rupture   . Cholelithiasis   . Hyperlipidemia   . TIA (transient ischemic attack) ~ 09/2010    "[name]"  . Neuropathic pain   . Osteoporosis   . Arthritis   . Hypertension 04/30/11    "not a problem now"  . Pneumonia     "once or twice"  . Migraines   . Headache(784.0)   . Peripheral neuropathy (HCC)     "hands, feet, legs"  . Stroke (HCC)     hx of TIA  . Chronic kidney disease     NEPHRECTOMY  . Achalasia -suspected by hx/ba swallow 01/11/2014  . Gastritis   . Ischemic colitis (HCC)   . Anemia   . GI bleed   . COPD (chronic obstructive pulmonary disease) (HCC)   . Dysphasia   . Pancreatic disease   . Glaucoma   . Cataracts, bilateral   . Hypercholesterolemia   . Anxiety   . Depression     depressive disorder  . Bruises easily     ecchymotic areas T/o  . Coronary artery disease   . Idiopathic pulmonary fibrosis (HCC)   . Hearing impaired     Past Surgical History  Procedure Laterality Date  . Carotid endarterectomy  03/1999; 04/1999    bilateral (Dr. Edilia Bo)  . Nephrectomy  1954    "double left kidney out"  . Tonsillectomy      "when I was little"  . Abdominal hysterectomy  1984  . Tubal ligation  1975  . Dilation and curettage of uterus      "I've had a bunch of those"  . Colon surgery  2002    "8 inches of my colon taken out"  . Fracture surgery  02/2008    "femur; put rod & screws in"  . Fracture surgery  03/2008    "replaced screws in femur"  . Fracture surgery  04/2008    "replaced  hardware S/P rebreak femur"  . Esophagogastroduodenoscopy N/A 01/11/2014    Procedure: ESOPHAGOGASTRODUODENOSCOPY (EGD);  Surgeon: Iva Boop, MD;  Location: [facility];  Service: Endoscopy;  Laterality: N/A;  . Botox injection N/A 01/11/2014    Procedure: BOTOX INJECTION;  Surgeon: Iva Boop, MD;  Location: [facility] ENDOSCOPY;  Service: Endoscopy;  Laterality: N/A;  . Carotid angiogram N/A 06/30/2011    Procedure: CAROTID ANGIOGRAM;  Surgeon: Chuck Hint, MD;  Location: [facility] CATH LAB;  Service: Cardiovascular;  Laterality: N/A;  . Colonoscopy N/A 10/25/2014    Procedure: COLONOSCOPY;  Surgeon: Iva Boop, MD;  Location: [facility] ENDOSCOPY;  Service: Endoscopy;  Laterality: N/A;    Family History  Problem Relation Age of Onset  . Diabetes Mother   . Heart disease Mother   . Hypertension Mother   . Hyperlipidemia Mother   . Aneurysm Father   . Diabetes Brother    Social History:  reports that she has quit smoking. Her smoking use included Cigarettes. She started smoking about 3 years ago. She has a 15 pack-year smoking history. She has never used smokeless tobacco. She  reports that she does not drink alcohol or use illicit drugs.  Allergies:  Allergies  Allergen Reactions  . Chocolate Other (See Comments)  . Aminoglycosides Other (See Comments)    unknown  . Codeine Rash  . Erythromycin Other (See Comments)    unknown  . Macrolides And Ketolides Other (See Comments)    unknown  . Penicillins Rash  . Vancomycin Other (See Comments)    unknown    No prescriptions prior to admission    No results found for this or any previous visit (from the past 48 hour(s)). No results found.  Review of Systems  All other systems reviewed and are negative.   There were no vitals taken for this visit. Physical Exam  On examination patient has a mottled cold ischemic left lower extremity her skin is thin and atrophic she is cachectic she has exposed bone with the fibula and tibial  protruding from the lateral aspect of her ankle patient is nonambulatory noncommunicative. Assessment/Plan Assessment: Fracture dislocation left distal tibia and fibula with ischemic gangrenous left lower extremity.  Plan: We'll plan for above-the-knee amputation. Patient does have hardware involving most of the femur this will be a very distal transfemoral amputation. Risks and benefits were discussed with the family including risk of the wound not healing. Patient's family state they understand wish to proceed at this time.  Alanna Storti V 06/06/2015, 6:50 AM

## 2015-06-06 NOTE — Op Note (Signed)
06/06/2015  1:14 PM  PATIENT:  [name]    PRE-OPERATIVE DIAGNOSIS:  Open Fracture/Dislocation Left Ankle with Gangrene Internal fixation 4 femur fracture  POST-OPERATIVE DIAGNOSIS:  Same  PROCEDURE:  Left Above Knee Amputation Removal of deep retained hardware  SURGEON:  Nadara MustardUDA,Cameron Katayama V, MD  PHYSICIAN ASSISTANT:None ANESTHESIA:   General  PREOPERATIVE INDICATIONS:  [name] BilisCarolyn A Chery is a  81 y.o. female with a diagnosis of Open Fracture/Dislocation Left Ankle with Gangrene who failed conservative measures and elected for surgical management.    The risks benefits and alternatives were discussed with the patient preoperatively including but not limited to the risks of infection, bleeding, nerve injury, cardiopulmonary complications, the need for revision surgery, among others, and the patient was willing to proceed.  OPERATIVE IMPLANTS: None  OPERATIVE FINDINGS: Minimal petechial bleeding, thin atrophic skin. Left thigh is cool to touch.   OPERATIVE PROCEDURE: Patient brought the operating room and underwent a general anesthetic. After adequate levels anesthesia were obtained patient's left lower extremity was prepped using DuraPrep draped into a sterile field the open fracture dislocation of the ankle was draped out of sterile field with impervious stockinette. A fishmouth incision was made just distal to the femoral condyles. This was carried down to the bone vascular bundle was identified and suture ligated with 2-0 silk. The leg was amputated through the knee. The distal aspect of the femoral condyles was resected distal interlocking screw was removed. This allowed for sufficient soft tissue coverage. The plate was left in place. The skin was closed using 2-0 nylon the wound was covered with a sterile compressive dressing. Patient was extubated taken the PACU in stable condition.

## 2015-06-06 NOTE — Progress Notes (Signed)
Spoke the MaxbassKatie, Charity fundraiserN at [facility] and asked her what kind of thicken liquid pt is on. She answered Honey thicken. Will notify MD for orders.

## 2015-06-06 NOTE — Anesthesia Procedure Notes (Signed)
Procedure Name: Intubation Date/Time: 06/06/2015 12:21 PM Performed by: Little IshikawaMERCER, Kamani Magnussen L Pre-anesthesia Checklist: Patient identified, Timeout performed, Emergency Drugs available, Suction available and Patient being monitored Patient Re-evaluated:Patient Re-evaluated prior to inductionOxygen Delivery Method: Circle system utilized Preoxygenation: Pre-oxygenation with 100% oxygen Intubation Type: IV induction Ventilation: Mask ventilation without difficulty Laryngoscope Size: Mac and 3 Grade View: Grade I Tube type: Oral Tube size: 7.0 mm Number of attempts: 1 Airway Equipment and Method: Stylet Placement Confirmation: ETT inserted through vocal cords under direct vision,  positive ETCO2 and breath sounds checked- equal and bilateral Secured at: 22 cm Tube secured with: Tape Dental Injury: Teeth and Oropharynx as per pre-operative assessment

## 2015-06-06 NOTE — Progress Notes (Signed)
Thayer OhmChris called us and telephone consent obtained.

## 2015-06-06 NOTE — Anesthesia Preprocedure Evaluation (Addendum)
Anesthesia Evaluation  Patient identified by MRN, date of birth, ID band Patient confused    Reviewed: Allergy & Precautions, NPO status , Patient's Chart, lab work & pertinent test results  Airway Mallampati: II  TM Distance: >3 FB Neck ROM: Full    Dental  (+) Dental Advisory Given, Edentulous Upper, Edentulous Lower   Pulmonary COPD,  COPD inhaler, former smoker,  Idiopathic pulmonary fibrosis    Pulmonary exam normal breath sounds clear to auscultation       Cardiovascular hypertension, Pt. on medications (-) angina+ CAD and + Peripheral Vascular Disease  (-) Past MI Normal cardiovascular exam Rhythm:Regular Rate:Normal     Neuro/Psych  Headaches, PSYCHIATRIC DISORDERS Anxiety Depression TIA Neuromuscular disease CVA, Residual Symptoms    GI/Hepatic Neg liver ROS, GERD  Medicated,Achalasia   Endo/Other  negative endocrine ROS  Renal/GU Renal InsufficiencyRenal diseaseS/p nephrectomy     Musculoskeletal  (+) Arthritis , Osteoarthritis,    Abdominal   Peds  Hematology  (+) Blood dyscrasia, anemia ,   Anesthesia Other Findings Day of surgery medications reviewed with the patient.  Reproductive/Obstetrics                          Anesthesia Physical Anesthesia Plan  ASA: III  Anesthesia Plan: General   Post-op Pain Management:    Induction: Intravenous  Airway Management Planned: Oral ETT  Additional Equipment:   Intra-op Plan:   Post-operative Plan: Extubation in OR  Informed Consent: I have reviewed the patients History and Physical, chart, labs and discussed the procedure including the risks, benefits and alternatives for the proposed anesthesia with the patient or authorized representative who has indicated his/her understanding and acceptance.   Dental advisory given  Plan Discussed with: CRNA  Anesthesia Plan Comments: (Risks/benefits of general anesthesia discussed  with patient including risk of damage to teeth, lips, gum, and tongue, nausea/vomiting, allergic reactions to medications, and the possibility of heart attack, stroke and death.  All patient questions answered.  Patient wishes to proceed.)        Anesthesia Quick Evaluation

## 2015-06-06 NOTE — Transfer of Care (Signed)
Immediate Anesthesia Transfer of Care Note  Patient: [name]  Procedure(s) Performed: Procedure(s): Left Above Knee Amputation (Left)  Patient Location: PACU  Anesthesia Type:General  Level of Consciousness: awake  Airway & Oxygen Therapy: Patient Spontanous Breathing and Patient connected to nasal cannula oxygen  Post-op Assessment: Report given to RN and Post -op Vital signs reviewed and stable  Post vital signs: stable  Last Vitals:  Filed Vitals:   06/06/15 0924 06/06/15 1323  BP: 134/54 126/49  Pulse: 75 89  Temp: 36.9 C 36.7 C  Resp: 14 8    Complications: No apparent anesthesia complications

## 2015-06-06 NOTE — Discharge Summary (Signed)
Physician Discharge Summary  Patient ID: [name] MRN: [id number] DOB/AGE: 1934-03-13 81 y.o.  Admit date: 06/06/2015 Discharge date: 06/06/2015  Admission Diagnoses: Gangrene left lower extremity with open fracture dislocation left ankle  Discharge Diagnoses:  Active Problems:   Gangrene of foot [facility])   Discharged Condition: stable  Hospital Course: Patient's hospital course was essentially unremarkable. She underwent an above-the-knee amputation. This was a long amputation due to the extensive hardware in the lateral aspect of the femur. If patient has skin breakdown and she would require extensive surgery for removal of the hardware which extends the entire lateral aspect of her femur.  Consults: None  Significant Diagnostic Studies: labs: Routine labs Treatments: surgery: See operative note  Discharge Exam: Blood pressure 100/50, pulse 76, temperature 98.1 F (36.7 C), temperature source Oral, resp. rate 20, height  (1.651 m), weight 73.301 kg (161 lb 9.6 oz), SpO2 90 %. Incision/Wound: dressing clean and dry  Disposition: [facility]-Another Health Care Institution Not Defined  Discharge Instructions    Call MD / Call 911    Complete by:  As directed   If you experience chest pain or shortness of breath, CALL 911 and be transported to the hospital emergency room.  If you develope a fever above 101 F, pus (white drainage) or increased drainage or redness at the wound, or calf pain, call your surgeon's office.     Constipation Prevention    Complete by:  As directed   Drink plenty of fluids.  Prune juice may be helpful.  You may use a stool softener, such as Colace (over the counter) 100 mg twice a day.  Use MiraLax (over the counter) for constipation as needed.     Diet - low sodium heart healthy    Complete by:  As directed      Increase activity slowly as tolerated    Complete by:  As directed             Medication List    TAKE these medications        amLODipine 2.5 MG tablet  Commonly known as:  NORVASC  Take 2.5 mg by mouth daily.     aspirin EC 325 MG tablet  Take 325 mg by mouth every morning.     bisacodyl 5 MG EC tablet  Commonly known as:  DULCOLAX  Take 5 mg by mouth every evening.     Cranberry 475 MG Caps  Take 1 capsule by mouth 2 (two) times daily.     CREON 24000 UNITS Cpep  Generic drug:  Pancrelipase (Lip-Prot-Amyl)  Take 2 capsules by mouth 3 (three) times daily before meals.     escitalopram 10 MG tablet  Commonly known as:  LEXAPRO  Take 10 mg by mouth daily.     ferrous sulfate 325 (65 FE) MG tablet  Take 325 mg by mouth 2 (two) times daily with a meal.     fish oil-omega-3 fatty acids 1000 MG capsule  Take 2 g by mouth every morning.     HYDROcodone-acetaminophen 5-325 MG tablet  Commonly known as:  NORCO/VICODIN  Take 1 tablet by mouth every 6 (six) hours as needed for moderate pain.     HYDROcodone-acetaminophen 5-325 MG tablet  Commonly known as:  NORCO  Take 1 tablet by mouth every 6 (six) hours as needed.     hydrOXYzine 25 MG tablet  Commonly known as:  ATARAX/VISTARIL  Take 25 mg by mouth 3 (three) times daily as needed  for itching.     ipratropium-albuterol 0.5-2.5 (3) MG/3ML Soln  Commonly known as:  DUONEB  Take 3 mLs by nebulization every 6 (six) hours as needed (for shortness of breath).     multivitamin tablet  Take 1 tablet by mouth daily.     multivitamin-lutein Caps capsule  Take 1 capsule by mouth daily.     omeprazole 20 MG capsule  Commonly known as:  PRILOSEC  Take 20 mg by mouth daily.     polyethylene glycol packet  Commonly known as:  MIRALAX / GLYCOLAX  Take 17 g by mouth every other day.     pregabalin 75 MG capsule  Commonly known as:  LYRICA  Take 75 mg by mouth 3 (three) times daily.     promethazine 25 MG suppository  Commonly known as:  PHENERGAN  Place 25 mg rectally every 8 (eight) hours as needed for nausea or vomiting.     rosuvastatin 5 MG  tablet  Commonly known as:  CRESTOR  Take 5 mg by mouth at bedtime.     senna 8.6 MG tablet  Commonly known as:  SENOKOT  Take 1 tablet by mouth daily.     sodium chloride 0.65 % Soln nasal spray  Commonly known as:  OCEAN  Place 1 spray into both nostrils 4 (four) times daily.     SYSTANE BALANCE 0.6 % Soln  Generic drug:  Propylene Glycol  Apply 2 drops to eye 3 (three) times daily.     traMADol 50 MG tablet  Commonly known as:  ULTRAM  Take 1 tablet by mouth every 6 (six) hours as needed for moderate pain.     Vitamin D (Ergocalciferol) 50000 UNITS Caps capsule  Commonly known as:  DRISDOL  Take 50,000 Units by mouth every 30 (thirty) days.           Follow-up Information    Follow up with Lumi Winslett V, MD In 2 weeks.   Specialty:  Orthopedic Surgery   Contact information:   [address] [place] [address] [phone]       Signed: Nadara MustardDUDA,Serenna Deroy V 06/06/2015, 5:28 PM

## 2015-06-06 NOTE — Progress Notes (Signed)
Pt son Thayer OhmChris here and given her solver colored watch with multi clear stones, and silver colored ring with 3 clear stones.

## 2015-06-06 NOTE — Progress Notes (Signed)
Spoke with pt daughter Zonia KiefCathy Maddox and she gave telephone consent. I did try to call pt son Thayer OhmChris without success (his phone was not accepting messages). Lynden AngCathy states she will text Thayer OhmChris to call me.

## 2015-06-06 NOTE — Progress Notes (Signed)
Patient ID: [name], female   DOB: 07/06/1933, 81 y.o.   MRN: [id number] Status post left above-the-knee amputation just distal to her femoral hardware. Patient had very thin atrophic skin. Patient may discharge back to [name] facility. Prescriptions on the chart possible discharge Friday or Saturday

## 2015-06-07 ENCOUNTER — Encounter (HOSPITAL_COMMUNITY): Payer: Self-pay | Admitting: Orthopedic Surgery

## 2015-06-07 MED ORDER — CHLORHEXIDINE GLUCONATE CLOTH 2 % EX PADS
6.0000 | MEDICATED_PAD | Freq: Every day | CUTANEOUS | Status: DC
  Administered 2015-06-08: 6 via TOPICAL

## 2015-06-07 MED ORDER — MUPIROCIN 2 % EX OINT
1.0000 "application " | TOPICAL_OINTMENT | Freq: Two times a day (BID) | CUTANEOUS | Status: DC
  Administered 2015-06-07 (×2): 1 via NASAL
  Filled 2015-06-07: qty 22

## 2015-06-07 MED ORDER — PANTOPRAZOLE SODIUM 40 MG PO PACK
40.0000 mg | PACK | Freq: Every day | ORAL | Status: DC
  Administered 2015-06-07: 40 mg via ORAL
  Filled 2015-06-07: qty 20

## 2015-06-07 MED ORDER — FERROUS SULFATE 220 (44 FE) MG/5ML PO ELIX
220.0000 mg | ORAL_SOLUTION | Freq: Two times a day (BID) | ORAL | Status: DC
  Administered 2015-06-07: 220 mg via ORAL
  Filled 2015-06-07 (×4): qty 5

## 2015-06-07 MED ORDER — FERROUS SULFATE 220 (44 FE) MG/5ML PO ELIX
220.0000 mg | ORAL_SOLUTION | Freq: Two times a day (BID) | ORAL | Status: DC
  Filled 2015-06-07 (×3): qty 5

## 2015-06-07 MED ORDER — ASPIRIN 325 MG PO TABS
325.0000 mg | ORAL_TABLET | Freq: Every day | ORAL | Status: DC
  Administered 2015-06-07: 325 mg via ORAL
  Filled 2015-06-07 (×2): qty 1
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
## 2015-06-07 NOTE — Care Management Note (Signed)
Case Management Note  Patient Details  Name: [name] MRN: [id number] Date of Birth: 02/26/1934  Subjective/Objective:          S/p left AKA          Action/Plan: Patient from SNF, plan is to return to SNF. Referral made to CSW.   Expected Discharge Date:                  Expected Discharge Plan:  Skilled Nursing Facility  In-House Referral:  Clinical Social Work  Discharge planning Services  CM Consult  Post Acute Care Choice:  NA Choice offered to:     DME Arranged:    DME Agency:     HH Arranged:    HH Agency:     Status of Service:  Completed, signed off  Medicare Important Message Given:    Date Medicare IM Given:    Medicare IM give by:    Date Additional Medicare IM Given:    Additional Medicare Important Message give by:     If discussed at Long Length of Stay Meetings, dates discussed:    Additional Comments:  [name], [name] Bobst Watson, RN 06/07/2015, 12:56 PM

## 2015-06-07 NOTE — Evaluation (Signed)
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 PMHx: CVA, HTN, HLD, achalasia, nonverbal, nonambulatory, CAD, glaucoma, COPD   Clinical Impression   Per RN and PT eval pt was total assist with ADLs and functional mobility PTA. Pt is currently at baseline level. Pt from SNF ([name]) and planning to return to SNF upon d/c. Will defer all further OT needs to next venue of care. Thank you for this referral.     Follow Up Recommendations  SNF;Supervision/Assistance - 24 hour    Equipment Recommendations  Other (comment) (defer to SNF)    Recommendations for Other Services       Precautions / Restrictions Precautions Precautions: Fall Restrictions Weight Bearing Restrictions: Yes LLE Weight Bearing: Non weight bearing      Mobility Bed Mobility Overal bed mobility: Needs Assistance Bed Mobility: Rolling Rolling: Max assist   Supine to sit: Max assist     General bed mobility comments: Pt was able to reach and pull but needed max assist to roll to side. Max VC for sequencing and hand placement, unable to follow them consistently. Unable to sit without max assist. Pt was constantly pushing back during forward trunk elevation  Transfers                      Balance                                            ADL Overall ADL's : At baseline                                       General ADL Comments: Per PT eval and RN; pt total assist with ADLs PTA. Pt currently at baseline level for ADLs and functional mobility.     Vision     Perception     Praxis      Pertinent Vitals/Pain Pain Assessment: No/denies pain Faces Pain Scale: Hurts even more Pain Intervention(s): Monitored during session;Ice applied     Hand Dominance Right   Extremity/Trunk Assessment Upper Extremity Assessment Upper Extremity Assessment: Generalized weakness   Lower Extremity Assessment Lower Extremity Assessment: Defer to PT evaluation       Communication Communication Communication: Expressive difficulties   Cognition Arousal/Alertness: Awake/alert Behavior During Therapy: Flat affect Overall Cognitive Status: Impaired/Different from baseline Area of Impairment: Following commands       Following Commands: Follows one step commands inconsistently       General Comments: Per PT eval, her son stated that she cognition was impaired compared to before accident. She was able to communicate with him but now has mostly been nonverbal   General Comments       Exercises       Shoulder Instructions      Home Living Family/patient expects to be discharged to:: Skilled nursing facility                                 Additional Comments: Pt poor historian with inconsistent responses to questions  Prior Functioning/Environment Level of Independence: Needs assistance  Gait / Transfers Assistance Needed: Per PT eval, son reports pt non ambulatory for several years, staff use hoyer lift for OOB to [facility] and toilet ADL's / Homemaking Assistance Needed: Per PT eval and RN, total assist for bathing, dressing, homemaking Communication / Swallowing Assistance Needed: pt normally coherent and talkative per son but impaired acutely with pain medicine, honey thick liquids at [facility]      OT Diagnosis: Generalized weakness;Cognitive deficits;Acute pain   OT Problem List:     OT Treatment/Interventions:      OT Goals(Current goals can be found in the care plan section) Acute Rehab OT Goals Patient Stated Goal: none stated OT Goal Formulation: Patient unable to participate in goal setting  OT Frequency:     Barriers to D/C:            Co-evaluation              End of Session    Activity Tolerance:  Patient tolerated treatment well Patient left: in bed;with call bell/phone within reach   Time: 1425-1435 OT Time Calculation (min): 10 min Charges:  OT General Charges $OT Visit: 1 Procedure OT Evaluation $Initial OT Evaluation Tier I: 1 Procedure G-Codes:     Gaye Alken M.S., OTR/L Pager: [phone]  06/07/2015, 2:54 PM

## 2015-06-07 NOTE — Progress Notes (Signed)
Paged MD at [phone] to address thicken liquid diet. No return answer from MD. Will continue to assess pt.

## 2015-06-07 NOTE — Clinical Social Work Note (Signed)
Clinical Social Work Assessment  Patient Details  Name: [name] BilisCarolyn A [name] MRN: [id number] Date of Birth: 08/28/1933  Date of referral:  06/07/15               Reason for consult:  Facility Placement                Permission sought to share information with:  Family Supports, Oceanographeracility Contact Representative Permission granted to share information::  Yes, Verbal Permission Granted  Name::     Dahlia Byeshris Grogan, patient's son  Agency::  [name] admissions  Relationship::     Contact Information:     Housing/Transportation Living arrangements for the past 2 months:  Skilled Nursing Facility Source of Information:  Adult Children Patient Interpreter Needed:  None Criminal Activity/Legal Involvement Pertinent to Current Situation/Hospitalization:  No - Comment as needed Significant Relationships:  Adult Children Lives with:  Facility Resident Do you feel safe going back to the place where you live?  Yes Need for family participation in patient care:  Yes (Comment)  Care giving concerns:  Patient and her son do not have any concerns [facility]   Social Worker assessment / plan:  Patient is a 81 year old female who [facility]  [facility] and the family would like her to return back to facility.  Patient does not talk very much, due to being drowsy from medications according to patient's son.  Patient's son reports that patient is usually quite talkative.  Patient's son reports that patient has several people that she socializes with at SNF and she expresses happiness being there.  Patient's son stated they are holding patient's bed so she can return back to facility.  Patient's son asked if patient will need a 3 day qualifying stay before she can return to SNF, CSW explained that since she has different insurance she does not need a 3 day stay.  Patient's son was asked if he had any questions and patient's son said  no.  Employment status:  Retired Database administratornsurance information:  Managed Medicare PT Recommendations:  Skilled Nursing Facility Information / Referral to community resources:     Patient/Family's Response to care:  Patient and son are in agreement to [facility].  Patient/Family's Understanding of and Emotional Response to Diagnosis, Current Treatment, and Prognosis:  Patient does not seem to be aware of current prognosis but patient's son is aware.  Emotional Assessment Appearance:  Appears stated age Attitude/Demeanor/Rapport:    Affect (typically observed):  Appropriate, Calm Orientation:  Oriented to Self Alcohol / Substance use:  Not Applicable Psych involvement (Current and /or in the community):  No (Comment)  Discharge Needs  Concerns to be addressed:  No discharge needs identified Readmission within the last 30 days:  No Current discharge risk:  None Barriers to Discharge:  No Barriers Identified   Darleene Cleavernterhaus, Emonii Wienke R, LCSWA 06/07/2015, 10:52 PM

## 2015-06-07 NOTE — NC FL2 (Signed)
[place] MEDICAID FL2 LEVEL OF CARE SCREENING TOOL     IDENTIFICATION  Patient Name: [name] Birthdate: January 17, 1934 Sex: female Admission Date (Current Location): 06/06/2015  [facility] and [place] Number:  Producer, television/film/video and Address:  [facility], [address], [place], [place] [address]      Provider Number: [id number]  Attending Physician Name and Address:  Nadara Mustard, MD  Relative Name and Phone Number:       Current Level of Care: Hospital Recommended Level of Care: Skilled Nursing Facility Prior Approval Number:    Date Approved/Denied:   PASRR Number: [id number]  Discharge Plan: SNF    Current Diagnoses: Patient Active Problem List   Diagnosis Date Noted  . Gangrene of foot (HCC) 06/06/2015  . Acute respiratory failure with hypoxia (HCC) 10/25/2014  . Aspiration pneumonia (HCC) 10/25/2014  . Cough   . Heme + stool   . Absolute anemia   . Heme positive stool 09/14/2014  . CN (constipation) 09/14/2014  . Malnutrition of moderate degree (HCC) 09/01/2014  . Anemia 08/31/2014  . Achalasia -suspected by hx/ba swallow 01/11/2014  . Dysphagia 12/23/2013  . Hip fracture (HCC) 08/29/2011  . Occlusion and stenosis of carotid artery without mention of cerebral infarction 07/17/2011  . Abdominal aortic aneurysm (HCC) 06/06/2011  . Carotid stenosis, bilateral 06/06/2011  . Dyslipidemia 05/01/2011  . History of transient ischemic attack (TIA) 05/01/2011  . Neuropathy (HCC) 05/01/2011  . Rectal bleeding 04/30/2011    Orientation RESPIRATION BLADDER Height & Weight    Self  O2 Incontinent  (165.1 cm) 161 lbs.  BEHAVIORAL SYMPTOMS/MOOD NEUROLOGICAL BOWEL NUTRITION STATUS      Continent    AMBULATORY STATUS COMMUNICATION OF NEEDS Skin   Extensive Assist Verbally Surgical wounds, PU Stage and Appropriate Care       PU Stage 4 Dressing: Daily               Personal Care Assistance Level of Assistance  Bathing,  Feeding, Dressing Bathing Assistance: Maximum assistance Feeding assistance: Limited assistance Dressing Assistance: Maximum assistance     Functional Limitations Info             SPECIAL CARE FACTORS FREQUENCY  PT (By licensed PT)     PT Frequency: 3x a week              Contractures      Additional Factors Info  Code Status, Allergies Code Status Info: Full Code Allergies Info: CHOCOLATE, AMINOGLYCOSIDES, CODEINE, ERYTHROMYCIN, MACROLIDES AND KETOLIDES, PENICILLINS, VANCOMYCIN           Current Medications (06/07/2015):  This is the current hospital active medication list Current Facility-Administered Medications  Medication Dose Route Frequency Provider Last Rate Last Dose  . 0.9 %  sodium chloride infusion   Intravenous Continuous Aldean Baker V, MD      . acetaminophen (TYLENOL) tablet 650 mg  650 mg Oral Q6H PRN Nadara Mustard, MD       Or  . acetaminophen (TYLENOL) suppository 650 mg  650 mg Rectal Q6H PRN Nadara Mustard, MD      . amLODipine (NORVASC) tablet 2.5 mg  2.5 mg Oral Daily Nadara Mustard, MD   2.5 mg at 06/07/15 1240  . aspirin tablet 325 mg  325 mg Oral Daily Nadara Mustard, MD   325 mg at 06/07/15 1239  . bisacodyl (DULCOLAX) EC tablet 5 mg  5 mg Oral QPM Aldean Baker  V, MD   5 mg at 06/06/15 1900  . [START ON 06/08/2015] Chlorhexidine Gluconate Cloth 2 % PADS 6 each  6 each Topical Q0600 Nadara MustardMarcus Duda V, MD      . escitalopram (LEXAPRO) tablet 10 mg  10 mg Oral Daily Nadara MustardMarcus Duda V, MD   10 mg at 06/07/15 1238  . ferrous sulfate 220 (44 FE) MG/5ML solution 220 mg  220 mg Oral BID WC Nadara MustardMarcus Duda V, MD   220 mg at 06/07/15 1653  . HYDROcodone-acetaminophen (NORCO/VICODIN) 5-325 MG per tablet 1-2 tablet  1-2 tablet Oral Q4H PRN Nadara MustardMarcus Duda V, MD   2 tablet at 06/07/15 1252  . HYDROmorphone (DILAUDID) injection 0.5 mg  0.5 mg Intravenous Q2H PRN Nadara MustardMarcus Duda V, MD   0.5 mg at 06/06/15 2120  . hydrOXYzine (ATARAX/VISTARIL) tablet 25 mg  25 mg Oral TID PRN Nadara MustardMarcus  Duda V, MD      . ipratropium-albuterol (DUONEB) 0.5-2.5 (3) MG/3ML nebulizer solution 3 mL  3 mL Nebulization Q6H PRN Nadara MustardMarcus Duda V, MD      . lactated ringers infusion   Intravenous Continuous Cecile HearingStephen Edward Turk, MD      . methocarbamol (ROBAXIN) tablet 500 mg  500 mg Oral Q6H PRN Nadara MustardMarcus Duda V, MD       Or  . methocarbamol (ROBAXIN) 500 mg in dextrose 5 % 50 mL IVPB  500 mg Intravenous Q6H PRN Nadara MustardMarcus Duda V, MD      . metoCLOPramide (REGLAN) tablet 5-10 mg  5-10 mg Oral Q8H PRN Nadara MustardMarcus Duda V, MD       Or  . metoCLOPramide (REGLAN) injection 5-10 mg  5-10 mg Intravenous Q8H PRN Nadara MustardMarcus Duda V, MD      . mupirocin ointment (BACTROBAN) 2 % 1 application  1 application Nasal BID Nadara MustardMarcus Duda V, MD   1 application at 06/07/15 1648  . omega-3 acid ethyl esters (LOVAZA) capsule 2 g  2 g Oral Daily Nadara MustardMarcus Duda V, MD   2 g at 06/07/15 1257  . ondansetron (ZOFRAN) tablet 4 mg  4 mg Oral Q6H PRN Nadara MustardMarcus Duda V, MD       Or  . ondansetron [facility](ZOFRAN) injection 4 mg  4 mg Intravenous Q6H PRN Nadara MustardMarcus Duda V, MD      . pantoprazole sodium (PROTONIX) 40 mg/20 mL oral suspension 40 mg  40 mg Oral Daily Nadara MustardMarcus Duda V, MD   40 mg at 06/07/15 1238  . polyethylene glycol (MIRALAX / GLYCOLAX) packet 17 g  17 g Oral QODAY Nadara MustardMarcus Duda V, MD   17 g at 06/07/15 1240  . polyvinyl alcohol (LIQUIFILM TEARS) 1.4 % ophthalmic solution 2 drop  2 drop Both Eyes TID Nadara MustardMarcus Duda V, MD   2 drop at 06/07/15 1654  . pregabalin (LYRICA) capsule 75 mg  75 mg Oral TID Nadara MustardMarcus Duda V, MD   75 mg at 06/07/15 1650  . promethazine (PHENERGAN) suppository 25 mg  25 mg Rectal Q8H PRN Nadara MustardMarcus Duda V, MD      . rosuvastatin (CRESTOR) tablet 5 mg  5 mg Oral QHS Nadara MustardMarcus Duda V, MD   5 mg at 06/06/15 2107  . senna (SENOKOT) tablet 8.6 mg  1 tablet Oral Daily Aldean BakerMarcus Duda V, MD   8.6 mg at 06/07/15 1240  . traMADol (ULTRAM) tablet 50 mg  50 mg Oral Q6H PRN Nadara MustardMarcus Duda V, MD         Discharge Medications: Please see discharge summary for a list  of  discharge medications.  Relevant Imaging Results:  Relevant Lab Results:   Additional Information SSN [id number]  Darleene Cleaver, [place]

## 2015-06-07 NOTE — Progress Notes (Signed)
Patient resting comfortably in bed. Nonverbal. Thickened liquid diet is fine SNF pending - appreciate SW help  N. Glee ArvinMichael Xu, MD [facility] [phone] 7:38 AM

## 2015-06-07 NOTE — Progress Notes (Signed)
Utilization review completed.  

## 2015-06-07 NOTE — Clinical Social Work Note (Addendum)
CSW spoke with [name] who said they can take patient once she is medically ready and discharge orders have been received awaiting PT and OT notes.  CSW to continue to follow patient's progress throughout discharge planning.  Ervin KnackEric R. Maren Wiesen, MSW, LCSWA [phone] 06/07/2015 11:21 AM

## 2015-06-08 MED ORDER — RESOURCE THICKENUP CLEAR PO POWD
ORAL | Status: DC | PRN
Start: 2015-06-08 — End: 2015-06-08
  Filled 2015-06-08: qty 125

## 2015-06-08 NOTE — Progress Notes (Signed)
Pt ready for discharge. [facility] All questions and concerns addressed. Pt will be transported out via [facility] on a stretcher. Will continue to monitor

## 2015-06-08 NOTE — Progress Notes (Signed)
Patient is stable for dc to SNF from ortho standpoint F/u 2 weeks Lajoyce CornersDuda Rx in chart  N. Glee ArvinMichael Xu, MD [facility] [phone] 7:29 AM

## 2015-06-08 NOTE — Clinical Social Work Note (Signed)
Clinical Social Worker facilitated patient discharge including contacting patient family and facility to confirm patient discharge plans.  Clinical information faxed to facility and family agreeable with plan.  [facility]  RN to call report prior to discharge.  Clinical Social Worker will sign off for now as social work intervention is no longer needed. Please consult us again if new need arises.  Derenda FennelBashira Duquan Gillooly, MSW, LCSWA [phone] 06/08/2015 11:17 AM

## 2015-06-13 ENCOUNTER — Other Ambulatory Visit (HOSPITAL_COMMUNITY): Payer: Self-pay | Admitting: Orthopedic Surgery

## 2015-06-19 MED ORDER — CLINDAMYCIN PHOSPHATE 900 MG/50ML IV SOLN
900.0000 mg | INTRAVENOUS | Status: AC
  Administered 2015-06-20: 900 mg via INTRAVENOUS
  Filled 2015-06-19: qty 50

## 2015-06-19 MED ORDER — CHLORHEXIDINE GLUCONATE 4 % EX LIQD: 60.0000 mL | Freq: Once | CUTANEOUS | Status: DC

## 2015-06-19 NOTE — Progress Notes (Signed)
[name]  I spoke with patient's nurse Lennox LaityJodi, who said that patient is requiring more assistance last surgery, patient has to be fed now.  Patient remains alert and oriented, ? a liitle depressed.  [name] takes medications with purred food or thuckened liquid so she will not have any mediations after midnight.

## 2015-06-20 ENCOUNTER — Ambulatory Visit (HOSPITAL_COMMUNITY): Payer: Medicare Other | Admitting: Certified Registered"

## 2015-06-20 ENCOUNTER — Observation Stay (HOSPITAL_COMMUNITY)
Admission: RE | Admit: 2015-06-20 | Discharge: 2015-06-21 | Disposition: A | Discharge status: Skilled Nursing Facility | Payer: Medicare Other | Source: Ambulatory Visit | Attending: Orthopedic Surgery | Admitting: Orthopedic Surgery

## 2015-06-20 ENCOUNTER — Encounter (HOSPITAL_COMMUNITY): Payer: Self-pay | Admitting: Certified Registered"

## 2015-06-20 ENCOUNTER — Encounter (HOSPITAL_COMMUNITY)
Admission: RE | Disposition: A | Discharge status: Skilled Nursing Facility | Payer: Self-pay | Source: Ambulatory Visit | Attending: Orthopedic Surgery

## 2015-06-20 DIAGNOSIS — F329 Major depressive disorder, single episode, unspecified: Secondary | ICD-10-CM | POA: Insufficient documentation

## 2015-06-20 DIAGNOSIS — T8781 Dehiscence of amputation stump: Secondary | ICD-10-CM | POA: Diagnosis not present

## 2015-06-20 DIAGNOSIS — Y835 Amputation of limb(s) as the cause of abnormal reaction of the patient, or of later complication, without mention of misadventure at the time of the procedure: Secondary | ICD-10-CM | POA: Insufficient documentation

## 2015-06-20 DIAGNOSIS — I2510 Atherosclerotic heart disease of native coronary artery without angina pectoris: Secondary | ICD-10-CM | POA: Insufficient documentation

## 2015-06-20 DIAGNOSIS — Z905 Acquired absence of kidney: Secondary | ICD-10-CM | POA: Insufficient documentation

## 2015-06-20 DIAGNOSIS — J449 Chronic obstructive pulmonary disease, unspecified: Secondary | ICD-10-CM | POA: Insufficient documentation

## 2015-06-20 DIAGNOSIS — Z79899 Other long term (current) drug therapy: Secondary | ICD-10-CM | POA: Diagnosis not present

## 2015-06-20 DIAGNOSIS — Z472 Encounter for removal of internal fixation device: Secondary | ICD-10-CM | POA: Diagnosis not present

## 2015-06-20 DIAGNOSIS — I10 Essential (primary) hypertension: Secondary | ICD-10-CM | POA: Insufficient documentation

## 2015-06-20 DIAGNOSIS — I714 Abdominal aortic aneurysm, without rupture: Secondary | ICD-10-CM | POA: Diagnosis not present

## 2015-06-20 DIAGNOSIS — M1990 Unspecified osteoarthritis, unspecified site: Secondary | ICD-10-CM | POA: Diagnosis not present

## 2015-06-20 DIAGNOSIS — Z7982 Long term (current) use of aspirin: Secondary | ICD-10-CM | POA: Insufficient documentation

## 2015-06-20 DIAGNOSIS — E7800 Pure hypercholesterolemia, unspecified: Secondary | ICD-10-CM | POA: Insufficient documentation

## 2015-06-20 DIAGNOSIS — S78112A Complete traumatic amputation at level between left hip and knee, initial encounter: Secondary | ICD-10-CM

## 2015-06-20 DIAGNOSIS — Z87891 Personal history of nicotine dependence: Secondary | ICD-10-CM | POA: Insufficient documentation

## 2015-06-20 DIAGNOSIS — I96 Gangrene, not elsewhere classified: Secondary | ICD-10-CM | POA: Diagnosis not present

## 2015-06-20 DIAGNOSIS — Z8673 Personal history of transient ischemic attack (TIA), and cerebral infarction without residual deficits: Secondary | ICD-10-CM | POA: Insufficient documentation

## 2015-06-20 DIAGNOSIS — G629 Polyneuropathy, unspecified: Secondary | ICD-10-CM | POA: Diagnosis not present

## 2015-06-20 DIAGNOSIS — J84112 Idiopathic pulmonary fibrosis: Secondary | ICD-10-CM | POA: Insufficient documentation

## 2015-06-20 DIAGNOSIS — F419 Anxiety disorder, unspecified: Secondary | ICD-10-CM | POA: Insufficient documentation

## 2015-06-20 DIAGNOSIS — Z89612 Acquired absence of left leg above knee: Secondary | ICD-10-CM | POA: Diagnosis not present

## 2015-06-20 HISTORY — PX: STUMP REVISION: SHX6102

## 2015-06-20 HISTORY — PX: HARDWARE REMOVAL: SHX979

## 2015-06-20 LAB — COMPREHENSIVE METABOLIC PANEL
ALBUMIN: 1.9 g/dL — AB (ref 3.5–5.0)
ALT: 18 U/L (ref 14–54)
AST: 29 U/L (ref 15–41)
Alkaline Phosphatase: 120 U/L (ref 38–126)
Anion gap: 8 (ref 5–15)
BILIRUBIN TOTAL: 0.4 mg/dL (ref 0.3–1.2)
BUN: 25 mg/dL — AB (ref 6–20)
CALCIUM: 9.1 mg/dL (ref 8.9–10.3)
CO2: 29 mmol/L (ref 22–32)
CREATININE: 1.16 mg/dL — AB (ref 0.44–1.00)
Chloride: 102 mmol/L (ref 101–111)
GFR, EST AFRICAN AMERICAN: 51 mL/min — AB (ref >60)
GFR, EST NON AFRICAN AMERICAN: 44 mL/min — AB (ref >60)
Glucose, Bld: 78 mg/dL (ref 65–99)
Potassium: 5.1 mmol/L (ref 3.5–5.1)
SODIUM: 139 mmol/L (ref 135–145)
TOTAL PROTEIN: 6.6 g/dL (ref 6.5–8.1)

## 2015-06-20 LAB — CBC
HCT: 26.8 % — ABNORMAL LOW (ref 36.0–46.0)
Hemoglobin: 8.1 g/dL — ABNORMAL LOW (ref 12.0–15.0)
MCH: 26.9 pg (ref 26.0–34.0)
MCHC: 30.2 g/dL (ref 30.0–36.0)
MCV: 89 fL (ref 78.0–100.0)
Platelets: 357 10*3/uL (ref 150–400)
RBC: 3.01 MIL/uL — ABNORMAL LOW (ref 3.87–5.11)
RDW: 15.7 % — AB (ref 11.5–15.5)
WBC: 9 10*3/uL (ref 4.0–10.5)

## 2015-06-20 LAB — APTT: APTT: 27 s (ref 24–37)

## 2015-06-20 LAB — PROTIME-INR
INR: 1.11 (ref 0.00–1.49)
PROTHROMBIN TIME: 14.5 s (ref 11.6–15.2)

## 2015-06-20 LAB — GLUCOSE, CAPILLARY: GLUCOSE-CAPILLARY: 96 mg/dL (ref 65–99)

## 2015-06-20 SURGERY — REVISION, AMPUTATION SITE
Anesthesia: General | Laterality: Left

## 2015-06-20 MED ORDER — POLYVINYL ALCOHOL 1.4 % OP SOLN
2.0000 [drp] | Freq: Three times a day (TID) | OPHTHALMIC | Status: DC
  Administered 2015-06-21 (×2): 2 [drp] via OPHTHALMIC
  Filled 2015-06-20: qty 15

## 2015-06-20 MED ORDER — PROPOFOL 10 MG/ML IV BOLUS
INTRAVENOUS | Status: AC
  Filled 2015-06-20: qty 20

## 2015-06-20 MED ORDER — LACTATED RINGERS IV BOLUS (SEPSIS)
250.0000 mL | Freq: Once | INTRAVENOUS | Status: AC
  Administered 2015-06-20: 250 mL via INTRAVENOUS

## 2015-06-20 MED ORDER — MUPIROCIN 2 % EX OINT
1.0000 "application " | TOPICAL_OINTMENT | Freq: Two times a day (BID) | CUTANEOUS | Status: DC
  Administered 2015-06-21 (×2): 1 via NASAL
  Filled 2015-06-20 (×3): qty 22

## 2015-06-20 MED ORDER — RESOURCE THICKENUP CLEAR PO POWD
ORAL | Status: DC | PRN
  Filled 2015-06-20: qty 125

## 2015-06-20 MED ORDER — CLINDAMYCIN PHOSPHATE 600 MG/50ML IV SOLN
600.0000 mg | Freq: Four times a day (QID) | INTRAVENOUS | Status: AC
  Administered 2015-06-20 – 2015-06-21 (×3): 600 mg via INTRAVENOUS
  Filled 2015-06-20 (×4): qty 50

## 2015-06-20 MED ORDER — LIDOCAINE HCL (CARDIAC) 20 MG/ML IV SOLN
INTRAVENOUS | Status: AC
  Filled 2015-06-20: qty 5

## 2015-06-20 MED ORDER — LACTATED RINGERS IV SOLN
INTRAVENOUS | Status: DC | PRN
  Administered 2015-06-20: 08:00:00 via INTRAVENOUS

## 2015-06-20 MED ORDER — ROSUVASTATIN CALCIUM 5 MG PO TABS
5.0000 mg | ORAL_TABLET | Freq: Every day | ORAL | Status: DC
  Administered 2015-06-20: 5 mg via ORAL
  Filled 2015-06-20: qty 1

## 2015-06-20 MED ORDER — METHOCARBAMOL 1000 MG/10ML IJ SOLN: 500.0000 mg | Freq: Four times a day (QID) | INTRAMUSCULAR | Status: DC | PRN

## 2015-06-20 MED ORDER — SODIUM CHLORIDE 0.9 % IJ SOLN
INTRAMUSCULAR | Status: AC
  Filled 2015-06-20: qty 10

## 2015-06-20 MED ORDER — HYDROCODONE-ACETAMINOPHEN 5-325 MG PO TABS
1.0000 | ORAL_TABLET | ORAL | Status: DC | PRN
  Administered 2015-06-21 (×2): 1 via ORAL
  Filled 2015-06-20 (×2): qty 1

## 2015-06-20 MED ORDER — ALBUMIN HUMAN 5 % IV SOLN
12.5000 g | Freq: Once | INTRAVENOUS | Status: AC
  Administered 2015-06-20: 12.5 g via INTRAVENOUS
  Filled 2015-06-20: qty 250

## 2015-06-20 MED ORDER — ONDANSETRON HCL 4 MG/2ML IJ SOLN
INTRAMUSCULAR | Status: AC
  Filled 2015-06-20: qty 2

## 2015-06-20 MED ORDER — METOCLOPRAMIDE HCL 5 MG PO TABS: 5.0000 mg | ORAL_TABLET | Freq: Three times a day (TID) | ORAL | Status: DC | PRN

## 2015-06-20 MED ORDER — MIDAZOLAM HCL 2 MG/2ML IJ SOLN
INTRAMUSCULAR | Status: AC
  Filled 2015-06-20: qty 2

## 2015-06-20 MED ORDER — FENTANYL CITRATE (PF) 100 MCG/2ML IJ SOLN
INTRAMUSCULAR | Status: AC
  Administered 2015-06-20: 25 ug
  Filled 2015-06-20: qty 2

## 2015-06-20 MED ORDER — OXYCODONE HCL ER 10 MG PO T12A
10.0000 mg | EXTENDED_RELEASE_TABLET | Freq: Two times a day (BID) | ORAL | Status: DC
  Administered 2015-06-21 (×2): 10 mg via ORAL
  Filled 2015-06-20 (×2): qty 1

## 2015-06-20 MED ORDER — BISACODYL 5 MG PO TBEC
5.0000 mg | DELAYED_RELEASE_TABLET | Freq: Every evening | ORAL | Status: DC
  Administered 2015-06-20: 5 mg via ORAL
  Filled 2015-06-20: qty 1

## 2015-06-20 MED ORDER — HYDROCODONE-ACETAMINOPHEN 5-325 MG PO TABS
1.0000 | ORAL_TABLET | ORAL | Status: DC | PRN
  Administered 2015-06-20: 2 via ORAL
  Filled 2015-06-20: qty 2

## 2015-06-20 MED ORDER — EPHEDRINE SULFATE 50 MG/ML IJ SOLN
INTRAMUSCULAR | Status: DC | PRN
  Administered 2015-06-20 (×2): 15 mg via INTRAVENOUS

## 2015-06-20 MED ORDER — SODIUM CHLORIDE 0.9 % IV SOLN: INTRAVENOUS | Status: DC

## 2015-06-20 MED ORDER — FENTANYL CITRATE (PF) 250 MCG/5ML IJ SOLN
INTRAMUSCULAR | Status: DC | PRN
  Administered 2015-06-20: 100 ug via INTRAVENOUS
  Administered 2015-06-20: 25 ug via INTRAVENOUS

## 2015-06-20 MED ORDER — PROPOFOL 10 MG/ML IV BOLUS
INTRAVENOUS | Status: DC | PRN
  Administered 2015-06-20: 90 mg via INTRAVENOUS

## 2015-06-20 MED ORDER — PHENYLEPHRINE 40 MCG/ML (10ML) SYRINGE FOR IV PUSH (FOR BLOOD PRESSURE SUPPORT)
PREFILLED_SYRINGE | INTRAVENOUS | Status: AC
  Filled 2015-06-20: qty 10

## 2015-06-20 MED ORDER — ONDANSETRON HCL 4 MG/2ML IJ SOLN: 4.0000 mg | Freq: Four times a day (QID) | INTRAMUSCULAR | Status: DC | PRN

## 2015-06-20 MED ORDER — CHLORHEXIDINE GLUCONATE CLOTH 2 % EX PADS
6.0000 | MEDICATED_PAD | Freq: Every day | CUTANEOUS | Status: DC
  Administered 2015-06-21: 6 via TOPICAL

## 2015-06-20 MED ORDER — ESCITALOPRAM OXALATE 10 MG PO TABS
10.0000 mg | ORAL_TABLET | Freq: Every day | ORAL | Status: DC
  Administered 2015-06-21: 10 mg via ORAL
  Filled 2015-06-20: qty 1

## 2015-06-20 MED ORDER — PREGABALIN 75 MG PO CAPS
75.0000 mg | ORAL_CAPSULE | Freq: Three times a day (TID) | ORAL | Status: DC
  Administered 2015-06-20 – 2015-06-21 (×3): 75 mg via ORAL
  Filled 2015-06-20 (×3): qty 1

## 2015-06-20 MED ORDER — ONDANSETRON HCL 4 MG/2ML IJ SOLN: 4.0000 mg | Freq: Once | INTRAMUSCULAR | Status: DC | PRN

## 2015-06-20 MED ORDER — ONDANSETRON HCL 4 MG PO TABS: 4.0000 mg | ORAL_TABLET | Freq: Four times a day (QID) | ORAL | Status: DC | PRN

## 2015-06-20 MED ORDER — PANCRELIPASE (LIP-PROT-AMYL) 12000-38000 UNITS PO CPEP
24000.0000 [IU] | ORAL_CAPSULE | Freq: Three times a day (TID) | ORAL | Status: DC
  Administered 2015-06-21: 24000 [IU] via ORAL
  Filled 2015-06-20 (×6): qty 2

## 2015-06-20 MED ORDER — PHENYLEPHRINE HCL 10 MG/ML IJ SOLN
INTRAMUSCULAR | Status: DC | PRN
  Administered 2015-06-20: 80 ug via INTRAVENOUS
  Administered 2015-06-20: 120 ug via INTRAVENOUS
  Administered 2015-06-20: 200 ug via INTRAVENOUS

## 2015-06-20 MED ORDER — AMLODIPINE BESYLATE 2.5 MG PO TABS: 2.5000 mg | ORAL_TABLET | Freq: Every day | ORAL | Status: DC

## 2015-06-20 MED ORDER — MORPHINE SULFATE (PF) 2 MG/ML IV SOLN: 1.0000 mg | INTRAVENOUS | Status: DC | PRN

## 2015-06-20 MED ORDER — HYDROMORPHONE HCL 1 MG/ML IJ SOLN
0.5000 mg | INTRAMUSCULAR | Status: DC | PRN
  Administered 2015-06-20 (×2): 0.5 mg via INTRAVENOUS
  Filled 2015-06-20 (×2): qty 1

## 2015-06-20 MED ORDER — EPHEDRINE SULFATE 50 MG/ML IJ SOLN
INTRAMUSCULAR | Status: AC
  Filled 2015-06-20: qty 1

## 2015-06-20 MED ORDER — ACETAMINOPHEN 650 MG RE SUPP: 650.0000 mg | Freq: Four times a day (QID) | RECTAL | Status: DC | PRN

## 2015-06-20 MED ORDER — PANTOPRAZOLE SODIUM 40 MG PO TBEC
40.0000 mg | DELAYED_RELEASE_TABLET | Freq: Every day | ORAL | Status: DC
  Administered 2015-06-21: 40 mg via ORAL
  Filled 2015-06-20: qty 1

## 2015-06-20 MED ORDER — ALBUMIN HUMAN 5 % IV SOLN
INTRAVENOUS | Status: AC
  Filled 2015-06-20: qty 500

## 2015-06-20 MED ORDER — IPRATROPIUM-ALBUTEROL 0.5-2.5 (3) MG/3ML IN SOLN: 3.0000 mL | Freq: Four times a day (QID) | RESPIRATORY_TRACT | Status: DC | PRN

## 2015-06-20 MED ORDER — ONDANSETRON HCL 4 MG/2ML IJ SOLN
INTRAMUSCULAR | Status: DC | PRN
  Administered 2015-06-20: 4 mg via INTRAVENOUS

## 2015-06-20 MED ORDER — FENTANYL CITRATE (PF) 250 MCG/5ML IJ SOLN
INTRAMUSCULAR | Status: AC
  Filled 2015-06-20: qty 5

## 2015-06-20 MED ORDER — STARCH (THICKENING) PO POWD
ORAL | Status: DC | PRN
  Filled 2015-06-20: qty 227

## 2015-06-20 MED ORDER — SUCCINYLCHOLINE CHLORIDE 20 MG/ML IJ SOLN
INTRAMUSCULAR | Status: AC
  Filled 2015-06-20: qty 1

## 2015-06-20 MED ORDER — FENTANYL CITRATE (PF) 100 MCG/2ML IJ SOLN
25.0000 ug | INTRAMUSCULAR | Status: DC | PRN
  Administered 2015-06-20 (×2): 25 ug via INTRAVENOUS

## 2015-06-20 MED ORDER — ASPIRIN EC 325 MG PO TBEC: 325.0000 mg | DELAYED_RELEASE_TABLET | ORAL | Status: DC

## 2015-06-20 MED ORDER — SUCCINYLCHOLINE CHLORIDE 20 MG/ML IJ SOLN
INTRAMUSCULAR | Status: DC | PRN
  Administered 2015-06-20: 100 mg via INTRAVENOUS

## 2015-06-20 MED ORDER — LIDOCAINE HCL (CARDIAC) 20 MG/ML IV SOLN
INTRAVENOUS | Status: DC | PRN
  Administered 2015-06-20: 30 mg via INTRAVENOUS

## 2015-06-20 MED ORDER — METOCLOPRAMIDE HCL 5 MG/ML IJ SOLN: 5.0000 mg | Freq: Three times a day (TID) | INTRAMUSCULAR | Status: DC | PRN

## 2015-06-20 MED ORDER — SENNA 8.6 MG PO TABS
1.0000 | ORAL_TABLET | Freq: Every day | ORAL | Status: DC
  Administered 2015-06-21: 8.6 mg via ORAL
  Filled 2015-06-20: qty 1

## 2015-06-20 MED ORDER — ACETAMINOPHEN 325 MG PO TABS: 650.0000 mg | ORAL_TABLET | Freq: Four times a day (QID) | ORAL | Status: DC | PRN

## 2015-06-20 MED ORDER — CEFAZOLIN SODIUM 1-5 GM-% IV SOLN
1.0000 g | Freq: Four times a day (QID) | INTRAVENOUS | Status: DC
Start: 2015-06-20 — End: 2015-06-20

## 2015-06-20 MED ORDER — METHOCARBAMOL 500 MG PO TABS: 500.0000 mg | ORAL_TABLET | Freq: Four times a day (QID) | ORAL | Status: DC | PRN

## 2015-06-20 MED ORDER — ALBUMIN HUMAN 5 % IV SOLN
12.5000 g | Freq: Once | INTRAVENOUS | Status: AC
  Administered 2015-06-20: 12.5 g via INTRAVENOUS

## 2015-06-20 SURGICAL SUPPLY — 66 items
BANDAGE ESMARK 6X9 LF (GAUZE/BANDAGES/DRESSINGS) IMPLANT
BLADE SAW RECIP 87.9 MT (BLADE) IMPLANT
BNDG COHESIVE 4X5 TAN STRL (GAUZE/BANDAGES/DRESSINGS) IMPLANT
BNDG COHESIVE 6X5 TAN STRL LF (GAUZE/BANDAGES/DRESSINGS) IMPLANT
BNDG ESMARK 6X9 LF (GAUZE/BANDAGES/DRESSINGS)
BNDG GAUZE ELAST 4 BULKY (GAUZE/BANDAGES/DRESSINGS) ×3 IMPLANT
BNDG GAUZE STRTCH 6 (GAUZE/BANDAGES/DRESSINGS) IMPLANT
COVER SURGICAL LIGHT HANDLE (MISCELLANEOUS) ×6 IMPLANT
CUFF TOURNIQUET SINGLE 34IN LL (TOURNIQUET CUFF) IMPLANT
CUFF TOURNIQUET SINGLE 44IN (TOURNIQUET CUFF) IMPLANT
DRAIN PENROSE 1/2X12 LTX STRL (WOUND CARE) IMPLANT
DRAPE C-ARM 42X72 X-RAY (DRAPES) IMPLANT
DRAPE EXTREMITY T 121X128X90 (DRAPE) ×3 IMPLANT
DRAPE INCISE IOBAN 66X45 STRL (DRAPES) IMPLANT
DRAPE ORTHO SPLIT 77X108 STRL (DRAPES)
DRAPE PROXIMA HALF (DRAPES) ×6 IMPLANT
DRAPE SURG ORHT 6 SPLT 77X108 (DRAPES) IMPLANT
DRAPE U-SHAPE 47X51 STRL (DRAPES) ×6 IMPLANT
DRSG ADAPTIC 3X8 NADH LF (GAUZE/BANDAGES/DRESSINGS) ×3 IMPLANT
DRSG AQUACEL AG ADV 3.5X10 (GAUZE/BANDAGES/DRESSINGS) ×6 IMPLANT
DRSG EMULSION OIL 3X3 NADH (GAUZE/BANDAGES/DRESSINGS) ×3 IMPLANT
DRSG PAD ABDOMINAL 8X10 ST (GAUZE/BANDAGES/DRESSINGS) ×3 IMPLANT
DURAPREP 26ML APPLICATOR (WOUND CARE) ×3 IMPLANT
ELECT CAUTERY BLADE 6.4 (BLADE) ×3 IMPLANT
ELECT REM PT RETURN 9FT ADLT (ELECTROSURGICAL) ×3
ELECTRODE REM PT RTRN 9FT ADLT (ELECTROSURGICAL) ×1 IMPLANT
EVACUATOR 1/8 PVC DRAIN (DRAIN) IMPLANT
GAUZE SPONGE 4X4 12PLY STRL (GAUZE/BANDAGES/DRESSINGS) ×3 IMPLANT
GLOVE BIOGEL PI IND STRL 9 (GLOVE) ×1 IMPLANT
GLOVE BIOGEL PI INDICATOR 9 (GLOVE) ×2
GLOVE SURG ORTHO 9.0 STRL STRW (GLOVE) ×3 IMPLANT
GOWN STRL REUS W/ TWL XL LVL3 (GOWN DISPOSABLE) ×3 IMPLANT
GOWN STRL REUS W/TWL XL LVL3 (GOWN DISPOSABLE) ×9
KIT BASIN OR (CUSTOM PROCEDURE TRAY) ×3 IMPLANT
KIT ROOM TURNOVER OR (KITS) ×3 IMPLANT
MANIFOLD NEPTUNE II (INSTRUMENTS) ×3 IMPLANT
NS IRRIG 1000ML POUR BTL (IV SOLUTION) ×3 IMPLANT
PACK GENERAL/GYN (CUSTOM PROCEDURE TRAY) IMPLANT
PACK ORTHO EXTREMITY (CUSTOM PROCEDURE TRAY) ×3 IMPLANT
PAD ARMBOARD 7.5X6 YLW CONV (MISCELLANEOUS) ×6 IMPLANT
PAD CAST 4YDX4 CTTN HI CHSV (CAST SUPPLIES) ×1 IMPLANT
PADDING CAST COTTON 4X4 STRL (CAST SUPPLIES) ×3
PADDING CAST COTTON 6X4 STRL (CAST SUPPLIES) ×3 IMPLANT
SPONGE LAP 18X18 X RAY DECT (DISPOSABLE) IMPLANT
STAPLER VISISTAT 35W (STAPLE) IMPLANT
STOCKINETTE IMPERVIOUS 9X36 MD (GAUZE/BANDAGES/DRESSINGS) IMPLANT
STOCKINETTE IMPERVIOUS LG (DRAPES) IMPLANT
SUT ETHILON 2 0 PSLX (SUTURE) ×9 IMPLANT
SUT PDS AB 1 CT  36 (SUTURE)
SUT PDS AB 1 CT 36 (SUTURE) IMPLANT
SUT SILK 2 0 (SUTURE) ×3
SUT SILK 2-0 18XBRD TIE 12 (SUTURE) ×1 IMPLANT
SUT VIC AB 0 CT1 27 (SUTURE)
SUT VIC AB 0 CT1 27XBRD ANBCTR (SUTURE) IMPLANT
SUT VIC AB 1 CTX 36 (SUTURE)
SUT VIC AB 1 CTX36XBRD ANBCTR (SUTURE) IMPLANT
SUT VIC AB 2-0 CT1 27 (SUTURE)
SUT VIC AB 2-0 CT1 TAPERPNT 27 (SUTURE) IMPLANT
TOWEL OR 17X24 6PK STRL BLUE (TOWEL DISPOSABLE) ×3 IMPLANT
TOWEL OR 17X26 10 PK STRL BLUE (TOWEL DISPOSABLE) ×3 IMPLANT
TUBE ANAEROBIC SPECIMEN COL (MISCELLANEOUS) IMPLANT
TUBE CONNECTING 12'X1/4 (SUCTIONS) ×1
TUBE CONNECTING 12X1/4 (SUCTIONS) ×2 IMPLANT
UNDERPAD 30X30 INCONTINENT (UNDERPADS AND DIAPERS) ×3 IMPLANT
WATER STERILE IRR 1000ML POUR (IV SOLUTION) ×3 IMPLANT
YANKAUER SUCT BULB TIP NO VENT (SUCTIONS) ×3 IMPLANT

## 2015-06-20 NOTE — Transfer of Care (Signed)
Immediate Anesthesia Transfer of Care Note  Patient: [name]  Procedure(s) Performed: Procedure(s): Revision Left Above Knee Amputation (Left) Removal Deep Hardware Left Femur (Left)  Patient Location: PACU  Anesthesia Type:General  Level of Consciousness: awake and alert   Airway & Oxygen Therapy: Patient Spontanous Breathing and Patient connected to nasal cannula oxygen  Post-op Assessment: Report given to RN  Post vital signs: Reviewed and stable  Last Vitals:  Filed Vitals:   06/20/15 0651 06/20/15 0753  BP: 140/110 99/43  Pulse: 72 72  Temp: 36.1 C   Resp: 16     Complications: No apparent anesthesia complications

## 2015-06-20 NOTE — Op Note (Signed)
06/20/2015  9:36 AM  PATIENT:  [name]    PRE-OPERATIVE DIAGNOSIS:  Gangrene Left Above Knee Amputation  POST-OPERATIVE DIAGNOSIS:  Same  PROCEDURE:  Revision Left Above Knee Amputation, Removal Deep Hardware Left Femur  SURGEON:  Nadara MustardUDA,Yandel Zeiner V, MD  PHYSICIAN ASSISTANT:None ANESTHESIA:   General  PREOPERATIVE INDICATIONS:  [name] is a  82 y.o. female with a diagnosis of Gangrene Left Above Knee Amputation who failed conservative measures and elected for surgical management.    The risks benefits and alternatives were discussed with the patient preoperatively including but not limited to the risks of infection, bleeding, nerve injury, cardiopulmonary complications, the need for revision surgery, among others, and the patient was willing to proceed.  OPERATIVE IMPLANTS: None  OPERATIVE FINDINGS: Ischemic skin soft tissue and muscle from the distal 5 cm of the residual limb  OPERATIVE PROCEDURE: Patient was brought to operating room and underwent a general anesthetic. After adequate levels anesthesia obtained patient's left lower extremity was prepped using DuraPrep draped into a sterile field a timeout was called. A elliptical incision was made around the ischemic tissue distally this removed approximately the distal 5 cm of the soft tissue envelope. A lateral incision was made along the plate and the plate and screws and cables were removed without complications. Electrocautery was used for hemostasis and approximated distal 5 cm of the femur was resected. The wound was irrigated with normal saline. The incision was closed using 2-0 nylon. A Aquasol dressing was applied patient was extubated taken to the PACU in stable condition plan for observation with return to skilled nursing.

## 2015-06-20 NOTE — Anesthesia Procedure Notes (Signed)
Procedure Name: Intubation Date/Time: 06/20/2015 8:41 AM Performed by: De NurseENNIE, Ethie Curless E Pre-anesthesia Checklist: Patient identified, Emergency Drugs available, Suction available, Patient being monitored and Timeout performed Patient Re-evaluated:Patient Re-evaluated prior to inductionOxygen Delivery Method: Circle system utilized Preoxygenation: Pre-oxygenation with 100% oxygen Intubation Type: IV induction Ventilation: Mask ventilation without difficulty Laryngoscope Size: Mac and 3 Grade View: Grade I Tube type: Oral Tube size: 7.0 mm Number of attempts: 1 Airway Equipment and Method: Stylet Placement Confirmation: ETT inserted through vocal cords under direct vision,  positive ETCO2 and breath sounds checked- equal and bilateral Secured at: 20 cm Tube secured with: Tape Dental Injury: Teeth and Oropharynx as per pre-operative assessment

## 2015-06-20 NOTE — Progress Notes (Signed)
[name] called and nurse there stated pt got all of her medications yesterday per [name], RN

## 2015-06-20 NOTE — Anesthesia Preprocedure Evaluation (Signed)
Anesthesia Evaluation  Patient identified by MRN, date of birth, ID band Patient awake    Reviewed: Allergy & Precautions, NPO status , Patient's Chart, lab work & pertinent test results  Airway Mallampati: II  TM Distance: >3 FB Neck ROM: Full    Dental  (+) Edentulous Upper, Edentulous Lower   Pulmonary former smoker,    breath sounds clear to auscultation + decreased breath sounds      Cardiovascular hypertension,  Rhythm:Regular Rate:Normal     Neuro/Psych    GI/Hepatic   Endo/Other    Renal/GU      Musculoskeletal   Abdominal   Peds  Hematology   Anesthesia Other Findings   Reproductive/Obstetrics                             Anesthesia Physical Anesthesia Plan  ASA: III  Anesthesia Plan: General   Post-op Pain Management:    Induction: Intravenous  Airway Management Planned: Oral ETT  Additional Equipment:   Intra-op Plan:   Post-operative Plan: Extubation in OR  Informed Consent: I have reviewed the patients History and Physical, chart, labs and discussed the procedure including the risks, benefits and alternatives for the proposed anesthesia with the patient or authorized representative who has indicated his/her understanding and acceptance.     Plan Discussed with: CRNA and Anesthesiologist  Anesthesia Plan Comments:         Anesthesia Quick Evaluation

## 2015-06-20 NOTE — H&P (Signed)
[name] is an 82 y.o. female.   Chief Complaint: Gangrene dehiscence left above knee amputation HPI: Patient is a 82 year old woman who is status post above-the-knee amputation the left due to necrotic open ankle fracture dislocation on the left. Patient has had progressive gangrenous dehiscence with ischemic changes of the distal 5 cm of the soft tissue envelope she now has exposed hardware. Patient presents at this time for removal of deep retained hardware and revision of the amputation more proximally.  Past Medical History  Diagnosis Date  . Abdominal aneurysm without mention of rupture   . Cholelithiasis   . Hyperlipidemia   . TIA (transient ischemic attack) ~ 09/2010    "[name]"  . Neuropathic pain   . Osteoporosis   . Arthritis   . Hypertension 04/30/11    "not a problem now"  . Pneumonia     "once or twice"  . Migraines   . Headache(784.0)   . Peripheral neuropathy (HCC)     "hands, feet, legs"  . Stroke (HCC)     hx of TIA  . Chronic kidney disease     NEPHRECTOMY  . Achalasia -suspected by hx/ba swallow 01/11/2014  . Gastritis   . Ischemic colitis (HCC)   . Anemia   . GI bleed   . COPD (chronic obstructive pulmonary disease) (HCC)   . Dysphasia   . Pancreatic disease   . Glaucoma   . Cataracts, bilateral   . Hypercholesterolemia   . Anxiety   . Depression     depressive disorder  . Bruises easily     ecchymotic areas T/o  . Coronary artery disease   . Idiopathic pulmonary fibrosis (HCC)   . Hearing impaired     Past Surgical History  Procedure Laterality Date  . Carotid endarterectomy  03/1999; 04/1999    bilateral (Dr. Edilia Bo)  . Nephrectomy  1954    "double left kidney out"  . Tonsillectomy      "when I was little"  . Abdominal hysterectomy  1984  . Tubal ligation  1975  . Dilation and curettage of uterus      "I've had a bunch of those"  . Colon surgery  2002    "8 inches of my colon taken out"  . Fracture  surgery  02/2008    "femur; put rod & screws in"  . Fracture surgery  03/2008    "replaced screws in femur"  . Fracture surgery  04/2008    "replaced hardware S/P rebreak femur"  . Esophagogastroduodenoscopy N/A 01/11/2014    Procedure: ESOPHAGOGASTRODUODENOSCOPY (EGD);  Surgeon: Iva Boop, MD;  Location: [facility];  Service: Endoscopy;  Laterality: N/A;  . Botox injection N/A 01/11/2014    Procedure: BOTOX INJECTION;  Surgeon: Iva Boop, MD;  Location: [facility];  Service: Endoscopy;  Laterality: N/A;  . Carotid angiogram N/A 06/30/2011    Procedure: CAROTID ANGIOGRAM;  Surgeon: Chuck Hint, MD;  Location: [facility] CATH LAB;  Service: Cardiovascular;  Laterality: N/A;  . Colonoscopy N/A 10/25/2014    Procedure: COLONOSCOPY;  Surgeon: Iva Boop, MD;  Location: [facility] END[facility]COPY;  Service: Endoscopy;  Laterality: N/A;  . Amputation Left 06/06/2015    Procedure: Left Above Knee Amputation;  Surgeon: Nadara Mustard, MD;  Location: [facility] OR;  Service: Orthopedics;  Laterality: Left;    Family History  Problem Relation Age of Onset  . Diabetes Mother   . Heart disease Mother   .  Hypertension Mother   . Hyperlipidemia Mother   . Aneurysm Father   . Diabetes Brother    Social History:  reports that she has quit smoking. Her smoking use included Cigarettes. She started smoking about 4 years ago. She has a 15 pack-year smoking history. She has never used smokeless tobacco. She reports that she does not drink alcohol or use illicit drugs.  Allergies:  Allergies  Allergen Reactions  . Chocolate Other (See Comments)  . Aminoglycosides Other (See Comments)    unknown  . Codeine Rash  . Erythromycin Other (See Comments)    unknown  . Macrolides And Ketolides Other (See Comments)    unknown  . Penicillins Rash  . Vancomycin Other (See Comments)    unknown    Medications Prior to Admission  Medication Sig Dispense Refill  . amLODipine (NORVASC) 2.5 MG tablet Take 2.5 mg by  mouth daily.     Marland Kitchen. aspirin EC 325 MG tablet Take 325 mg by mouth every morning.    . bisacodyl (DULCOLAX) 5 MG EC tablet Take 5 mg by mouth every evening.    . Cranberry 475 MG CAPS Take 1 capsule by mouth 2 (two) times daily.    Marland Kitchen. CREON 24000 UNITS CPEP Take 2 capsules by mouth 3 (three) times daily before meals.     Marland Kitchen. escitalopram (LEXAPRO) 10 MG tablet Take 10 mg by mouth daily.    . ferrous sulfate 325 (65 FE) MG tablet Take 325 mg by mouth 2 (two) times daily with a meal.    . fish oil-omega-3 fatty acids 1000 MG capsule Take 2 g by mouth every morning.    Marland Kitchen. HYDROcodone-acetaminophen (NORCO) 5-325 MG tablet Take 1 tablet by mouth every 6 (six) hours as needed. 30 tablet 0  . HYDROcodone-acetaminophen (NORCO/VICODIN) 5-325 MG per tablet Take 1 tablet by mouth every 6 (six) hours as needed for moderate pain. 30 tablet 0  . hydrOXYzine (ATARAX/VISTARIL) 25 MG tablet Take 25 mg by mouth 3 (three) times daily as needed for itching.    Marland Kitchen. ipratropium-albuterol (DUONEB) 0.5-2.5 (3) MG/3ML SOLN Take 3 mLs by nebulization every 6 (six) hours as needed (for shortness of breath).     . Multiple Vitamin (MULTIVITAMIN) tablet Take 1 tablet by mouth daily.    . multivitamin-lutein (OCUVITE-LUTEIN) CAPS capsule Take 1 capsule by mouth daily.    Marland Kitchen. omeprazole (PRILOSEC) 20 MG capsule Take 20 mg by mouth daily.    . polyethylene glycol (MIRALAX / GLYCOLAX) packet Take 17 g by mouth every other day.     . pregabalin (LYRICA) 75 MG capsule Take 75 mg by mouth 3 (three) times daily.     . promethazine (PHENERGAN) 25 MG suppository Place 25 mg rectally every 8 (eight) hours as needed for nausea or vomiting.    Marland Kitchen. Propylene Glycol (SYSTANE BALANCE) 0.6 % SOLN Apply 2 drops to eye 3 (three) times daily.     . rosuvastatin (CRESTOR) 5 MG tablet Take 5 mg by mouth at bedtime.    . senna (SENOKOT) 8.6 MG tablet Take 1 tablet by mouth daily.    . sodium chloride (OCEAN) 0.65 % SOLN nasal spray Place 1 spray into both  nostrils 4 (four) times daily.    . traMADol (ULTRAM) 50 MG tablet Take 1 tablet by mouth every 6 (six) hours as needed for moderate pain.     . Vitamin D, Ergocalciferol, (DRISDOL) 50000 UNITS CAPS capsule Take 50,000 Units by mouth every 30 (thirty) days.  No results found for this or any previous visit (from the past 48 hour(s)). No results found.  Review of Systems  All other systems reviewed and are negative.   There were no vitals taken for this visit. Physical Exam  On examination the distal 5 cm of the soft tissue envelope of the above-knee amputation is ischemic. There is now exposed hardware. Assessment/Plan Assessment: Dehiscence gangrene left above-knee dictation with exposed hardware.  Plan: We'll plan for revision of the amputation removal the deep retained hardware. Risks and benefits of surgery were discussed with the patient's family they state they understand and wish to proceed at this time.  DUDA,MARCUS V 06/20/2015, 6:21 AM

## 2015-06-20 NOTE — Progress Notes (Signed)
Received call patient post AKA revision this AM she has been experiencing low BP normally she is 100-105 systolic, now she Is at 89/50, just given more Dilaudid following low BP reading. IV rate 10cc/hr Cr. 1.16 Weight 74 Kg Will d/c dilaudid 2mg  = 8 mg of morphine Give oral oxycontin low dose for baseline 10mg  q 12hours Change hydrocodone to 5 mg every 4 hours for break through pain. Give 250cc bolus of LR. Discontinue norvasc as she has a low BP. Obtain STAT troponin and Hgb. IV Morphine 1 mg every 2 hours if hydrocodone and oxycodone Are not relieving her pain.

## 2015-06-20 NOTE — Anesthesia Postprocedure Evaluation (Signed)
Anesthesia Post Note  Patient: [name] BilisCarolyn A Heinzman  Procedure(s) Performed: Procedure(s) (LRB): Revision Left Above Knee Amputation (Left) Removal Deep Hardware Left Femur (Left)  Patient location during evaluation: PACU Anesthesia Type: General Level of consciousness: awake and awake and alert Pain management: pain level controlled Respiratory status: spontaneous breathing and nonlabored ventilation Anesthetic complications: no    Last Vitals:  Filed Vitals:   06/20/15 1045 06/20/15 1100  BP: 96/53 104/47  Pulse: 90 79  Temp: 36.2 C 36.4 C  Resp: 15 17    Last Pain: There were no vitals filed for this visit.               Amairany Schumpert COKER

## 2015-06-21 ENCOUNTER — Encounter (HOSPITAL_COMMUNITY): Payer: Self-pay | Admitting: Orthopedic Surgery

## 2015-06-21 DIAGNOSIS — T8781 Dehiscence of amputation stump: Secondary | ICD-10-CM | POA: Diagnosis not present

## 2015-06-21 LAB — TROPONIN I: Troponin I: <0.03 ng/mL (ref <0.031)

## 2015-06-21 MED ORDER — ASPIRIN 81 MG PO CHEW
324.0000 mg | CHEWABLE_TABLET | Freq: Every day | ORAL | Status: DC
  Administered 2015-06-21: 324 mg via ORAL
  Filled 2015-06-21 (×2): qty 4

## 2015-06-21 NOTE — Progress Notes (Signed)
Report given to [facility] 

## 2015-06-21 NOTE — Clinical Social Work Note (Signed)
CSW spoke to case manager who informed CSW that patient's wheelchair was left at the hospital.  CSW contacted [name] to inform them, [name] said to contact patient's family and have them pick up the wheelchair.  CSW attempted to contact patient's son Thayer OhmChris, however his voice mail was full, CSW then contacted patient's daughter Lynden AngCathy who said she will contact patient's son and inform him that the wheelchair is still at hospital.  CSW was informed that wheelchair is in the soiled utility room for patient's son to pick up once he arrives.  Ervin KnackEric R. Ethyl Vila, MSW, Theresia MajorsLCSWA [phone] 06/21/2015 4:16 PM

## 2015-06-21 NOTE — Discharge Summary (Signed)
Physician Discharge Summary  Patient ID: [name] BilisCarolyn A Naval MRN: [id number] DOB/AGE: 80/10/1933 80 y.o.  Admit date: 06/20/2015 Discharge date: 06/21/2015  Admission Diagnoses: Gangrene left above-the-knee amputation  Discharge Diagnoses: Same Active Problems:   Above knee amputation of left lower extremity [facility](HCC)   Discharged Condition: stable  Hospital Course: Patient's hospital course was essentially unremarkable. She underwent revision of the above knee amputation with resection approximate 5 cm of the distal femur removal of deep retained hardware and revision of the left knee amputation postoperatively patient was stable and was discharged back to skilled nursing.  Consults: None  Significant Diagnostic Studies: labs: Routine labs  Treatments: surgery: See operative note  Discharge Exam: Blood pressure 97/45, pulse 77, temperature 98.7 F (37.1 C), temperature source Axillary, resp. rate 18, weight 73.029 kg (161 lb), SpO2 91 %. Incision/Wound: dressing clean and dry  Disposition: 03-Skilled Nursing Facility  Discharge Instructions    Call MD / Call 911    Complete by:  As directed   If you experience chest pain or shortness of breath, CALL 911 and be transported to the hospital emergency room.  If you develope a fever above 101 F, pus (white drainage) or increased drainage or redness at the wound, or calf pain, call your surgeon's office.     Constipation Prevention    Complete by:  As directed   Drink plenty of fluids.  Prune juice may be helpful.  You may use a stool softener, such as Colace (over the counter) 100 mg twice a day.  Use MiraLax (over the counter) for constipation as needed.     Diet - low sodium heart healthy    Complete by:  As directed      Increase activity slowly as tolerated    Complete by:  As directed             Medication List    TAKE these medications        amLODipine 2.5 MG tablet  Commonly known as:  NORVASC  Take 2.5 mg by mouth daily.     aspirin EC 325 MG tablet  Take 325 mg by mouth every morning.     bisacodyl 5 MG EC tablet  Commonly known as:  DULCOLAX  Take 5 mg by mouth every evening.     Cranberry 475 MG Caps  Take 1 capsule by mouth 2 (two) times daily.     CREON 24000 units Cpep  Generic drug:  Pancrelipase (Lip-Prot-Amyl)  Take 2 capsules by mouth 3 (three) times daily before meals.     escitalopram 10 MG tablet  Commonly known as:  LEXAPRO  Take 10 mg by mouth daily.     ferrous sulfate 325 (65 FE) MG tablet  Take 325 mg by mouth 2 (two) times daily with a meal.     fish oil-omega-3 fatty acids 1000 MG capsule  Take 2 g by mouth every morning.     HYDROcodone-acetaminophen 5-325 MG tablet  Commonly known as:  NORCO/VICODIN  Take 1 tablet by mouth every 6 (six) hours as needed for moderate pain.     HYDROcodone-acetaminophen 5-325 MG tablet  Commonly known as:  NORCO  Take 1 tablet by mouth every 6 (six) hours as needed.     hydrOXYzine 25 MG tablet  Commonly known as:  ATARAX/VISTARIL  Take 25 mg by mouth 3 (three) times daily as needed for itching.     ipratropium-albuterol 0.5-2.5 (3) MG/3ML Soln  Commonly known as:  DUONEB  Take 3 mLs by nebulization every 6 (six) hours as needed (for shortness of breath).     multivitamin tablet  Take 1 tablet by mouth daily.     multivitamin-lutein Caps capsule  Take 1 capsule by mouth daily.     omeprazole 20 MG capsule  Commonly known as:  PRILOSEC  Take 20 mg by mouth daily.     polyethylene glycol packet  Commonly known as:  MIRALAX / GLYCOLAX  Take 17 g by mouth every other day.     pregabalin 75 MG capsule  Commonly known as:  LYRICA  Take 75 mg by mouth 3 (three) times daily.     promethazine 25 MG suppository  Commonly known as:  PHENERGAN  Place 25 mg rectally every 8 (eight) hours as needed for nausea or vomiting.     rosuvastatin 5 MG tablet  Commonly known as:  CRESTOR  Take 5 mg by mouth at bedtime.     senna 8.6 MG  tablet  Commonly known as:  SENOKOT  Take 1 tablet by mouth daily.     sodium chloride 0.65 % Soln nasal spray  Commonly known as:  OCEAN  Place 1 spray into both nostrils 4 (four) times daily.     SYSTANE BALANCE 0.6 % Soln  Generic drug:  Propylene Glycol  Apply 2 drops to eye 3 (three) times daily.     traMADol 50 MG tablet  Commonly known as:  ULTRAM  Take 1 tablet by mouth every 6 (six) hours as needed for moderate pain.     Vitamin D (Ergocalciferol) 50000 units Caps capsule  Commonly known as:  DRISDOL  Take 50,000 Units by mouth every 30 (thirty) days.           Follow-up Information    Follow up with Treon Kehl V, MD In 3 weeks.   Specialty:  Orthopedic Surgery   Contact information:   [address] [place] [address] [phone]       Signed: Nadara Mustard 06/21/2015, 6:57 AM

## 2015-06-21 NOTE — Progress Notes (Signed)
Patient ID: [name], female   DOB: 05/04/1934, 82 y.o.   MRN: [id number] Patient is alert and more oriented today than I have seen in the past. We'll plan for discharge back to skilled nursing. Postoperative day 1 revision left above knee amputation and removal of deep retained hardware.

## 2015-06-21 NOTE — Clinical Social Work Note (Signed)
Patient to be d/c'ed today to [name].  Patient and family agreeable to plans will transport via ems RN to call report to [phone] room 504.  CSW contacted patient's daughter Zonia KiefCathy Maddox.   Windell MouldingEric Jovonni Borquez, MSW, Theresia MajorsLCSWA [phone]

## 2015-07-20 ENCOUNTER — Encounter (HOSPITAL_COMMUNITY): Payer: Self-pay | Admitting: Emergency Medicine

## 2015-07-20 ENCOUNTER — Emergency Department (HOSPITAL_COMMUNITY): Payer: Medicare Other

## 2015-07-20 ENCOUNTER — Emergency Department (HOSPITAL_COMMUNITY)
Admission: EM | Admit: 2015-07-20 | Discharge: 2015-07-20 | Disposition: A | Discharge status: Home / Self Care | Payer: Medicare Other | Attending: Emergency Medicine | Admitting: Emergency Medicine

## 2015-07-20 DIAGNOSIS — Y998 Other external cause status: Secondary | ICD-10-CM | POA: Diagnosis not present

## 2015-07-20 DIAGNOSIS — I2510 Atherosclerotic heart disease of native coronary artery without angina pectoris: Secondary | ICD-10-CM | POA: Insufficient documentation

## 2015-07-20 DIAGNOSIS — E7800 Pure hypercholesterolemia, unspecified: Secondary | ICD-10-CM | POA: Diagnosis not present

## 2015-07-20 DIAGNOSIS — Y9289 Other specified places as the place of occurrence of the external cause: Secondary | ICD-10-CM | POA: Diagnosis not present

## 2015-07-20 DIAGNOSIS — Z880 Allergy status to penicillin: Secondary | ICD-10-CM | POA: Diagnosis not present

## 2015-07-20 DIAGNOSIS — E785 Hyperlipidemia, unspecified: Secondary | ICD-10-CM | POA: Insufficient documentation

## 2015-07-20 DIAGNOSIS — Z87891 Personal history of nicotine dependence: Secondary | ICD-10-CM | POA: Diagnosis not present

## 2015-07-20 DIAGNOSIS — F419 Anxiety disorder, unspecified: Secondary | ICD-10-CM | POA: Insufficient documentation

## 2015-07-20 DIAGNOSIS — W050XXA Fall from non-moving wheelchair, initial encounter: Secondary | ICD-10-CM | POA: Diagnosis not present

## 2015-07-20 DIAGNOSIS — Z8701 Personal history of pneumonia (recurrent): Secondary | ICD-10-CM | POA: Insufficient documentation

## 2015-07-20 DIAGNOSIS — Z8669 Personal history of other diseases of the nervous system and sense organs: Secondary | ICD-10-CM | POA: Diagnosis not present

## 2015-07-20 DIAGNOSIS — S0101XA Laceration without foreign body of scalp, initial encounter: Secondary | ICD-10-CM | POA: Insufficient documentation

## 2015-07-20 DIAGNOSIS — Z79899 Other long term (current) drug therapy: Secondary | ICD-10-CM | POA: Diagnosis not present

## 2015-07-20 DIAGNOSIS — J449 Chronic obstructive pulmonary disease, unspecified: Secondary | ICD-10-CM | POA: Insufficient documentation

## 2015-07-20 DIAGNOSIS — G43909 Migraine, unspecified, not intractable, without status migrainosus: Secondary | ICD-10-CM | POA: Diagnosis not present

## 2015-07-20 DIAGNOSIS — D649 Anemia, unspecified: Secondary | ICD-10-CM | POA: Diagnosis not present

## 2015-07-20 DIAGNOSIS — Z792 Long term (current) use of antibiotics: Secondary | ICD-10-CM | POA: Insufficient documentation

## 2015-07-20 DIAGNOSIS — F329 Major depressive disorder, single episode, unspecified: Secondary | ICD-10-CM | POA: Diagnosis not present

## 2015-07-20 DIAGNOSIS — Z23 Encounter for immunization: Secondary | ICD-10-CM | POA: Diagnosis not present

## 2015-07-20 DIAGNOSIS — N189 Chronic kidney disease, unspecified: Secondary | ICD-10-CM | POA: Insufficient documentation

## 2015-07-20 DIAGNOSIS — Z7982 Long term (current) use of aspirin: Secondary | ICD-10-CM | POA: Insufficient documentation

## 2015-07-20 DIAGNOSIS — W19XXXA Unspecified fall, initial encounter: Secondary | ICD-10-CM

## 2015-07-20 DIAGNOSIS — Z8673 Personal history of transient ischemic attack (TIA), and cerebral infarction without residual deficits: Secondary | ICD-10-CM | POA: Insufficient documentation

## 2015-07-20 DIAGNOSIS — M1990 Unspecified osteoarthritis, unspecified site: Secondary | ICD-10-CM | POA: Insufficient documentation

## 2015-07-20 DIAGNOSIS — S0191XA Laceration without foreign body of unspecified part of head, initial encounter: Secondary | ICD-10-CM

## 2015-07-20 DIAGNOSIS — M810 Age-related osteoporosis without current pathological fracture: Secondary | ICD-10-CM | POA: Insufficient documentation

## 2015-07-20 DIAGNOSIS — Y9389 Activity, other specified: Secondary | ICD-10-CM | POA: Insufficient documentation

## 2015-07-20 DIAGNOSIS — I129 Hypertensive chronic kidney disease with stage 1 through stage 4 chronic kidney disease, or unspecified chronic kidney disease: Secondary | ICD-10-CM | POA: Insufficient documentation

## 2015-07-20 DIAGNOSIS — Z8719 Personal history of other diseases of the digestive system: Secondary | ICD-10-CM | POA: Diagnosis not present

## 2015-07-20 MED ORDER — TETANUS-DIPHTH-ACELL PERTUSSIS 5-2.5-18.5 LF-MCG/0.5 IM SUSP
0.5000 mL | Freq: Once | INTRAMUSCULAR | Status: AC
  Administered 2015-07-20: 0.5 mL via INTRAMUSCULAR
  Filled 2015-07-20: qty 0.5

## 2015-07-20 MED ORDER — LIDOCAINE-EPINEPHRINE (PF) 2 %-1:200000 IJ SOLN
20.0000 mL | Freq: Once | INTRAMUSCULAR | Status: AC
  Administered 2015-07-20: 20 mL via INTRADERMAL
  Filled 2015-07-20: qty 20

## 2015-07-20 NOTE — ED Notes (Signed)
[facility] called for transport back to [facility]

## 2015-07-20 NOTE — ED Provider Notes (Signed)
CSN: [id number]     Arrival date & time 07/20/15  1233 History   First MD Initiated Contact with Patient 07/20/15 1236     Chief Complaint  Patient presents with  . Fall     (Consider location/radiation/quality/duration/timing/severity/associated sxs/prior Treatment) HPI 82 year old female who presents after unwitnessed fall. History of hypertension, hyperlipidemia, TIA, abdominal aortic aneurysm, CKD, and recent left AKA. Currently residing at [facility] where she was found on the ground next to her wheelchair. She had a significant laceration to her forehead and was sent to the ED for evaluation. According to facility, patient alert and oriented 1, and currently at her baseline mental status. Patient was unable to describe any recent history, and denies having any complaints or any pain.   Past Medical History  Diagnosis Date  . Abdominal aneurysm without mention of rupture   . Cholelithiasis   . Hyperlipidemia   . TIA (transient ischemic attack) ~ 09/2010    "[name]"  . Neuropathic pain   . Osteoporosis   . Arthritis   . Hypertension 04/30/11    "not a problem now"  . Pneumonia     "once or twice"  . Migraines   . Headache(784.0)   . Peripheral neuropathy (HCC)     "hands, feet, legs"  . Stroke (HCC)     hx of TIA  . Chronic kidney disease     NEPHRECTOMY  . Achalasia -suspected by hx/ba swallow 01/11/2014  . Gastritis   . Ischemic colitis (HCC)   . Anemia   . GI bleed   . COPD (chronic obstructive pulmonary disease) (HCC)   . Dysphasia   . Pancreatic disease   . Glaucoma   . Cataracts, bilateral   . Hypercholesterolemia   . Anxiety   . Depression     depressive disorder  . Bruises easily     ecchymotic areas T/o  . Coronary artery disease   . Idiopathic pulmonary fibrosis (HCC)   . Hearing impaired    Past Surgical History  Procedure Laterality Date  . Carotid endarterectomy  03/1999; 04/1999    bilateral (Dr. Edilia Bo)  .  Nephrectomy  1954    "double left kidney out"  . Tonsillectomy      "when I was little"  . Abdominal hysterectomy  1984  . Tubal ligation  1975  . Dilation and curettage of uterus      "I've had a bunch of those"  . Colon surgery  2002    "8 inches of my colon taken out"  . Fracture surgery  02/2008    "femur; put rod & screws in"  . Fracture surgery  03/2008    "replaced screws in femur"  . Fracture surgery  04/2008    "replaced hardware S/P rebreak femur"  . Esophagogastroduodenoscopy N/A 01/11/2014    Procedure: ESOPHAGOGASTRODUODENOSCOPY (EGD);  Surgeon: Iva Boop, MD;  Location: [facility];  Service: Endoscopy;  Laterality: N/A;  . Botox injection N/A 01/11/2014    Procedure: BOTOX INJECTION;  Surgeon: Iva Boop, MD;  Location: [facility] ENDOSCOPY;  Service: Endoscopy;  Laterality: N/A;  . Carotid angiogram N/A 06/30/2011    Procedure: CAROTID ANGIOGRAM;  Surgeon: Chuck Hint, MD;  Location: [facility] CATH LAB;  Service: Cardiovascular;  Laterality: N/A;  . Colonoscopy N/A 10/25/2014    Procedure: COLONOSCOPY;  Surgeon: Iva Boop, MD;  Location: [facility] ENDOSCOPY;  Service: Endoscopy;  Laterality: N/A;  . Amputation Left 06/06/2015  Procedure: Left Above Knee Amputation;  Surgeon: Nadara Mustard, MD;  Location: [facility] OR;  Service: Orthopedics;  Laterality: Left;  . Stump revision Left 06/20/2015    Procedure: Revision Left Above Knee Amputation;  Surgeon: Nadara Mustard, MD;  Location: [facility] OR;  Service: Orthopedics;  Laterality: Left;  . Hardware removal Left 06/20/2015    Procedure: Removal Deep Hardware Left Femur;  Surgeon: Nadara Mustard, MD;  Location: [facility] OR;  Service: Orthopedics;  Laterality: Left;   Family History  Problem Relation Age of Onset  . Diabetes Mother   . Heart disease Mother   . Hypertension Mother   . Hyperlipidemia Mother   . Aneurysm Father   . Diabetes Brother    Social History  Substance Use Topics  . Smoking status: Former Smoker -- 0.25 packs/day  for 60 years    Types: Cigarettes    Start date: 06/17/2011  . Smokeless tobacco: Never Used  . Alcohol Use: No   OB History    No data available     Review of Systems 10/14 systems reviewed and are negative other than those stated in the HPI    Allergies  Chocolate; Aminoglycosides; Codeine; Erythromycin; Macrolides and ketolides; Penicillins; and Vancomycin  Home Medications   Prior to Admission medications   Medication Sig Start Date End Date Taking? Authorizing Provider  amLODipine (NORVASC) 2.5 MG tablet Take 2.5 mg by mouth daily.  07/20/12  Yes Historical Provider, MD  aspirin EC 325 MG tablet Take 325 mg by mouth every morning.   Yes Historical Provider, MD  bisacodyl (DULCOLAX) 5 MG EC tablet Take 5 mg by mouth every evening.   Yes Historical Provider, MD  CREON 24000 UNITS CPEP Take 2 capsules by mouth 3 (three) times daily before meals.  05/26/11  Yes Historical Provider, MD  doxycycline (VIBRA-TABS) 100 MG tablet Take 100 mg by mouth 2 (two) times daily.   Yes Historical Provider, MD  escitalopram (LEXAPRO) 10 MG tablet Take 10 mg by mouth daily.   Yes Historical Provider, MD  ferrous sulfate 325 (65 FE) MG tablet Take 325 mg by mouth 2 (two) times daily with a meal.   Yes Historical Provider, MD  hydrOXYzine (ATARAX/VISTARIL) 25 MG tablet Take 25 mg by mouth 3 (three) times daily as needed for itching.   Yes Historical Provider, MD  ipratropium-albuterol (DUONEB) 0.5-2.5 (3) MG/3ML SOLN Take 3 mLs by nebulization every 6 (six) hours as needed (for shortness of breath).    Yes Historical Provider, MD  Multiple Vitamin (MULTIVITAMIN) tablet Take 1 tablet by mouth daily.   Yes Historical Provider, MD  multivitamin-lutein (OCUVITE-LUTEIN) CAPS capsule Take 1 capsule by mouth daily.   Yes Historical Provider, MD  omeprazole (PRILOSEC) 20 MG capsule Take 20 mg by mouth daily.   Yes Historical Provider, MD  oxymetazoline (AFRIN) 0.05 % nasal spray Place 1 spray into both  nostrils 2 (two) times daily as needed for congestion.   Yes Historical Provider, MD  polyethylene glycol (MIRALAX / GLYCOLAX) packet Take 17 g by mouth daily.    Yes Historical Provider, MD  pregabalin (LYRICA) 75 MG capsule Take 75 mg by mouth 3 (three) times daily.    Yes Historical Provider, MD  promethazine (PHENERGAN) 25 MG suppository Place 25 mg rectally every 8 (eight) hours as needed for nausea or vomiting.   Yes Historical Provider, MD  Propylene Glycol (SYSTANE BALANCE) 0.6 % SOLN Apply 2 drops to eye 3 (three) times daily.  Yes Historical Provider, MD  rosuvastatin (CRESTOR) 5 MG tablet Take 5 mg by mouth at bedtime.   Yes Historical Provider, MD  senna (SENOKOT) 8.6 MG tablet Take 1 tablet by mouth daily.   Yes Historical Provider, MD  sodium chloride (OCEAN) 0.65 % SOLN nasal spray Place 1 spray into both nostrils 4 (four) times daily.   Yes Historical Provider, MD  traMADol (ULTRAM) 50 MG tablet Take 1 tablet by mouth every 6 (six) hours as needed for moderate pain.  06/04/15  Yes Historical Provider, MD  vitamin C (ASCORBIC ACID) 500 MG tablet Take 500 mg by mouth 2 (two) times daily.   Yes Historical Provider, MD  Vitamin D, Ergocalciferol, (DRISDOL) 50000 UNITS CAPS capsule Take 50,000 Units by mouth every 30 (thirty) days.   Yes Historical Provider, MD  zinc sulfate 220 MG capsule Take 220 mg by mouth daily.   Yes Historical Provider, MD  HYDROcodone-acetaminophen (NORCO) 5-325 MG tablet Take 1 tablet by mouth every 6 (six) hours as needed. Patient not taking: Reported on 07/20/2015 06/06/15   Nadara Mustard, MD  HYDROcodone-acetaminophen (NORCO/VICODIN) 5-325 MG per tablet Take 1 tablet by mouth every 6 (six) hours as needed for moderate pain. Patient not taking: Reported on 07/20/2015 09/02/14   Dorothea Ogle, MD   BP 129/80 mmHg  Pulse 83  Temp(Src) 98.1 F (36.7 C) (Oral)  Resp 18  SpO2 95% Physical Exam Physical Exam  Nursing note and vitals reviewed. Constitutional:  Well developed, well nourished, non-toxic, and in no acute distress Head: Normocephalic and atraumatic.  Mouth/Throat: Oropharynx is clear and moist.  Neck: Normal range of motion. Neck supple. No cervical spine tenderness Cardiovascular: Normal rate and regular rhythm.   Pulmonary/Chest: Effort normal and breath sounds normal. No chest wall tenderness. Abdominal: Soft. There is no tenderness. There is no rebound and no guarding.  Musculoskeletal: No deformities. Left AKA, sutures in place with some overlying erythema and dry skin. No significant swelling, induration, drainage or pain. No pelvic tenderness. Neurological: Alert, oriented only to self, no facial droop, moves all extremities symmetrically to command Skin: Skin is warm and dry. 6-8 cm V-shaped laceration over right forehead extending into scalp  Psychiatric: Cooperative  ED Course  Procedures (including critical care time) Labs Review Labs Reviewed - No data to display  Imaging Review Ct Head Wo Contrast  07/20/2015  CLINICAL DATA:  Fall from wheelchair with head and neck injury EXAM: CT HEAD WITHOUT CONTRAST CT CERVICAL SPINE WITHOUT CONTRAST TECHNIQUE: Multidetector CT imaging of the head and cervical spine was performed following the standard protocol without intravenous contrast. Multiplanar CT image reconstructions of the cervical spine were also generated. COMPARISON:  None. FINDINGS: CT HEAD FINDINGS Postsurgical changes are again seen on the left stable from the prior exam. Laceration is noted in the forehead which is deep to the level of the bone although no fracture is seen. Atrophic changes are noted. Changes of prior encephalomalacia are seen in the left frontal lobe. No acute hemorrhage, acute infarction or space-occupying mass lesion is identified. CT CERVICAL SPINE FINDINGS Seven cervical segments are well visualized. Vertebral body height is well maintained. Disc space narrowing is noted at C5-6 with mild osteophytic  changes. Significant dilatation of the proximal esophagus is noted. The lung apices are unremarkable. No surrounding soft tissue abnormality is seen. Carotid calcifications are noted. No acute fracture or dislocation is seen. IMPRESSION: CT of the head: Chronic atrophic changes. Stable encephalomalacia on the left. Forehead scalp injury  consistent with a history. CT of the cervical spine: Degenerative change without acute abnormality. Electronically Signed   By: Alcide Clever M.D.   On: 07/20/2015 14:36   Ct Cervical Spine Wo Contrast  07/20/2015  CLINICAL DATA:  Fall from wheelchair with head and neck injury EXAM: CT HEAD WITHOUT CONTRAST CT CERVICAL SPINE WITHOUT CONTRAST TECHNIQUE: Multidetector CT imaging of the head and cervical spine was performed following the standard protocol without intravenous contrast. Multiplanar CT image reconstructions of the cervical spine were also generated. COMPARISON:  None. FINDINGS: CT HEAD FINDINGS Postsurgical changes are again seen on the left stable from the prior exam. Laceration is noted in the forehead which is deep to the level of the bone although no fracture is seen. Atrophic changes are noted. Changes of prior encephalomalacia are seen in the left frontal lobe. No acute hemorrhage, acute infarction or space-occupying mass lesion is identified. CT CERVICAL SPINE FINDINGS Seven cervical segments are well visualized. Vertebral body height is well maintained. Disc space narrowing is noted at C5-6 with mild osteophytic changes. Significant dilatation of the proximal esophagus is noted. The lung apices are unremarkable. No surrounding soft tissue abnormality is seen. Carotid calcifications are noted. No acute fracture or dislocation is seen. IMPRESSION: CT of the head: Chronic atrophic changes. Stable encephalomalacia on the left. Forehead scalp injury consistent with a history. CT of the cervical spine: Degenerative change without acute abnormality. Electronically Signed    By: Alcide Clever M.D.   On: 07/20/2015 14:36   I have personally reviewed and evaluated these images and lab results as part of my medical decision-making.   EKG Interpretation   Date/Time:  Friday July 20 2015 12:50:10 EST Ventricular Rate:  79 PR Interval:  165 QRS Duration: 146 QT Interval:  425 QTC Calculation: 487 R Axis:   -36 Text Interpretation:  Sinus rhythm Consider right atrial enlargement Left  bundle branch block Artifact in lead(s) I II III aVR aVL No significant  change since last tracing Confirmed by Laymon Stockert MD, Annabelle Harman ([id number]) on 07/20/2015  12:58:14 PM      MDM   Final diagnoses:  Fall, initial encounter  Laceration of head, initial encounter    82 year old female presenting after fall from wheelchair. Is at mental status baseline on my evaluation. Vital signs are not concerning. In a cervical collar on presentation. He has a large V-shaped laceration over the right forehead extending through the hairline into her scalp. Ct head and cervical spine showing no acute traumatic injuries. Cervical collar cleared. Grossly neuro in tact. Scalp laceration repaired (see separate attached note). No other acute injuries noted on exam. Felt appropriate for discharge back to [facility] that she is at her baseline.   Lavera Guise, MD 07/20/15 [phone]

## 2015-07-20 NOTE — ED Notes (Signed)
Per EMS-at [name] for recent left BKA-was in wheelchair this am-fell forward, unwitnessed-laceration to right forehead-skin tear to right hand and knee-oriented X 1 which is baseline per nursing facility-placed in C-Collar-denies pain and moving extremities without difficulty

## 2015-07-20 NOTE — Discharge Instructions (Signed)
Please have sutures removed in 7 days by PCP. Please watch for signs of infection, including fever, pus drainage from wound, increasing redness swelling around the wound, or any other symptoms concerning to you. CT scan of head and neck does not show serious injury.  Head Injury, Adult You have a head injury. Headaches and throwing up (vomiting) are common after a head injury. It should be easy to wake up from sleeping. Sometimes you must stay in the hospital. Most problems happen within the first 24 hours. Side effects may occur up to 7-10 days after the injury.  WHAT ARE THE TYPES OF HEAD INJURIES? Head injuries can be as minor as a bump. Some head injuries can be more severe. More severe head injuries include:  A jarring injury to the brain (concussion).  A bruise of the brain (contusion). This mean there is bleeding in the brain that can cause swelling.  A cracked skull (skull fracture).  Bleeding in the brain that collects, clots, and forms a bump (hematoma). WHEN SHOULD I GET HELP RIGHT AWAY?   You are confused or sleepy.  You cannot be woken up.  You feel sick to your stomach (nauseous) or keep throwing up (vomiting).  Your dizziness or unsteadiness is getting worse.  You have very bad, lasting headaches that are not helped by medicine. Take medicines only as told by your doctor.  You cannot use your arms or legs like normal.  You cannot walk.  You notice changes in the black spots in the center of the colored part of your eye (pupil).  You have clear or bloody fluid coming from your nose or ears.  You have trouble seeing. During the next 24 hours after the injury, you must stay with someone who can watch you. This person should get help right away (call 911 in the [place] if you start to shake and are not able to control it (have seizures), you pass out, or you are unable to wake up. HOW CAN I PREVENT A HEAD INJURY IN THE FUTURE?  Wear seat belts.  Wear a helmet while  bike riding and playing sports like football.  Stay away from dangerous activities around the house. WHEN CAN I RETURN TO NORMAL ACTIVITIES AND ATHLETICS? See your doctor before doing these activities. You should not do normal activities or play contact sports until 1 week after the following symptoms have stopped:  Headache that does not go away.  Dizziness.  Poor attention.  Confusion.  Memory problems.  Sickness to your stomach or throwing up.  Tiredness.  Fussiness.  Bothered by bright lights or loud noises.  Anxiousness or depression.  Restless sleep. MAKE SURE YOU:   Understand these instructions.  Will watch your condition.  Will get help right away if you are not doing well or get worse.   This information is not intended to replace advice given to you by your health care provider. Make sure you discuss any questions you have with your health care provider.   Document Released: 05/15/2008 Document Revised: 06/23/2014 Document Reviewed: 02/07/2013 Elsevier Interactive Patient Education 2016 [employer]  Sutured Wound Care Sutures are stitches that can be used to close wounds. Taking care of your wound properly can help to prevent pain and infection. It can also help your wound to heal more quickly. HOW TO CARE FOR YOUR SUTURED WOUND Wound Care  Keep the wound clean and dry.  If you were given a bandage (dressing), you should change it at least  once per day or as directed by your health care provider. You should also change it if it becomes wet or dirty.  Keep the wound completely dry for the first 24 hours or as directed by your health care provider. After that time, you may shower or bathe. However, make sure that the wound is not soaked in water until the sutures have been removed.  Clean the wound one time each day or as directed by your health care provider.  Wash the wound with soap and water.  Rinse the wound with water to remove all soap.  Pat  the wound dry with a clean towel. Do not rub the wound.  Aftercleaning the wound, apply a thin layer of antibioticointment as directed by your health care provider. This will help to prevent infection and keep the dressing from sticking to the wound.  Have the sutures removed as directed by your health care provider. General Instructions  Take or apply medicines only as directed by your health care provider.  To help prevent scarring, make sure to cover your wound with sunscreen whenever you are outside after the sutures are removed and the wound is healed. Make sure to wear a sunscreen of at least 30 SPF.  If you were prescribed an antibiotic medicine or ointment, finish all of it even if you start to feel better.  Do not scratch or pick at the wound.  Keep all follow-up visits as directed by your health care provider. This is important.  Check your wound every day for signs of infection. Watch for:   Redness, swelling, or pain.  Fluid, blood, or pus.  Raise (elevate) the injured area above the level of your heart while you are sitting or lying down, if possible.  Avoid stretching your wound.  Drink enough fluids to keep your urine clear or pale yellow. SEEK MEDICAL CARE IF:  You received a tetanus shot and you have swelling, severe pain, redness, or bleeding at the injection site.  You have a fever.  A wound that was closed breaks open.  You notice a bad smell coming from the wound.  You notice something coming out of the wound, such as wood or glass.  Your pain is not controlled with medicine.  You have increased redness, swelling, or pain at the site of your wound.  You have fluid, blood, or pus coming from your wound.  You notice a change in the color of your skin near your wound.  You need to change the dressing frequently due to fluid, blood, or pus draining from the wound.  You develop a new rash.  You develop numbness around the wound. SEEK IMMEDIATE  MEDICAL CARE IF:  You develop severe swelling around the injury site.  Your pain suddenly increases and is severe.  You develop painful lumps near the wound or on skin that is anywhere on your body.  You have a red streak going away from your wound.  The wound is on your hand or foot and you cannot properly move a finger or toe.  The wound is on your hand or foot and you notice that your fingers or toes look pale or bluish.   This information is not intended to replace advice given to you by your health care provider. Make sure you discuss any questions you have with your health care provider.   Document Released: 07/10/2004 Document Revised: 10/17/2014 Document Reviewed: 01/12/2013 Elsevier Interactive Patient Education [employer]

## 2015-07-20 NOTE — ED Provider Notes (Signed)
LACERATION REPAIR Performed by: Barrett Henle Authorized by: Barrett Henle Consent: Verbal consent obtained. Risks and benefits: risks, benefits and alternatives were discussed Consent given by: patient Patient identity confirmed: provided demographic data Prepped and Draped in normal sterile fashion Wound explored  Laceration Location: right frontal scalp  Laceration Length: 7cm  No Foreign Bodies seen or palpated  Anesthesia: local infiltration  Local anesthetic: lidocaine 2% with epinephrine  Anesthetic total: 5 ml  Irrigation method: syringe Amount of cleaning: standard  Skin closure: 5-0 prolene  Number of sutures: 12  Technique: simple interrupted  Patient tolerance: Patient tolerated the procedure well with no immediate complications.  Satira Sark [place], [place] 07/20/15 1537  Lavera Guise, MD 07/20/15 [phone]

## 2015-07-26 ENCOUNTER — Ambulatory Visit (HOSPITAL_BASED_OUTPATIENT_CLINIC_OR_DEPARTMENT_OTHER): Payer: Medicare Other

## 2015-07-26 ENCOUNTER — Encounter (HOSPITAL_BASED_OUTPATIENT_CLINIC_OR_DEPARTMENT_OTHER): Payer: Medicare Other | Attending: Internal Medicine

## 2015-07-26 DIAGNOSIS — L89614 Pressure ulcer of right heel, stage 4: Secondary | ICD-10-CM | POA: Diagnosis present

## 2015-07-26 DIAGNOSIS — L89322 Pressure ulcer of left buttock, stage 2: Secondary | ICD-10-CM | POA: Insufficient documentation

## 2015-07-26 DIAGNOSIS — L89153 Pressure ulcer of sacral region, stage 3: Secondary | ICD-10-CM | POA: Diagnosis not present

## 2015-07-26 DIAGNOSIS — Z89619 Acquired absence of unspecified leg above knee: Secondary | ICD-10-CM | POA: Insufficient documentation

## 2015-08-15 DEATH — deceased

## 2015-11-17 IMAGING — RF DG ESOPHAGUS
14 of 24 series · 14 of 24 positions shown · non-contrast
Comparison: Chest CT 11/19/2011.  Abdominal CT 06/14/2012.

CLINICAL DATA: Dysphagia for 1 month. History of arthritis, hip
fracture, neuropathy and TIA.

EXAM:
ESOPHOGRAM/BARIUM SWALLOW
TECHNIQUE: Single contrast examination was performed using  thin barium.
FLUOROSCOPY TIME:  2 min and 7 seconds.

[Series 1: run · 1 of 1 slices shown (1 of 14)]
[im 1/1]
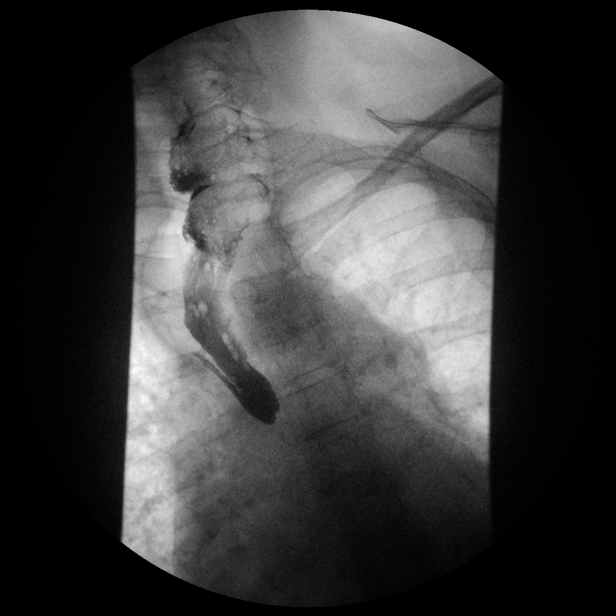

[Series 3: run · 1 of 1 slices shown (2 of 14)]
[im 1/1]
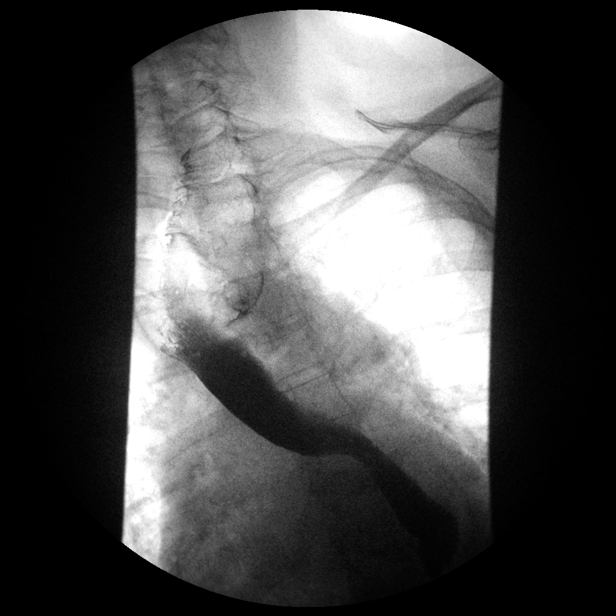

[Series 5: run · 1 of 7 slices shown (3 of 14)]
[im 1/7]
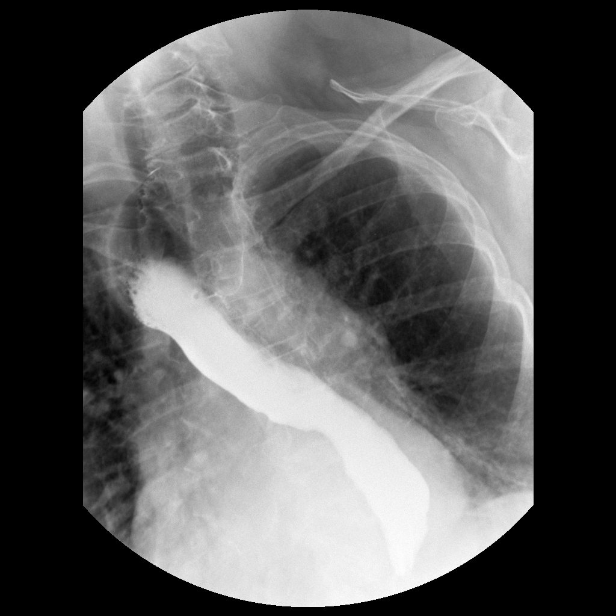

[Series 7: run · 1 of 1 slices shown (4 of 14)]
[im 1/1]
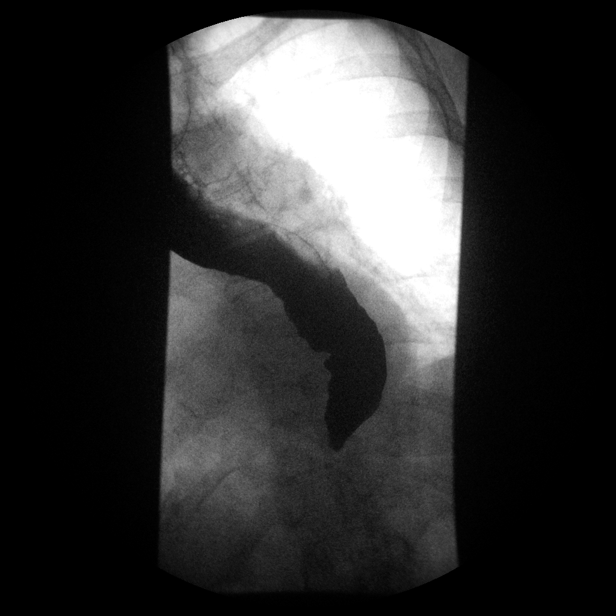

[Series 8: run · 1 of 1 slices shown (5 of 14)]
[im 1/1]
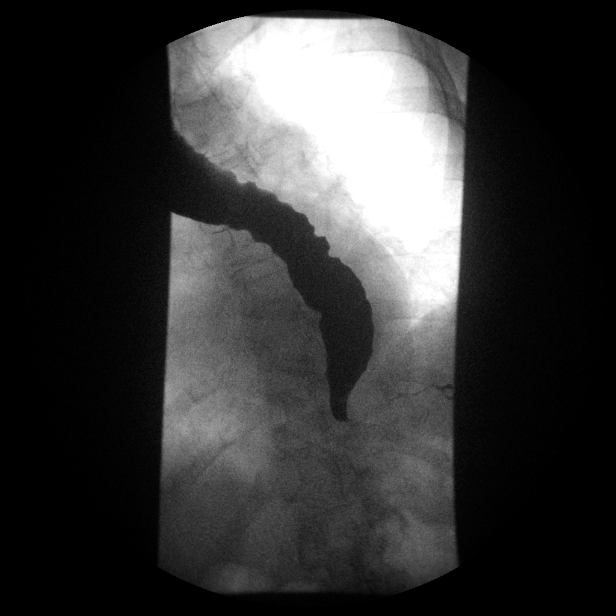

[Series 10: run · 1 of 1 slices shown (6 of 14)]
[im 1/1]
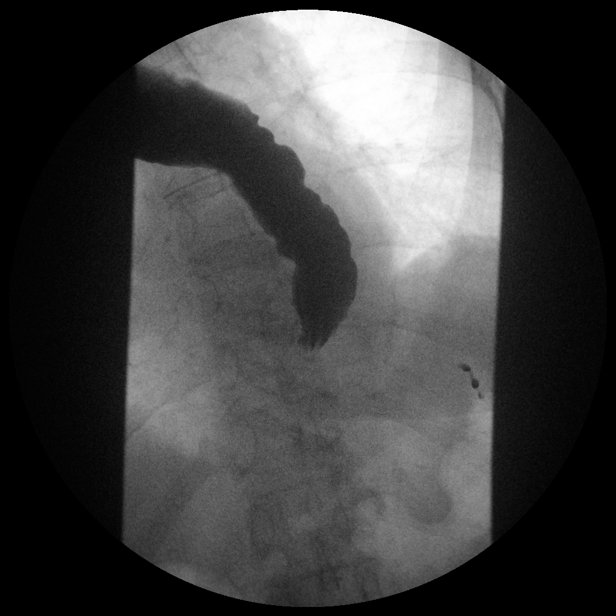

[Series 12: run · 1 of 2 slices shown (7 of 14)]
[im 1/2]
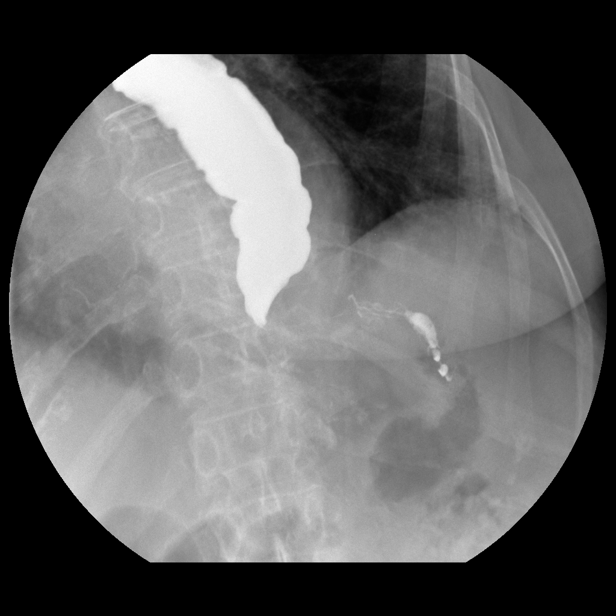

[Series 13: run · 1 of 1 slices shown (8 of 14)]
[im 1/1]
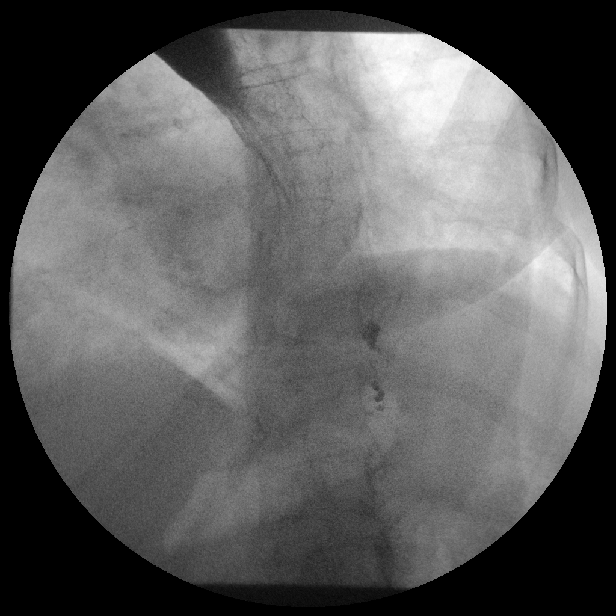

[Series 15: run · 1 of 1 slices shown (9 of 14)]
[im 1/1]
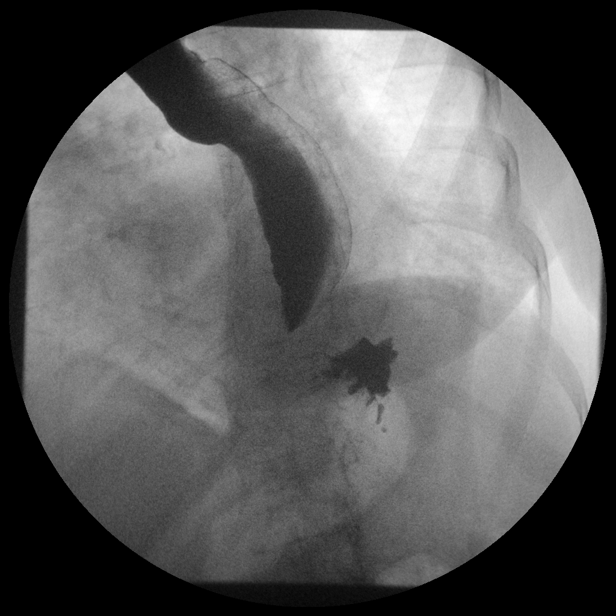

[Series 17: run · 1 of 1 slices shown (10 of 14)]
[im 1/1]
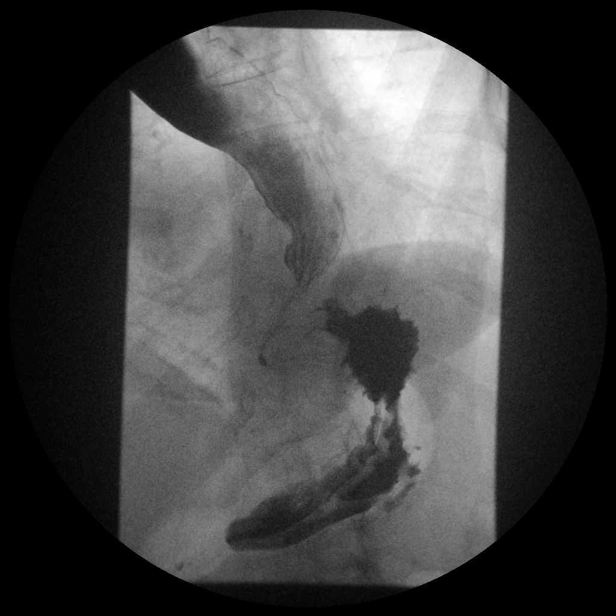

[Series 19: run · 1 of 1 slices shown (11 of 14)]
[im 1/1]
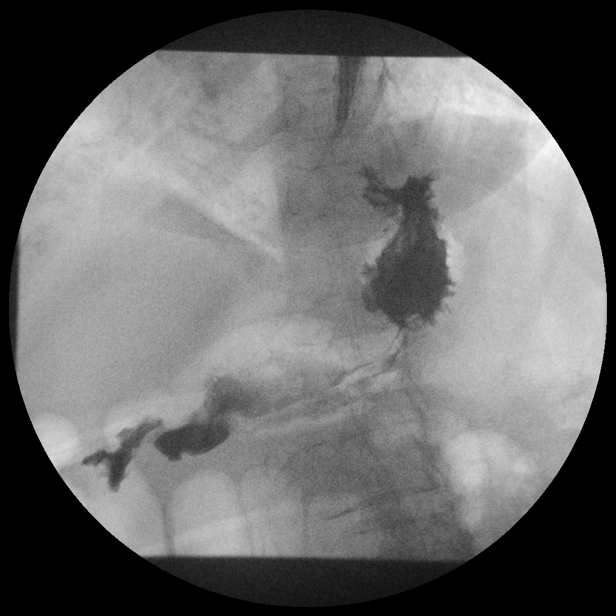

[Series 20: run · 1 of 1 slices shown (12 of 14)]
[im 1/1]
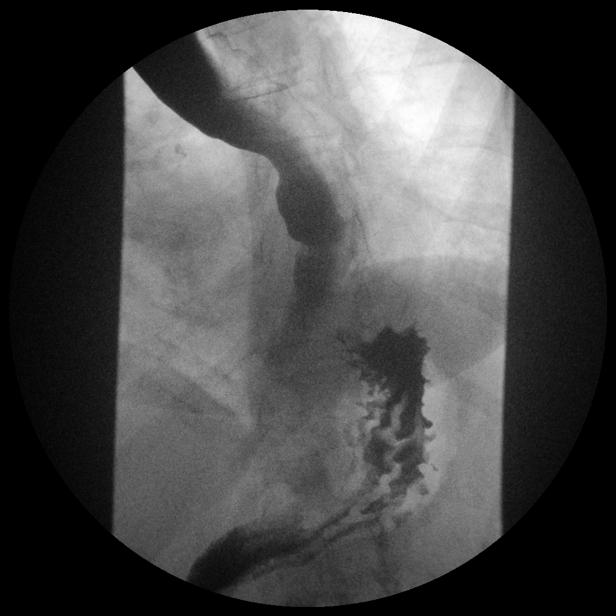

[Series 22: run · 1 of 1 slices shown (13 of 14)]
[im 1/1]
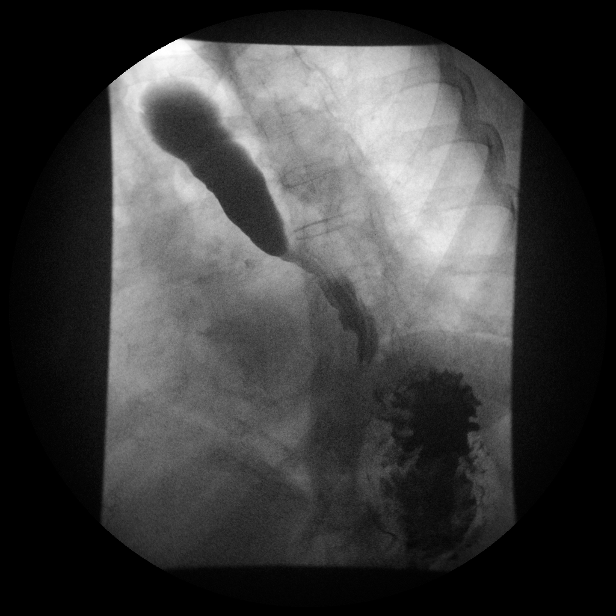

[Series 24: run · 1 of 1 slices shown (14 of 14)]
[im 1/1]
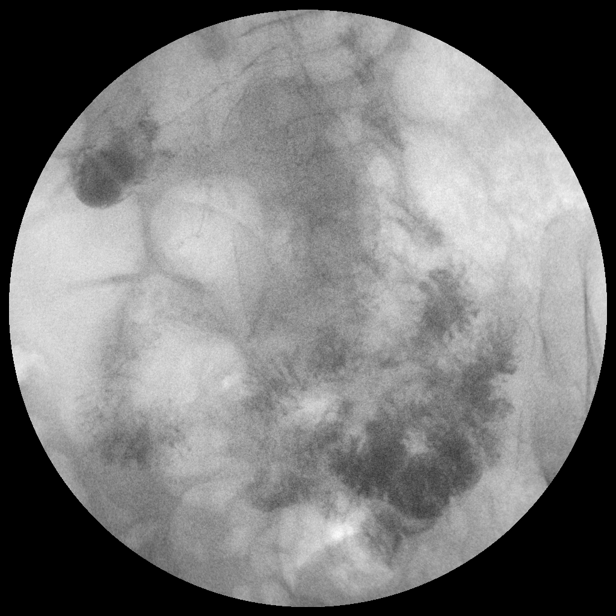

[14 of 24 positions shown; findings below may reference images not displayed]

FINDINGS: Study is limited by the patient's limited mobility and inability to
bear weight. Examination was performed in the semi erect and supine
positions.

On initial swallowing in the semi-erect position, there is a
decreased primary stripping wave and mild tertiary contractions
throughout the esophagus. The esophagus is mildly distended. There
is beaking of the distal esophagus at the gastroesophageal junction.
Despite repeated swallows, there is limited emptying of the
esophagus into the stomach.

Placing the patient supine and in the right lateral decubitus
positions did not promote emptying of the esophagus. Subsequently,
water was administered, and this did result in partial emptying into
the stomach. No mucosal lesions are observed.

A barium tablet was administered and was not observed to pass
through the distal esophagus or gastroesophageal junction. The
tablet was difficult to identify, largely obscured by residual
barium in the thoracic esophagus. It was not seen within the
stomach.

The patient developed coughing towards the and of the examination.
No aspiration into the tracheobronchial tree was observed.
Additional barium was not administered.
IMPRESSION: 1. Abnormal esophageal motility with decreased primary stripping
wave, increased tertiary contractions and beaking of the distal
esophagus. Findings are suspicious for achalasia.
2. No focal mucosal lesions identified.
3. Evaluation is limited by the patient's limited mobility.
Endoscopic evaluation should be considered.
